# Patient Record
Sex: Male | Born: 1950 | Race: White | Hispanic: No | Marital: Married | Smoking: Never smoker
Health system: Southern US, Community
[De-identification: ages and names within clinical notes are randomized; demographics above are authoritative.]

## PROBLEM LIST (undated history)

## (undated) DIAGNOSIS — T7840XA Allergy, unspecified, initial encounter: Secondary | ICD-10-CM

## (undated) DIAGNOSIS — I10 Essential (primary) hypertension: Secondary | ICD-10-CM

## (undated) DIAGNOSIS — K429 Umbilical hernia without obstruction or gangrene: Secondary | ICD-10-CM

## (undated) DIAGNOSIS — H3320 Serous retinal detachment, unspecified eye: Secondary | ICD-10-CM

## (undated) DIAGNOSIS — E785 Hyperlipidemia, unspecified: Secondary | ICD-10-CM

## (undated) DIAGNOSIS — I255 Ischemic cardiomyopathy: Secondary | ICD-10-CM

## (undated) DIAGNOSIS — Z789 Other specified health status: Secondary | ICD-10-CM

## (undated) DIAGNOSIS — T8859XA Other complications of anesthesia, initial encounter: Secondary | ICD-10-CM

## (undated) DIAGNOSIS — I2510 Atherosclerotic heart disease of native coronary artery without angina pectoris: Secondary | ICD-10-CM

## (undated) DIAGNOSIS — T4145XA Adverse effect of unspecified anesthetic, initial encounter: Secondary | ICD-10-CM

## (undated) DIAGNOSIS — H269 Unspecified cataract: Secondary | ICD-10-CM

## (undated) HISTORY — PX: RETINAL DETACHMENT SURGERY: SHX105

## (undated) HISTORY — DX: Other complications of anesthesia, initial encounter: T88.59XA

## (undated) HISTORY — DX: Unspecified cataract: H26.9

## (undated) HISTORY — DX: Ischemic cardiomyopathy: I25.5

## (undated) HISTORY — DX: Allergy, unspecified, initial encounter: T78.40XA

## (undated) HISTORY — PX: OTHER SURGICAL HISTORY: SHX169

## (undated) HISTORY — DX: Essential (primary) hypertension: I10

## (undated) HISTORY — PX: CARDIAC CATHETERIZATION: SHX172

## (undated) HISTORY — DX: Hyperlipidemia, unspecified: E78.5

## (undated) HISTORY — DX: Serous retinal detachment, unspecified eye: H33.20

## (undated) HISTORY — DX: Adverse effect of unspecified anesthetic, initial encounter: T41.45XA

## (undated) HISTORY — DX: Atherosclerotic heart disease of native coronary artery without angina pectoris: I25.10

---

## 2004-03-01 ENCOUNTER — Encounter: Payer: Self-pay | Admitting: Family Medicine

## 2004-03-01 LAB — CONVERTED CEMR LAB: PSA: 0.6 ng/mL

## 2004-08-15 ENCOUNTER — Ambulatory Visit: Payer: Self-pay | Admitting: Family Medicine

## 2004-08-18 ENCOUNTER — Ambulatory Visit: Payer: Self-pay | Admitting: Family Medicine

## 2004-10-02 ENCOUNTER — Ambulatory Visit: Payer: Self-pay | Admitting: Family Medicine

## 2004-11-16 ENCOUNTER — Ambulatory Visit: Payer: Self-pay | Admitting: Family Medicine

## 2004-11-24 ENCOUNTER — Ambulatory Visit: Payer: Self-pay | Admitting: Family Medicine

## 2005-02-21 ENCOUNTER — Ambulatory Visit: Payer: Self-pay | Admitting: Family Medicine

## 2005-03-02 ENCOUNTER — Ambulatory Visit: Payer: Self-pay | Admitting: Family Medicine

## 2005-04-02 ENCOUNTER — Ambulatory Visit: Payer: Self-pay | Admitting: Family Medicine

## 2006-07-18 ENCOUNTER — Ambulatory Visit: Payer: Self-pay | Admitting: Family Medicine

## 2006-09-30 ENCOUNTER — Emergency Department (HOSPITAL_COMMUNITY): Admission: EM | Admit: 2006-09-30 | Discharge: 2006-09-30 | Payer: Self-pay | Admitting: Emergency Medicine

## 2007-05-26 ENCOUNTER — Telehealth (INDEPENDENT_AMBULATORY_CARE_PROVIDER_SITE_OTHER): Payer: Self-pay | Admitting: *Deleted

## 2007-05-27 ENCOUNTER — Ambulatory Visit: Payer: Self-pay | Admitting: Family Medicine

## 2009-04-25 ENCOUNTER — Ambulatory Visit: Payer: Self-pay | Admitting: Family Medicine

## 2009-04-25 LAB — CONVERTED CEMR LAB
ALT: 22 units/L (ref 0–53)
AST: 21 units/L (ref 0–37)
Albumin: 4.3 g/dL (ref 3.5–5.2)
BUN: 15 mg/dL (ref 6–23)
CO2: 30 meq/L (ref 19–32)
Chloride: 103 meq/L (ref 96–112)
Cholesterol: 234 mg/dL — ABNORMAL HIGH (ref 0–200)
Creatinine, Ser: 1 mg/dL (ref 0.4–1.5)
Direct LDL: 175.3 mg/dL
Glucose, Bld: 101 mg/dL — ABNORMAL HIGH (ref 70–99)
Potassium: 4.1 meq/L (ref 3.5–5.1)
Total Bilirubin: 0.9 mg/dL (ref 0.3–1.2)
Total CHOL/HDL Ratio: 7
Total Protein: 7.1 g/dL (ref 6.0–8.3)

## 2009-04-27 ENCOUNTER — Encounter: Payer: Self-pay | Admitting: Family Medicine

## 2009-04-27 DIAGNOSIS — E785 Hyperlipidemia, unspecified: Secondary | ICD-10-CM | POA: Insufficient documentation

## 2009-04-27 DIAGNOSIS — N400 Enlarged prostate without lower urinary tract symptoms: Secondary | ICD-10-CM | POA: Insufficient documentation

## 2009-04-28 ENCOUNTER — Ambulatory Visit: Payer: Self-pay | Admitting: Family Medicine

## 2012-02-13 ENCOUNTER — Other Ambulatory Visit: Payer: Self-pay | Admitting: Family Medicine

## 2012-02-13 ENCOUNTER — Other Ambulatory Visit (INDEPENDENT_AMBULATORY_CARE_PROVIDER_SITE_OTHER): Payer: BC Managed Care – PPO

## 2012-02-13 DIAGNOSIS — Z0000 Encounter for general adult medical examination without abnormal findings: Secondary | ICD-10-CM

## 2012-02-13 DIAGNOSIS — E785 Hyperlipidemia, unspecified: Secondary | ICD-10-CM

## 2012-02-13 DIAGNOSIS — Z125 Encounter for screening for malignant neoplasm of prostate: Secondary | ICD-10-CM

## 2012-02-13 DIAGNOSIS — Z0001 Encounter for general adult medical examination with abnormal findings: Secondary | ICD-10-CM | POA: Insufficient documentation

## 2012-02-13 LAB — COMPREHENSIVE METABOLIC PANEL
ALT: 24 U/L (ref 0–53)
AST: 19 U/L (ref 0–37)
Calcium: 9.2 mg/dL (ref 8.4–10.5)
Chloride: 104 mEq/L (ref 96–112)
Creatinine, Ser: 1 mg/dL (ref 0.4–1.5)
Sodium: 140 mEq/L (ref 135–145)
Total Bilirubin: 0.9 mg/dL (ref 0.3–1.2)
Total Protein: 6.8 g/dL (ref 6.0–8.3)

## 2012-02-13 LAB — LIPID PANEL
HDL: 45.3 mg/dL (ref >39.00)
Total CHOL/HDL Ratio: 6
Triglycerides: 117 mg/dL (ref 0.0–149.0)

## 2012-02-18 ENCOUNTER — Encounter: Payer: Self-pay | Admitting: Family Medicine

## 2012-02-19 ENCOUNTER — Encounter: Payer: Self-pay | Admitting: Family Medicine

## 2012-02-19 ENCOUNTER — Ambulatory Visit (INDEPENDENT_AMBULATORY_CARE_PROVIDER_SITE_OTHER): Payer: BC Managed Care – PPO | Admitting: Family Medicine

## 2012-02-19 VITALS — BP 124/86 | HR 84 | Temp 98.2°F | Ht 71.0 in | Wt 212.8 lb

## 2012-02-19 DIAGNOSIS — R7309 Other abnormal glucose: Secondary | ICD-10-CM

## 2012-02-19 DIAGNOSIS — Z8249 Family history of ischemic heart disease and other diseases of the circulatory system: Secondary | ICD-10-CM

## 2012-02-19 DIAGNOSIS — Z1211 Encounter for screening for malignant neoplasm of colon: Secondary | ICD-10-CM

## 2012-02-19 DIAGNOSIS — R42 Dizziness and giddiness: Secondary | ICD-10-CM | POA: Insufficient documentation

## 2012-02-19 DIAGNOSIS — R739 Hyperglycemia, unspecified: Secondary | ICD-10-CM | POA: Insufficient documentation

## 2012-02-19 DIAGNOSIS — E785 Hyperlipidemia, unspecified: Secondary | ICD-10-CM

## 2012-02-19 DIAGNOSIS — Z0000 Encounter for general adult medical examination without abnormal findings: Secondary | ICD-10-CM

## 2012-02-19 NOTE — Assessment & Plan Note (Signed)
Isolated episode.  To update me if recurs. Encouraged good hydration status.

## 2012-02-19 NOTE — Assessment & Plan Note (Signed)
Preventative protocols reviewed and updated unless pt declined. Discussed healthy diet and lifestyle.  Declines flu shot. 

## 2012-02-19 NOTE — Assessment & Plan Note (Addendum)
Discussed low sugar diet.  Check A1c next visit. Discussed concern for prediabetes and improtance of weight loss.

## 2012-02-19 NOTE — Progress Notes (Signed)
Subjective:    Patient ID: Martin Garza, male    DOB: [birth year], 61 y.o.   MRN: [id number]  HPI CC: CPE  Dizzy spell the other day - sat down, drank glass of water, and felt better.  Described as lightheaded.  No presyncope or LOC.  Not associated with chest pain, HA, SOB.  Checked pulse, regular and normal.  Prior to dizziness, did walk up stairs.  Daughter with pacemaker.  Finding more trouble dealing with pressure/stress.  Wt Readings from Last 3 Encounters:  02/19/12 212 lb 12 oz (96.503 kg)  04/28/09 212 lb (96.163 kg)  05/27/07 209 lb (94.802 kg)    Preventative: Last CPE was 3 yrs ago. Colon cancer screening - no colonscopy.  Requests stool kit today. Prostate cancer screening - would like to continue. Declines flu shot. Tetanus 2005.  Tdap 08/2011  Strong fam hx CAD.  Caffeine: 3 cups coffee/day, some unsweet tea Lives with wife, 1 dog.  one grown daughter Occupation: Geophysical data processor Activity: no regular exercise.  yardwork. Diet: some water, fruits/vegetables daily, avoids fried foods  Medications and allergies reviewed and updated in chart.  Past histories reviewed and updated if relevant as below. Patient Active Problem List  Diagnosis  . HYPERLIPIDEMIA  . HYPERTROPHY PROSTATE W/O UR OBST & OTH LUTS  . Healthcare maintenance   No past medical history on file. Past Surgical History  Procedure Date  . Left inguinal repair in High School   History  Substance Use Topics  . Smoking status: Never Smoker   . Smokeless tobacco: Never Used  . Alcohol Use: Yes     Glass of wine/day   Family History  Problem Relation Age of Onset  . Cancer Mother 58    metastatic cancer, smoker  . Coronary artery disease Father 33    MI with CABG  . Stroke Father     after catheterization  . Alcohol abuse Father   . Alcohol abuse Brother   . Coronary artery disease Brother 40    CABG  . Depression Brother   . Drug abuse Brother   . Hearing loss Daughter   .  Cancer Maternal Aunt     breast  . Diabetes Neg Hx    No Known Allergies No current outpatient prescriptions on file prior to visit.     Review of Systems  Constitutional: Negative for fever, chills, activity change, appetite change, fatigue and unexpected weight change.  HENT: Negative for hearing loss and neck pain.   Eyes: Negative for visual disturbance.  Respiratory: Negative for cough, chest tightness, shortness of breath and wheezing.   Cardiovascular: Negative for chest pain, palpitations and leg swelling.  Gastrointestinal: Negative for nausea, vomiting, abdominal pain, diarrhea, constipation, blood in stool and abdominal distention.  Genitourinary: Negative for hematuria and difficulty urinating.  Musculoskeletal: Negative for myalgias and arthralgias.  Skin: Negative for rash.  Neurological: Positive for dizziness. Negative for seizures, syncope and headaches.  Hematological: Bruises/bleeds easily (but easily clots).  Psychiatric/Behavioral: Negative for dysphoric mood. The patient is not nervous/anxious.        Objective:   Physical Exam  Nursing note and vitals reviewed. Constitutional: He is oriented to person, place, and time. He appears well-developed and well-nourished. No distress.  HENT:  Head: Normocephalic and atraumatic.  Right Ear: External ear normal.  Left Ear: External ear normal.  Nose: Nose normal.  Mouth/Throat: Oropharynx is clear and moist. No oropharyngeal exudate.  Eyes: Conjunctivae normal and EOM are normal. Pupils are  equal, round, and reactive to light. No scleral icterus.  Neck: Normal range of motion. Neck supple. Carotid bruit is not present.  Cardiovascular: Normal rate, regular rhythm, normal heart sounds and intact distal pulses.   No murmur heard. Pulses:      Radial pulses are 2+ on the right side, and 2+ on the left side.  Pulmonary/Chest: Effort normal and breath sounds normal. No respiratory distress. He has no wheezes. He has no  rales.  Abdominal: Soft. Bowel sounds are normal. He exhibits no distension and no mass. There is no tenderness. There is no rebound and no guarding.  Musculoskeletal: Normal range of motion. He exhibits no edema.  Lymphadenopathy:    He has no cervical adenopathy.  Neurological: He is alert and oriented to person, place, and time.       CN grossly intact, station and gait intact  Skin: Skin is warm and dry. No rash noted.  Psychiatric: He has a normal mood and affect. His behavior is normal. Judgment and thought content normal.       Assessment & Plan:

## 2012-02-19 NOTE — Assessment & Plan Note (Signed)
rec start baby aspirin daily or QOD given fmhx.

## 2012-02-19 NOTE — Assessment & Plan Note (Signed)
Continued elevated.  Discussed low chol diet.  If not improved, start statin given fmhx.

## 2012-02-19 NOTE — Patient Instructions (Addendum)
Chol was too high - Remember - more fruits and vegetables, more fish, less red meat and dairy products.  More soy, nuts, beans, barley, lentils, oats and plant sterol ester enriched margarine instead of butter.  Low cholesterol diet hand out provided today. Sugar was borderline high - possible prediabetes. Start baby aspirin (81mg  enteric coated) daily or every other day. Good to see you today, call us with questions. Stool kit today.

## 2012-05-21 DIAGNOSIS — H3320 Serous retinal detachment, unspecified eye: Secondary | ICD-10-CM

## 2012-05-21 HISTORY — DX: Serous retinal detachment, unspecified eye: H33.20

## 2013-02-25 ENCOUNTER — Ambulatory Visit: Payer: Self-pay | Admitting: Ophthalmology

## 2013-04-02 ENCOUNTER — Ambulatory Visit: Payer: Self-pay | Admitting: Ophthalmology

## 2014-01-23 ENCOUNTER — Other Ambulatory Visit: Payer: Self-pay | Admitting: Family Medicine

## 2014-01-23 DIAGNOSIS — Z125 Encounter for screening for malignant neoplasm of prostate: Secondary | ICD-10-CM

## 2014-01-23 DIAGNOSIS — R739 Hyperglycemia, unspecified: Secondary | ICD-10-CM

## 2014-01-23 DIAGNOSIS — E785 Hyperlipidemia, unspecified: Secondary | ICD-10-CM

## 2014-01-26 ENCOUNTER — Other Ambulatory Visit: Payer: BC Managed Care – PPO

## 2014-01-27 ENCOUNTER — Other Ambulatory Visit (INDEPENDENT_AMBULATORY_CARE_PROVIDER_SITE_OTHER): Payer: Medicare Other

## 2014-01-27 DIAGNOSIS — E785 Hyperlipidemia, unspecified: Secondary | ICD-10-CM

## 2014-01-27 DIAGNOSIS — R7309 Other abnormal glucose: Secondary | ICD-10-CM

## 2014-01-27 DIAGNOSIS — Z125 Encounter for screening for malignant neoplasm of prostate: Secondary | ICD-10-CM

## 2014-01-27 DIAGNOSIS — R739 Hyperglycemia, unspecified: Secondary | ICD-10-CM

## 2014-01-27 LAB — LIPID PANEL
CHOLESTEROL: 255 mg/dL — AB (ref 0–200)
HDL: 38.7 mg/dL — AB (ref >39.00)
LDL CALC: 178 mg/dL — AB (ref 0–99)
NonHDL: 216.3
TRIGLYCERIDES: 194 mg/dL — AB (ref 0.0–149.0)
Total CHOL/HDL Ratio: 7
VLDL: 38.8 mg/dL (ref 0.0–40.0)

## 2014-01-27 LAB — BASIC METABOLIC PANEL
BUN: 21 mg/dL (ref 6–23)
CALCIUM: 8.9 mg/dL (ref 8.4–10.5)
CO2: 27 meq/L (ref 19–32)
Chloride: 105 mEq/L (ref 96–112)
Creatinine, Ser: 1.1 mg/dL (ref 0.4–1.5)
GFR: 75.33 mL/min (ref >60.00)
GLUCOSE: 94 mg/dL (ref 70–99)
Potassium: 4.2 mEq/L (ref 3.5–5.1)
SODIUM: 139 meq/L (ref 135–145)

## 2014-01-27 LAB — PSA: PSA: 0.92 ng/mL (ref 0.10–4.00)

## 2014-01-27 LAB — HEMOGLOBIN A1C: Hgb A1c MFr Bld: 5.6 % (ref 4.6–6.5)

## 2014-01-29 ENCOUNTER — Ambulatory Visit (INDEPENDENT_AMBULATORY_CARE_PROVIDER_SITE_OTHER): Payer: Medicare Other | Admitting: Family Medicine

## 2014-01-29 ENCOUNTER — Encounter: Payer: Self-pay | Admitting: Family Medicine

## 2014-01-29 VITALS — BP 132/78 | HR 92 | Temp 98.2°F | Ht 71.0 in | Wt 214.5 lb

## 2014-01-29 DIAGNOSIS — R739 Hyperglycemia, unspecified: Secondary | ICD-10-CM

## 2014-01-29 DIAGNOSIS — E785 Hyperlipidemia, unspecified: Secondary | ICD-10-CM

## 2014-01-29 DIAGNOSIS — R7309 Other abnormal glucose: Secondary | ICD-10-CM

## 2014-01-29 DIAGNOSIS — Z0000 Encounter for general adult medical examination without abnormal findings: Secondary | ICD-10-CM | POA: Insufficient documentation

## 2014-01-29 DIAGNOSIS — Z125 Encounter for screening for malignant neoplasm of prostate: Secondary | ICD-10-CM

## 2014-01-29 DIAGNOSIS — H3320 Serous retinal detachment, unspecified eye: Secondary | ICD-10-CM | POA: Insufficient documentation

## 2014-01-29 NOTE — Assessment & Plan Note (Signed)
I have personally reviewed the Medicare Annual Wellness questionnaire and have noted 1. The patient's medical and social history 2. Their use of alcohol, tobacco or illicit drugs 3. Their current medications and supplements 4. The patient's functional ability including ADL's, fall risks, home safety risks and hearing or visual impairment. 5. Diet and physical activity 6. Evidence for depression or mood disorders The patients weight, height, BMI have been recorded in the chart.  Hearing and vision has been addressed. I have made referrals, counseling and provided education to the patient based review of the above and I have provided the pt with a written personalized care plan for preventive services. Provider list updated - see scanned questionairre.  Reviewed preventative protocols and updated unless pt declined.  NSR rate 75, normal axis, intervals, no acute ST/T changes, QRS changes anterior leads presumed 2/2 lead placement not old infarct.

## 2014-01-29 NOTE — Progress Notes (Signed)
Pre visit review using our clinic review tool, if applicable. No additional management support is needed unless otherwise documented below in the visit note. 

## 2014-01-29 NOTE — Patient Instructions (Signed)
Stool kit today. Work on diet changes for lower cholesterol. Remember - more fruits and vegetables, more fish, less red meat and dairy products.  More soy, nuts, beans, barley, lentils, oats and plant sterol ester enriched margarine instead of butter. Return for lab visit in 3-6 months to recheck cholesterol levels - if persistently above goal, we may discuss cholesterol medicine given family history Good to see you today, call us with questions. Return as needed or in 1 year for next wellness visit.

## 2014-01-29 NOTE — Assessment & Plan Note (Signed)
Chronically uncontrolled. Pt states he has received low chol diet handout in past. Reviewed diet changes to help lower cholesterol levels. Discussed rtc 6 mo for recheck cholesterol levels and if remainign elevated rec start statin given fmhx.

## 2014-01-29 NOTE — Progress Notes (Addendum)
BP 132/78  Pulse 92  Temp(Src) 98.2 F (36.8 C) (Oral)  Ht _0  (1.803 m)  Wt 214 lb 8 oz (97.297 kg)  BMI 29.93 kg/m2   CC: welcome to medicare visit  Subjective:    Patient ID: Martin Garza, male    DOB: March 15, 1951, 63 y.o.   MRN: [id number]  HPI: Martin Garza is a 63 y.o. male presenting on 01/29/2014 for Annual Exam   Received medicare last month.  Regularly sees eye doctor. Issues with left eye: cataract + detached retina s/p repair x2 Passes hearing screen today Denies falls in last year No mood issues/depression/anhedonia  Preventative: Colon cancer screening - no colonscopy. Requests stool kit today.  Prostate cancer screening - would like to continue.  Declines flu shot Tetanus 2005. Tdap 08/2011  Strong fam hx CAD Advanced directives: addressed Seat belt use discussed Sunscreen use discussed, no suspicious moles  Caffeine: 3 cups coffee/day, some unsweet tea  Lives with wife, 1 dog. one grown daughter  Occupation: Adult nurse  Activity: no regular exercise. yardwork.  Diet: some water, fruits/vegetables daily, avoids fried foods   Relevant past medical, surgical, family and social history reviewed and updated as indicated.  Allergies and medications reviewed and updated. Current Outpatient Prescriptions on File Prior to Visit  Medication Sig  . Multiple Vitamins-Minerals (MULTIVITAMIN GUMMIES ADULT) CHEW Chew by mouth.   No current facility-administered medications on file prior to visit.    Review of Systems  Constitutional: Negative for fever, chills, activity change, appetite change, fatigue and unexpected weight change.  HENT: Negative for hearing loss.   Eyes: Positive for visual disturbance (see HPI).  Respiratory: Negative for cough, chest tightness, shortness of breath and wheezing.   Cardiovascular: Negative for chest pain, palpitations and leg swelling.  Gastrointestinal: Negative for nausea, vomiting, abdominal pain,  diarrhea, constipation, blood in stool and abdominal distention.  Genitourinary: Negative for hematuria and difficulty urinating.  Musculoskeletal: Negative for arthralgias, myalgias and neck pain.  Skin: Negative for rash.  Neurological: Negative for dizziness, seizures, syncope and headaches.  Hematological: Negative for adenopathy. Does not bruise/bleed easily.  Psychiatric/Behavioral: Negative for dysphoric mood. The patient is not nervous/anxious.    Per HPI unless specifically indicated above    Objective:    BP 132/78  Pulse 92  Temp(Src) 98.2 F (36.8 C) (Oral)  Ht _1  (1.803 m)  Wt 214 lb 8 oz (97.297 kg)  BMI 29.93 kg/m2  Physical Exam  Nursing note and vitals reviewed. Constitutional: He is oriented to person, place, and time. He appears well-developed and well-nourished. No distress.  HENT:  Head: Normocephalic and atraumatic.  Right Ear: Hearing, tympanic membrane, external ear and ear canal normal.  Left Ear: Hearing, tympanic membrane, external ear and ear canal normal.  Nose: Nose normal.  Mouth/Throat: Uvula is midline, oropharynx is clear and moist and mucous membranes are normal. No oropharyngeal exudate, posterior oropharyngeal edema or posterior oropharyngeal erythema.  Some thick cerumen in left canal  Eyes: Conjunctivae and EOM are normal. Pupils are equal, round, and reactive to light. No scleral icterus.  Neck: Normal range of motion. Neck supple. Carotid bruit is not present. No thyromegaly present.  Cardiovascular: Normal rate, regular rhythm, normal heart sounds and intact distal pulses.   No murmur heard. Pulses:      Radial pulses are 2+ on the right side, and 2+ on the left side.  Pulmonary/Chest: Effort normal and breath sounds normal. No respiratory distress. He has no wheezes.  He has no rales.  Abdominal: Soft. Bowel sounds are normal. He exhibits no distension and no mass. There is no tenderness. There is no rebound and no guarding.    Genitourinary: Rectum normal and prostate normal. Rectal exam shows no external hemorrhoid, no internal hemorrhoid, no fissure, no mass, no tenderness and anal tone normal. Prostate is not enlarged (15gm) and not tender.  Musculoskeletal: Normal range of motion. He exhibits no edema.  Lymphadenopathy:    He has no cervical adenopathy.  Neurological: He is alert and oriented to person, place, and time.  CN grossly intact, station and gait intact Calculation 3/5 serial 7s, 4/5 serial 3s  Skin: Skin is warm and dry. No rash noted.  Psychiatric: He has a normal mood and affect. His behavior is normal. Judgment and thought content normal.   Results for orders placed in visit on 01/27/14  LIPID PANEL      Result Value Ref Range   Cholesterol 255 (*) 0 - 200 mg/dL   Triglycerides 194.0 (*) 0.0 - 149.0 mg/dL   HDL 38.70 (*) >39.00 mg/dL   VLDL 38.8  0.0 - 40.0 mg/dL   LDL Cholesterol 178 (*) 0 - 99 mg/dL   Total CHOL/HDL Ratio 7     NonHDL 790.24    BASIC METABOLIC PANEL      Result Value Ref Range   Sodium 139  135 - 145 mEq/L   Potassium 4.2  3.5 - 5.1 mEq/L   Chloride 105  96 - 112 mEq/L   CO2 27  19 - 32 mEq/L   Glucose, Bld 94  70 - 99 mg/dL   BUN 21  6 - 23 mg/dL   Creatinine, Ser 1.1  0.4 - 1.5 mg/dL   Calcium 8.9  8.4 - 10.5 mg/dL   GFR 75.33  >60.00 mL/min  PSA      Result Value Ref Range   PSA 0.92  0.10 - 4.00 ng/mL  HEMOGLOBIN A1C      Result Value Ref Range   Hemoglobin A1C 5.6  4.6 - 6.5 %      Assessment & Plan:   Problem List Items Addressed This Visit   Welcome to Medicare preventive visit - Primary     I have personally reviewed the Medicare Annual Wellness questionnaire and have noted 1. The patient's medical and social history 2. Their use of alcohol, tobacco or illicit drugs 3. Their current medications and supplements 4. The patient's functional ability including ADL's, fall risks, home safety risks and hearing or visual impairment. 5. Diet and physical  activity 6. Evidence for depression or mood disorders The patients weight, height, BMI have been recorded in the chart.  Hearing and vision has been addressed. I have made referrals, counseling and provided education to the patient based review of the above and I have provided the pt with a written personalized care plan for preventive services. Provider list updated - see scanned questionairre.  Reviewed preventative protocols and updated unless pt declined.  NSR rate 75, normal axis, intervals, no acute ST/T changes, QRS changes anterior leads presumed 2/2 lead placement not old infarct.    Relevant Orders      EKG 12-Lead (Completed)   HYPERLIPIDEMIA     Chronically uncontrolled. Pt states he has received low chol diet handout in past. Reviewed diet changes to help lower cholesterol levels. Discussed rtc 6 mo for recheck cholesterol levels and if remainign elevated rec start statin given fmhx.     Other Visit  Diagnoses   Special screening for malignant neoplasm of prostate        Relevant Orders       Fecal occult blood, imunochemical    Hyperglycemia            Follow up plan: Return in about 1 year (around 01/30/2015), or as needed, for medicare wellness.

## 2014-01-29 NOTE — Assessment & Plan Note (Signed)
Improved

## 2014-02-03 ENCOUNTER — Ambulatory Visit: Payer: Self-pay | Admitting: Ophthalmology

## 2014-07-24 ENCOUNTER — Other Ambulatory Visit: Payer: Self-pay | Admitting: Family Medicine

## 2014-07-24 DIAGNOSIS — E785 Hyperlipidemia, unspecified: Secondary | ICD-10-CM

## 2014-07-30 ENCOUNTER — Other Ambulatory Visit: Payer: Medicare Other

## 2014-09-10 NOTE — Op Note (Signed)
PATIENT NAME:  Martin Garza, Martin Garza MR#:  [id number] DATE OF BIRTH:  [birth year]  DATE OF PROCEDURE:  04/02/2013  PROCEDURES PERFORMED: 1.  Pars plana vitrectomy of the left eye.  2.  Complex retinal detachment, repair of the left eye.   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  ESTIMATED BLOOD LOSS: Less than 1 mL.   PRIMARY SURGEON: Garlan Fair, M.D.   ANESTHESIA: Retrobulbar block of the left eye with monitored anesthesia care.   COMPLICATIONS: None.   INDICATIONS FOR PROCEDURE: This is a patient who presented to my office five weeks after retinal detachment repair. 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 Risks, benefits, and alternatives of the above procedure were discussed, and the patient wished to proceed.   DETAILS OF PROCEDURE: After informed consent was obtained, the patient was brought to the operative suite at [facility]. The patient was placed in the supine position, was given a small dose of Alfenta, and a retrobulbar block was performed on the left eye by the primary surgeon, without any complications. The left eye was prepped and draped in sterile manner. After lid speculum was inserted, a 25-gauge trocar was placed inferotemporally through displaced conjunctiva in an oblique fashion, 4 mm beyond the limbus. The infusion cannula was turned on and inserted through the trocar and secured in position with Steri-Strips. Two more trocars were placed in a similar fashion superotemporally and superonasally. The vitreous cutter and light pipe were introduced in the eye and the peripheral retina was explored. All retina anterior to the previous laser was attached.  There was a small retinal tear associated with some proliferative vitreoretinopathy inferiorly. Forceps were introduced, and the proliferative vitreoretinopathy began peeling. It became apparent that there was a large sheet that had developed inferiorly, covering most of the inferior retina. This was peeled away. Three tears developed, associated with the peeling. These tears were all marked with Endo cautery. A small draining retinotomy was also created posterior to one of these tears. An air-fluid exchange was performed. The retina flattened. Endolaser was introduced, and four rows of laser were placed around each of the tears. Laser was then carried throughout the inferior retina from the arcade out to the prior laser. Once this was completed, any remnant fluid was removed. 14% C3F8 was used as an Acupuncturist. The trocars were removed, and one trocar site was noted to be leaky, and was closed using interrupted 6-0 plain gut. The eye was pressurized with 14% C3F8 to 15 mmHg. 5 mg of dexamethasone was given into the inferior fornix, and the lid speculum was removed. The eye was cleaned and TobraDex was placed in the eye. A patch and shield were placed over the eye, and the patient was taken to postanesthesia care with instructions to remain face down.     ____________________________ Martin Garza. Martin Manns, MD mfa:cg D: 04/02/2013 17:18:48 ET T: 04/03/2013 03:30:27 ET JOB#: [id number]  cc: Martin Garza. Martin Manns, MD, <Dictator> Coralee Rud MD ELECTRONICALLY SIGNED 04/22/2013 8:37

## 2014-09-10 NOTE — Op Note (Signed)
PATIENT NAME:  Martin Garza, Martin Garza MR#:  [id number] DATE OF BIRTH:  10-Nov-1950  DATE OF PROCEDURE:  02/25/2013  PROCEDURES PERFORMED: 1.  Pars plana vitrectomy of the left eye.  2.  Gas exchange of the left eye.  3.  Endolaser left eye.   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   ESTIMATED BLOOD LOSS:  Less than 1 mL  PRIMARY SURGEON:  Garlan Fair, MD   ANESTHESIA: Retrobulbar block of the left eye with monitored anesthesia care.   COMPLICATIONS:  None.   INDICATIONS FOR PROCEDURE:  The patient presented to my office with a curtain and loss of central vision to 20/60 in the left eye. Examination and OCT confirmed a macula-off rhegmatogenous retinal detachment of the left eye. Risks, benefits and alternatives of the above procedure were discussed and the patient wished to proceed.   DETAILS OF PROCEDURE: After informed consent was obtained, the patient was brought in the operative suite at [facility]. The patient was placed in supine position, was given a small dose of Alfenta and a retrobulbar block was performed in the left eye by the primary surgeon without any complications. The left eye was prepped and draped in sterile manner. After lid speculum was inserted, a 25-gauge trocar was placed inferotemporally through displaced conjunctiva in an oblique fashion 4 mm beyond the limbus in the inferotemporal quadrant. The infusion cannula was turned on and inserted through the trocar and secured in position with Steri-Strips. Two more trocars were placed in a similar fashion superotemporally and superonasally. The vitreous cutter and light pipe were introduced to the eye and a core vitrectomy was performed. Vitreous face was confirmed as already elevated and the peripheral vitreous was trimmed for 360 degrees. A shaved mode was utilized  in order to trim vitreous over the retinal detachment. A single retinal tear was identified superotemporally and was amputated. After trimming of the vitreous, Endo cautery was introduced and the retinal tear was marked. A posterior draining retinotomy was created at approximately 1:30. The soft tip extrusion cannula was introduced and an air-fluid exchange was performed through the posterior draining retinotomy and the retina completely flattened. Endolaser was introduced and 4 rows of laser were placed around the original retinal tear and then the retinal laser was carried for 360 degrees to apply 3 to 4 rows of endolaser posterior to the ora serrata. The soft tip was used to drain any dehydrated remnant fluid through the posterior draining retinotomy and then 4 rows of laser was placed around the posterior draining retinotomy. 22% SF6 was used in air-gas exchange and the trocars were removed. Two of the sclerotomies required closure with 6-0 plain gut. The eye was pressurized with 22% SF6 and was pressurized to a pressure of approximately 15 mmHg. 5 mg of dexamethasone was given into the inferior fornix and the lid speculum was removed. The eye was cleaned and TobraDex was placed in the eye. A patch and shield was placed over the eye and the patient was taken to postanesthesia care with instruction to remain on his left side for 1 hour and then on his right side for the next 5 to 7 days.    ____________________________ Teresa Pelton. Starling Manns, MD mfa:ce D: 02/25/2013 11:27:29 ET T: 02/25/2013 11:39:29 ET JOB#: [id number]  cc: Teresa Pelton. Starling Manns, MD, <Dictator> Coralee Rud MD ELECTRONICALLY SIGNED 03/18/2013 7:08

## 2015-01-24 ENCOUNTER — Other Ambulatory Visit: Payer: Self-pay | Admitting: Family Medicine

## 2015-01-24 DIAGNOSIS — Z125 Encounter for screening for malignant neoplasm of prostate: Secondary | ICD-10-CM

## 2015-01-24 DIAGNOSIS — E785 Hyperlipidemia, unspecified: Secondary | ICD-10-CM

## 2015-01-28 ENCOUNTER — Other Ambulatory Visit (INDEPENDENT_AMBULATORY_CARE_PROVIDER_SITE_OTHER): Payer: Medicare Other

## 2015-01-28 DIAGNOSIS — E785 Hyperlipidemia, unspecified: Secondary | ICD-10-CM

## 2015-01-28 DIAGNOSIS — Z125 Encounter for screening for malignant neoplasm of prostate: Secondary | ICD-10-CM

## 2015-01-28 LAB — TSH: TSH: 1.05 u[IU]/mL (ref 0.35–4.50)

## 2015-01-28 LAB — BASIC METABOLIC PANEL
BUN: 19 mg/dL (ref 6–23)
CALCIUM: 9.5 mg/dL (ref 8.4–10.5)
CO2: 29 mEq/L (ref 19–32)
CREATININE: 1.03 mg/dL (ref 0.40–1.50)
Chloride: 105 mEq/L (ref 96–112)
GFR: 76.78 mL/min (ref >60.00)
GLUCOSE: 103 mg/dL — AB (ref 70–99)
POTASSIUM: 4.7 meq/L (ref 3.5–5.1)
Sodium: 141 mEq/L (ref 135–145)

## 2015-01-28 LAB — LIPID PANEL
CHOL/HDL RATIO: 6
Cholesterol: 245 mg/dL — ABNORMAL HIGH (ref 0–200)
HDL: 39.9 mg/dL (ref >39.00)
LDL CALC: 180 mg/dL — AB (ref 0–99)
NonHDL: 205.06
Triglycerides: 125 mg/dL (ref 0.0–149.0)
VLDL: 25 mg/dL (ref 0.0–40.0)

## 2015-01-28 LAB — PSA, MEDICARE: PSA: 0.72 ng/ml (ref 0.10–4.00)

## 2015-01-31 ENCOUNTER — Encounter: Payer: Self-pay | Admitting: Family Medicine

## 2015-01-31 ENCOUNTER — Ambulatory Visit (INDEPENDENT_AMBULATORY_CARE_PROVIDER_SITE_OTHER): Payer: Medicare Other | Admitting: Family Medicine

## 2015-01-31 VITALS — BP 110/80 | HR 80 | Temp 98.0°F | Ht 71.0 in | Wt 204.5 lb

## 2015-01-31 DIAGNOSIS — E785 Hyperlipidemia, unspecified: Secondary | ICD-10-CM

## 2015-01-31 DIAGNOSIS — Z8249 Family history of ischemic heart disease and other diseases of the circulatory system: Secondary | ICD-10-CM

## 2015-01-31 DIAGNOSIS — Z23 Encounter for immunization: Secondary | ICD-10-CM | POA: Diagnosis not present

## 2015-01-31 DIAGNOSIS — Z1211 Encounter for screening for malignant neoplasm of colon: Secondary | ICD-10-CM

## 2015-01-31 DIAGNOSIS — Z0000 Encounter for general adult medical examination without abnormal findings: Secondary | ICD-10-CM | POA: Diagnosis not present

## 2015-01-31 DIAGNOSIS — Z7189 Other specified counseling: Secondary | ICD-10-CM | POA: Insufficient documentation

## 2015-01-31 NOTE — Patient Instructions (Addendum)
Pass by lab to pick up stool kit.  prevnar today Advanced directive packet provided today. Think about cholesterol medicine.  Nice to see you today, call us with questions. Return as needed or in 1 year for next medicare wellness visit  Health Maintenance A healthy lifestyle and preventative care can promote health and wellness.  Maintain regular health, dental, and eye exams.  Eat a healthy diet. Foods like vegetables, fruits, whole grains, low-fat dairy products, and lean protein foods contain the nutrients you need and are low in calories. Decrease your intake of foods high in solid fats, added sugars, and salt. Get information about a proper diet from your health care provider, if necessary.  Regular physical exercise is one of the most important things you can do for your health. Most adults should get at least 150 minutes of moderate-intensity exercise (any activity that increases your heart rate and causes you to sweat) each week. In addition, most adults need muscle-strengthening exercises on 2 or more days a week.   Maintain a healthy weight. The body mass index (BMI) is a screening tool to identify possible weight problems. It provides an estimate of body fat based on height and weight. Your health care provider can find your BMI and can help you achieve or maintain a healthy weight. For males 20 years and older:  A BMI below 18.5 is considered underweight.  A BMI of 18.5 to 24.9 is normal.  A BMI of 25 to 29.9 is considered overweight.  A BMI of 30 and above is considered obese.  Maintain normal blood lipids and cholesterol by exercising and minimizing your intake of saturated fat. Eat a balanced diet with plenty of fruits and vegetables. Blood tests for lipids and cholesterol should begin at age 79 and be repeated every 5 years. If your lipid or cholesterol levels are high, you are over age 28, or you are at high risk for heart disease, you may need your cholesterol levels  checked more frequently.Ongoing high lipid and cholesterol levels should be treated with medicines if diet and exercise are not working.  If you smoke, find out from your health care provider how to quit. If you do not use tobacco, do not start.  Lung cancer screening is recommended for adults aged 1-80 years who are at high risk for developing lung cancer because of a history of smoking. A yearly low-dose CT scan of the lungs is recommended for people who have at least a 30-pack-year history of smoking and are current smokers or have quit within the past 15 years. A pack year of smoking is smoking an average of 1 pack of cigarettes a day for 1 year (for example, a 30-pack-year history of smoking could mean smoking 1 pack a day for 30 years or 2 packs a day for 15 years). Yearly screening should continue until the smoker has stopped smoking for at least 15 years. Yearly screening should be stopped for people who develop a health problem that would prevent them from having lung cancer treatment.  If you choose to drink alcohol, do not have more than 2 drinks per day. One drink is considered to be 12 oz (360 mL) of beer, 5 oz (150 mL) of wine, or 1.5 oz (45 mL) of liquor.  Avoid the use of street drugs. Do not share needles with anyone. Ask for help if you need support or instructions about stopping the use of drugs.  High blood pressure causes heart disease and increases the  risk of stroke. Blood pressure should be checked at least every 1-2 years. Ongoing high blood pressure should be treated with medicines if weight loss and exercise are not effective.  If you are 41-29 years old, ask your health care provider if you should take aspirin to prevent heart disease.  Diabetes screening involves taking a blood sample to check your fasting blood sugar level. This should be done once every 3 years after age 57 if you are at a normal weight and without risk factors for diabetes. Testing should be considered  at a younger age or be carried out more frequently if you are overweight and have at least 1 risk factor for diabetes.  Colorectal cancer can be detected and often prevented. Most routine colorectal cancer screening begins at the age of 75 and continues through age 13. However, your health care provider may recommend screening at an earlier age if you have risk factors for colon cancer. On a yearly basis, your health care provider may provide home test kits to check for hidden blood in the stool. A small camera at the end of a tube may be used to directly examine the colon (sigmoidoscopy or colonoscopy) to detect the earliest forms of colorectal cancer. Talk to your health care provider about this at age 23 when routine screening begins. A direct exam of the colon should be repeated every 5-10 years through age 48, unless early forms of precancerous polyps or small growths are found.  People who are at an increased risk for hepatitis B should be screened for this virus. You are considered at high risk for hepatitis B if:  You were born in a country where hepatitis B occurs often. Talk with your health care provider about which countries are considered high risk.  Your parents were born in a high-risk country and you have not received a shot to protect against hepatitis B (hepatitis B vaccine).  You have HIV or AIDS.  You use needles to inject street drugs.  You live with, or have sex with, someone who has hepatitis B.  You are a man who has sex with other men (MSM).  You get hemodialysis treatment.  You take certain medicines for conditions like cancer, organ transplantation, and autoimmune conditions.  Hepatitis C blood testing is recommended for all people born from [birth year] through 1965 and any individual with known risk factors for hepatitis C.  Healthy men should no longer receive prostate-specific antigen (PSA) blood tests as part of routine cancer screening. Talk to your health care  provider about prostate cancer screening.  Testicular cancer screening is not recommended for adolescents or adult males who have no symptoms. Screening includes self-exam, a health care provider exam, and other screening tests. Consult with your health care provider about any symptoms you have or any concerns you have about testicular cancer.  Practice safe sex. Use condoms and avoid high-risk sexual practices to reduce the spread of sexually transmitted infections (STIs).  You should be screened for STIs, including gonorrhea and chlamydia if:  You are sexually active and are younger than 24 years.  You are older than 24 years, and your health care provider tells you that you are at risk for this type of infection.  Your sexual activity has changed since you were last screened, and you are at an increased risk for chlamydia or gonorrhea. Ask your health care provider if you are at risk.  If you are at risk of being infected with HIV, it is  recommended that you take a prescription medicine daily to prevent HIV infection. This is called pre-exposure prophylaxis (PrEP). You are considered at risk if:  You are a man who has sex with other men (MSM).  You are a heterosexual man who is sexually active with multiple partners.  You take drugs by injection.  You are sexually active with a partner who has HIV.  Talk with your health care provider about whether you are at high risk of being infected with HIV. If you choose to begin PrEP, you should first be tested for HIV. You should then be tested every 3 months for as long as you are taking PrEP.  Use sunscreen. Apply sunscreen liberally and repeatedly throughout the day. You should seek shade when your shadow is shorter than you. Protect yourself by wearing long sleeves, pants, a wide-brimmed hat, and sunglasses year round whenever you are outdoors.  Tell your health care provider of new moles or changes in moles, especially if there is a change  in shape or color. Also, tell your health care provider if a mole is larger than the size of a pencil eraser.  A one-time screening for abdominal aortic aneurysm (AAA) and surgical repair of large AAAs by ultrasound is recommended for men aged 12-75 years who are current or former smokers.  Stay current with your vaccines (immunizations). Document Released: 11/03/2007 Document Revised: 05/12/2013 Document Reviewed: 10/02/2010 [facility] Patient Information 2015 [place], [place]. This information is not intended to replace advice given to you by your health care provider. Make sure you discuss any questions you have with your health care provider.

## 2015-01-31 NOTE — Assessment & Plan Note (Signed)

## 2015-01-31 NOTE — Assessment & Plan Note (Signed)
Not currently taking aspirin.

## 2015-01-31 NOTE — Assessment & Plan Note (Signed)
Reviewed with patient #s again. Recommended start statin esp given fmhx. Pt hesitant, but will let me know if decides to start medication to control cholesterol levels.

## 2015-01-31 NOTE — Progress Notes (Signed)
Pre visit review using our clinic review tool, if applicable. No additional management support is needed unless otherwise documented below in the visit note. 

## 2015-01-31 NOTE — Assessment & Plan Note (Signed)
Advanced directives - would want wife to be HCPOA. Has not set up. Packet provided today

## 2015-01-31 NOTE — Progress Notes (Signed)
BP 110/80 mmHg  Pulse 80  Temp(Src) 98 F (36.7 C) (Oral)  Ht $R'5\' 11"'Nt$  (1.803 m)  Wt 204 lb 8 oz (92.761 kg)  BMI 28.53 kg/m2   CC: medicare wellness visit  Subjective:    Patient ID: Martin Garza, male    DOB: 03-04-1951, 64 y.o.   MRN: [id number]  HPI: Martin Garza is a 65 y.o. male presenting on 01/31/2015 for Annual Exam   HLD - has tried lipitor and another medication in the past and did not tolerate this.   Regularly sees eye doctor. Issues with left eye: cataract + detached retina s/p repair x2 Passes hearing screen today Denies falls in last year No mood issues/depression/anhedonia  Preventative: Colon cancer screening - no colonscopy. Requests stool kit today.  Prostate cancer screening - would like to continue.  Declines flu shot Tdap 08/2011.  Prevnar - declines zostavax - declines Strong fam hx CAD Advanced directives - would want wife to be HCPOA. Has not set up. Packet provided today Seat belt use discussed Sunscreen use discussed, no suspicious moles  Caffeine: 3 cups coffee/day, some unsweet tea  Lives with wife, 1 dog. one grown daughter  Occupation: Adult nurse  Activity: no regular exercise. yardwork.  Diet: some water, fruits/vegetables daily, avoids fried foods and simple carbs   Relevant past medical, surgical, family and social history reviewed and updated as indicated. Interim medical history since our last visit reviewed. Allergies and medications reviewed and updated. Current Outpatient Prescriptions on File Prior to Visit  Medication Sig  . Multiple Vitamins-Minerals (MULTIVITAMIN GUMMIES ADULT) CHEW Chew by mouth.   No current facility-administered medications on file prior to visit.    Review of Systems Per HPI unless specifically indicated above     Objective:    BP 110/80 mmHg  Pulse 80  Temp(Src) 98 F (36.7 C) (Oral)  Ht $R'5\' 11"'KM$  (1.803 m)  Wt 204 lb 8 oz (92.761 kg)  BMI 28.53 kg/m2  Wt Readings from  Last 3 Encounters:  01/31/15 204 lb 8 oz (92.761 kg)  01/29/14 214 lb 8 oz (97.297 kg)  02/19/12 212 lb 12 oz (96.503 kg)    Physical Exam  Constitutional: He is oriented to person, place, and time. He appears well-developed and well-nourished. No distress.  HENT:  Head: Normocephalic and atraumatic.  Right Ear: Hearing, tympanic membrane, external ear and ear canal normal.  Left Ear: Hearing, tympanic membrane, external ear and ear canal normal.  Nose: Nose normal.  Mouth/Throat: Uvula is midline, oropharynx is clear and moist and mucous membranes are normal. No oropharyngeal exudate, posterior oropharyngeal edema or posterior oropharyngeal erythema.  Eyes: Conjunctivae and EOM are normal. Pupils are equal, round, and reactive to light. No scleral icterus.  Neck: Normal range of motion. Neck supple. Carotid bruit is not present. No thyromegaly present.  Cardiovascular: Normal rate, regular rhythm, normal heart sounds and intact distal pulses.   No murmur heard. Pulses:      Radial pulses are 2+ on the right side, and 2+ on the left side.  Pulmonary/Chest: Effort normal and breath sounds normal. No respiratory distress. He has no wheezes. He has no rales.  Abdominal: Soft. Bowel sounds are normal. He exhibits no distension and no mass. There is no tenderness. There is no rebound and no guarding.  Genitourinary: Rectum normal and prostate normal. Rectal exam shows no external hemorrhoid, no internal hemorrhoid, no fissure, no mass, no tenderness and anal tone normal. Prostate is not enlarged (15gm)  and not tender.  Musculoskeletal: Normal range of motion. He exhibits no edema.  Lymphadenopathy:    He has no cervical adenopathy.  Neurological: He is alert and oriented to person, place, and time.  CN grossly intact, station and gait intact  Skin: Skin is warm and dry. No rash noted.  Psychiatric: He has a normal mood and affect. His behavior is normal. Judgment and thought content normal.    Nursing note and vitals reviewed.  Results for orders placed or performed in visit on 01/28/15  Lipid panel  Result Value Ref Range   Cholesterol 245 (H) 0 - 200 mg/dL   Triglycerides 125.0 0.0 - 149.0 mg/dL   HDL 39.90 >39.00 mg/dL   VLDL 25.0 0.0 - 40.0 mg/dL   LDL Cholesterol 180 (H) 0 - 99 mg/dL   Total CHOL/HDL Ratio 6    NonHDL 205.06   TSH  Result Value Ref Range   TSH 1.05 0.35 - 4.50 uIU/mL  Basic metabolic panel  Result Value Ref Range   Sodium 141 135 - 145 mEq/L   Potassium 4.7 3.5 - 5.1 mEq/L   Chloride 105 96 - 112 mEq/L   CO2 29 19 - 32 mEq/L   Glucose, Bld 103 (H) 70 - 99 mg/dL   BUN 19 6 - 23 mg/dL   Creatinine, Ser 1.03 0.40 - 1.50 mg/dL   Calcium 9.5 8.4 - 10.5 mg/dL   GFR 76.78 >60.00 mL/min  PSA, Medicare  Result Value Ref Range   PSA 0.72 0.10 - 4.00 ng/ml      Assessment & Plan:   Problem List Items Addressed This Visit    HLD (hyperlipidemia)    Reviewed with patient #s again. Recommended start statin esp given fmhx. Pt hesitant, but will let me know if decides to start medication to control cholesterol levels.      Family history of early CAD    Not currently taking aspirin.      Medicare annual wellness visit, initial - Primary    I have personally reviewed the Medicare Annual Wellness questionnaire and have noted 1. The patient's medical and social history 2. Their use of alcohol, tobacco or illicit drugs 3. Their current medications and supplements 4. The patient's functional ability including ADL's, fall risks, home safety risks and hearing or visual impairment. Cognitive function has been assessed and addressed as indicated.  5. Diet and physical activity 6. Evidence for depression or mood disorders The patients weight, height, BMI have been recorded in the chart. I have made referrals, counseling and provided education to the patient based on review of the above and I have provided the pt with a written personalized care plan for  preventive services. Provider list updated.. See scanned questionairre as needed for further documentation. Reviewed preventative protocols and updated unless pt declined.       Advanced care planning/counseling discussion    Advanced directives - would want wife to be HCPOA. Has not set up. Packet provided today       Other Visit Diagnoses    Special screening for malignant neoplasms, colon        Relevant Orders    Fecal occult blood, imunochemical        Follow up plan: Return in about 1 year (around 01/31/2016), or as needed, for medicare wellness.

## 2015-01-31 NOTE — Addendum Note (Signed)
Addended by: Royann Shivers A on: 01/31/2015 05:51 PM   Modules accepted: Orders

## 2016-01-31 ENCOUNTER — Other Ambulatory Visit: Payer: Self-pay | Admitting: Family Medicine

## 2016-01-31 DIAGNOSIS — E785 Hyperlipidemia, unspecified: Secondary | ICD-10-CM

## 2016-01-31 DIAGNOSIS — Z1159 Encounter for screening for other viral diseases: Secondary | ICD-10-CM

## 2016-01-31 DIAGNOSIS — Z125 Encounter for screening for malignant neoplasm of prostate: Secondary | ICD-10-CM

## 2016-02-01 ENCOUNTER — Ambulatory Visit (INDEPENDENT_AMBULATORY_CARE_PROVIDER_SITE_OTHER): Payer: PPO

## 2016-02-01 ENCOUNTER — Other Ambulatory Visit (INDEPENDENT_AMBULATORY_CARE_PROVIDER_SITE_OTHER): Payer: PPO

## 2016-02-01 VITALS — BP 122/80 | HR 61 | Temp 97.7°F | Ht 69.75 in | Wt 210.2 lb

## 2016-02-01 DIAGNOSIS — Z0000 Encounter for general adult medical examination without abnormal findings: Secondary | ICD-10-CM | POA: Diagnosis not present

## 2016-02-01 DIAGNOSIS — Z1159 Encounter for screening for other viral diseases: Secondary | ICD-10-CM | POA: Diagnosis not present

## 2016-02-01 DIAGNOSIS — E785 Hyperlipidemia, unspecified: Secondary | ICD-10-CM

## 2016-02-01 DIAGNOSIS — Z125 Encounter for screening for malignant neoplasm of prostate: Secondary | ICD-10-CM | POA: Diagnosis not present

## 2016-02-01 DIAGNOSIS — Z23 Encounter for immunization: Secondary | ICD-10-CM

## 2016-02-01 LAB — LIPID PANEL
CHOLESTEROL: 262 mg/dL — AB (ref 0–200)
HDL: 44.5 mg/dL (ref >39.00)
LDL Cholesterol: 192 mg/dL — ABNORMAL HIGH (ref 0–99)
NONHDL: 217.35
TRIGLYCERIDES: 126 mg/dL (ref 0.0–149.0)
Total CHOL/HDL Ratio: 6
VLDL: 25.2 mg/dL (ref 0.0–40.0)

## 2016-02-01 LAB — BASIC METABOLIC PANEL
BUN: 20 mg/dL (ref 6–23)
CALCIUM: 9.1 mg/dL (ref 8.4–10.5)
CHLORIDE: 106 meq/L (ref 96–112)
CO2: 28 meq/L (ref 19–32)
CREATININE: 1.02 mg/dL (ref 0.40–1.50)
GFR: 77.41 mL/min (ref >60.00)
GLUCOSE: 106 mg/dL — AB (ref 70–99)
Potassium: 4.6 mEq/L (ref 3.5–5.1)
Sodium: 141 mEq/L (ref 135–145)

## 2016-02-01 LAB — PSA, MEDICARE: PSA: 0.96 ng/ml (ref 0.10–4.00)

## 2016-02-01 NOTE — Progress Notes (Signed)
Pre visit review using our clinic review tool, if applicable. No additional management support is needed unless otherwise documented below in the visit note. 

## 2016-02-01 NOTE — Progress Notes (Signed)
PCP notes:   Health maintenance:  PPSV23 - administered Hep C screening - completed Colon cancer screening - pt will get FOBT kit at [facility] Shingles - postponed/insurance  Abnormal screenings:   Hearing - failed  Patient concerns:   Pt reports intermittent right foot pain when walking. Onset approx. 6 mths ago.  Nurse concerns:  None  Next PCP appt:   02/09/2016 @ 1600

## 2016-02-01 NOTE — Patient Instructions (Addendum)
Martin Garza , Thank you for taking time to come for your Medicare Wellness Visit. I appreciate your ongoing commitment to your health goals. Please review the following plan we discussed and let me know if I can assist you in the future.   These are the goals we discussed: Goals    . Increase physical activity          Starting 02/01/2016, I will continue to walk at least 4 miles daily.        This is a list of the screening recommended for you and due dates:  Health Maintenance  Topic Date Due  . Stool Blood Test  05/20/2016*  . Shingles Vaccine  01/31/2017*  . Flu Shot  01/31/2026*  . Colon Cancer Screening  01/31/2026*  . DTaP/Tdap/Td vaccine (2 - Td) 08/28/2021  . Tetanus Vaccine  08/28/2021  .  Hepatitis C: One time screening is recommended by Center for Disease Control  (CDC) for  adults born from [birth year] through 1967.   Completed  . Pneumonia vaccines  Completed  *Topic was postponed. The date shown is not the original due date.   Preventive Care for Adults  A healthy lifestyle and preventive care can promote health and wellness. Preventive health guidelines for adults include the following key practices.  . A routine yearly physical is a good way to check with your health care provider about your health and preventive screening. It is a chance to share any concerns and updates on your health and to receive a thorough exam.  . Visit your dentist for a routine exam and preventive care every 6 months. Brush your teeth twice a day and floss once a day. Good oral hygiene prevents tooth decay and gum disease.  . The frequency of eye exams is based on your age, health, family medical history, use  of contact lenses, and other factors. Follow your health care provider's ecommendations for frequency of eye exams.  . Eat a healthy diet. Foods like vegetables, fruits, whole grains, low-fat dairy products, and lean protein foods contain the nutrients you need without too many calories.  Decrease your intake of foods high in solid fats, added sugars, and salt. Eat the right amount of calories for you. Get information about a proper diet from your health care provider, if necessary.  . Regular physical exercise is one of the most important things you can do for your health. Most adults should get at least 150 minutes of moderate-intensity exercise (any activity that increases your heart rate and causes you to sweat) each week. In addition, most adults need muscle-strengthening exercises on 2 or more days a week.  Silver Sneakers may be a benefit available to you. To determine eligibility, you may visit the website: www.silversneakers.com or contact program at [phone] Mon-Fri between 8AM-8PM.   . Maintain a healthy weight. The body mass index (BMI) is a screening tool to identify possible weight problems. It provides an estimate of body fat based on height and weight. Your health care provider can find your BMI and can help you achieve or maintain a healthy weight.   For adults 20 years and older: ? A BMI below 18.5 is considered underweight. ? A BMI of 18.5 to 24.9 is normal. ? A BMI of 25 to 29.9 is considered overweight. ? A BMI of 30 and above is considered obese.   . Maintain normal blood lipids and cholesterol levels by exercising and minimizing your intake of saturated fat. Eat a balanced diet  with plenty of fruit and vegetables. Blood tests for lipids and cholesterol should begin at age 65 and be repeated every 5 years. If your lipid or cholesterol levels are high, you are over 50, or you are at high risk for heart disease, you may need your cholesterol levels checked more frequently. Ongoing high lipid and cholesterol levels should be treated with medicines if diet and exercise are not working.  . If you smoke, find out from your health care provider how to quit. If you do not use tobacco, please do not start.  . If you choose to drink alcohol, please do not consume  more than 2 drinks per day. One drink is considered to be 12 ounces (355 mL) of beer, 5 ounces (148 mL) of wine, or 1.5 ounces (44 mL) of liquor.  . If you are 101-35 years old, ask your health care provider if you should take aspirin to prevent strokes.  . Use sunscreen. Apply sunscreen liberally and repeatedly throughout the day. You should seek shade when your shadow is shorter than you. Protect yourself by wearing long sleeves, pants, a wide-brimmed hat, and sunglasses year round, whenever you are outdoors.  . Once a month, do a whole body skin exam, using a mirror to look at the skin on your back. Tell your health care provider of new moles, moles that have irregular borders, moles that are larger than a pencil eraser, or moles that have changed in shape or color.

## 2016-02-01 NOTE — Progress Notes (Signed)
   Subjective:    Patient ID: Martin Garza, male    DOB: 08-20-1950, 65 y.o.   MRN: [id number]  HPI I reviewed health advisor's note, was available for consultation, and agree with documentation and plan.    Review of Systems     Objective:   Physical Exam        Assessment & Plan:

## 2016-02-01 NOTE — Progress Notes (Signed)
Subjective:   Martin Garza is a 65 y.o. male who presents for Medicare Annual/Subsequent preventive examination.  Review of Systems:  N/A Cardiac Risk Factors include: advanced age (>35men, >76 women);male gender;dyslipidemia;obesity (BMI >30kg/m2)     Objective:    Vitals: BP 122/80 (BP Location: Left Arm, Patient Position: Sitting, Cuff Size: Normal)   Pulse 61   Temp 97.7 F (36.5 C) (Oral)   Ht 5' 9.75" (1.772 m) Comment: no shoes  Wt 210 lb 4 oz (95.4 kg)   SpO2 98%   BMI 30.38 kg/m   Body mass index is 30.38 kg/m.  Tobacco History  Smoking Status  . Never Smoker  Smokeless Tobacco  . Never Used     Counseling given: No   Past Medical History:  Diagnosis Date  . Retinal detachment 2014   left   Past Surgical History:  Procedure Laterality Date  . Left inguinal repair  in High School  . RETINAL DETACHMENT SURGERY Left    x 2   Family History  Problem Relation Age of Onset  . Cancer Mother 70    metastatic cancer, smoker  . Coronary artery disease Father 50    MI with CABG, smoker  . Stroke Father     after catheterization  . Alcohol abuse Father   . Alcohol abuse Brother   . Coronary artery disease Brother 31    CABG, smoker  . Depression Brother   . Drug abuse Brother   . Hearing loss Daughter   . Cancer Maternal Aunt     breast  . Diabetes Neg Hx    History  Sexual Activity  . Sexual activity: Yes    Outpatient Encounter Prescriptions as of 02/01/2016  Medication Sig  . Multiple Vitamins-Minerals (MULTIVITAMIN GUMMIES ADULT) CHEW Chew by mouth.   No facility-administered encounter medications on file as of 02/01/2016.     Activities of Daily Living In your present state of health, do you have any difficulty performing the following activities: 02/01/2016  Hearing? N  Vision? Y  Difficulty concentrating or making decisions? N  Walking or climbing stairs? N  Dressing or bathing? N  Doing errands, shopping? N  Preparing Food and  eating ? N  Using the Toilet? N  In the past six months, have you accidently leaked urine? N  Do you have problems with loss of bowel control? N  Managing your Medications? N  Managing your Finances? N  Housekeeping or managing your Housekeeping? N  Some recent data might be hidden    Patient Care Team: Ria Bush, MD as PCP - General (Family Medicine) Leandrew Koyanagi, MD as Referring Physician (Ophthalmology)   Assessment:     Hearing Screening   125Hz  250Hz  500Hz  1000Hz  2000Hz  3000Hz  4000Hz  6000Hz  8000Hz   Right ear:   40 0 40  0    Left ear:   40 40 40  0    Vision Screening Comments: Last vision exam with Dr. Wallace Going in 2017   Exercise Activities and Dietary recommendations Current Exercise Habits: Home exercise routine, Type of exercise: walking (walks 4 miles daily), Time (Minutes): 60, Frequency (Times/Week): 7, Weekly Exercise (Minutes/Week): 420, Intensity: Mild, Exercise limited by: None identified  Goals    . Increase physical activity          Starting 02/01/2016, I will continue to walk at least 4 miles daily.       Fall Risk Fall Risk  02/01/2016 01/31/2015 01/29/2014  Falls in the past year?  No No No   Depression Screen PHQ 2/9 Scores 02/01/2016 01/31/2015 01/29/2014  PHQ - 2 Score 0 0 0    Cognitive Testing MMSE - Mini Mental State Exam 02/01/2016  Orientation to time 5  Orientation to Place 5  Registration 3  Attention/ Calculation 0  Recall 3  Language- name 2 objects 0  Language- repeat 1  Language- follow 3 step command 3  Language- read & follow direction 0  Write a sentence 0  Copy design 0  Total score 20   PLEASE NOTE: A Mini-Cog screen was completed. Maximum score is 20. A value of 0 denotes this part of Folstein MMSE was not completed or the patient failed this part of the Mini-Cog screening.   Mini-Cog Screening Orientation to Time - Max 5 pts Orientation to Place - Max 5 pts Registration - Max 3 pts Recall - Max 3  pts Language Repeat - Max 1 pts Language Follow 3 Step Command - Max 3 pts   Immunization History  Administered Date(s) Administered  . Pneumococcal Conjugate-13 01/31/2015  . Pneumococcal Polysaccharide-23 02/01/2016  . Td 02/24/2004  . Tdap 08/29/2011   Screening Tests Health Maintenance  Topic Date Due  . COLON CANCER SCREENING ANNUAL FOBT  05/20/2016 (Originally 01/04/1999)  . ZOSTAVAX  01/31/2017 (Originally 01/03/2009)  . INFLUENZA VACCINE  01/31/2026 (Originally 12/20/2015)  . COLONOSCOPY  01/31/2026 (Originally 01/04/1999)  . DTaP/Tdap/Td (2 - Td) 08/28/2021  . TETANUS/TDAP  08/28/2021  . Hepatitis C Screening  Completed  . PNA vac Low Risk Adult  Completed      Plan:     I have personally reviewed and addressed the Medicare Annual Wellness questionnaire and have noted the following in the patient's chart:  A. Medical and social history B. Use of alcohol, tobacco or illicit drugs  C. Current medications and supplements D. Functional ability and status E.  Nutritional status F.  Physical activity G. Advance directives H. List of other physicians I.  Hospitalizations, surgeries, and ER visits in previous 12 months J.  [place] to include hearing, vision, cognitive, depression L. Referrals and appointments - none  In addition, I have reviewed and discussed with patient certain preventive protocols, quality metrics, and best practice recommendations. A written personalized care plan for preventive services as well as general preventive health recommendations were provided to patient.  See attached scanned questionnaire for additional information.   Signed,   Lindell Noe, MHA, BS, LPN Health Advisor

## 2016-02-02 LAB — HEPATITIS C ANTIBODY: HCV AB: NEGATIVE

## 2016-02-09 ENCOUNTER — Encounter: Payer: Self-pay | Admitting: Family Medicine

## 2016-02-09 ENCOUNTER — Ambulatory Visit (INDEPENDENT_AMBULATORY_CARE_PROVIDER_SITE_OTHER): Payer: PPO | Admitting: Family Medicine

## 2016-02-09 VITALS — BP 128/80 | HR 76 | Temp 98.3°F | Ht 70.5 in | Wt 215.5 lb

## 2016-02-09 DIAGNOSIS — Z7189 Other specified counseling: Secondary | ICD-10-CM

## 2016-02-09 DIAGNOSIS — E785 Hyperlipidemia, unspecified: Secondary | ICD-10-CM

## 2016-02-09 DIAGNOSIS — Z8249 Family history of ischemic heart disease and other diseases of the circulatory system: Secondary | ICD-10-CM

## 2016-02-09 DIAGNOSIS — M79671 Pain in right foot: Secondary | ICD-10-CM | POA: Insufficient documentation

## 2016-02-09 DIAGNOSIS — Z0001 Encounter for general adult medical examination with abnormal findings: Secondary | ICD-10-CM

## 2016-02-09 DIAGNOSIS — R6889 Other general symptoms and signs: Secondary | ICD-10-CM | POA: Diagnosis not present

## 2016-02-09 DIAGNOSIS — K429 Umbilical hernia without obstruction or gangrene: Secondary | ICD-10-CM | POA: Insufficient documentation

## 2016-02-09 MED ORDER — ASPIRIN EC 81 MG PO TBEC: 81.0000 mg | DELAYED_RELEASE_TABLET | Freq: Every day | ORAL | Status: DC

## 2016-02-09 MED ORDER — ATORVASTATIN CALCIUM 20 MG PO TABS: 20.0000 mg | ORAL_TABLET | Freq: Every day | ORAL | 11 refills | Status: DC

## 2016-02-09 NOTE — Patient Instructions (Addendum)
Pass by lab for stool kit.  Increase water intake.  Work on advanced directives and bring [place] copy when complete.  I recommend starting aspirin '81mg'$  a few times a week. I recommend starting cholesterol medicine atorvastatin '20mg'$  MWF to see if tolerated. I have sent this to your pharmacy.  Watch R foot pain - if not improving with time and rest let me know for xray. Return as needed or in 1 year for physical.   Health Maintenance, Male A healthy lifestyle and preventative care can promote health and wellness.  Maintain regular health, dental, and eye exams.  Eat a healthy diet. Foods like vegetables, fruits, whole grains, low-fat dairy products, and lean protein foods contain the nutrients you need and are low in calories. Decrease your intake of foods high in solid fats, added sugars, and salt. Get information about a proper diet from your health care provider, if necessary.  Regular physical exercise is one of the most important things you can do for your health. Most adults should get at least 150 minutes of moderate-intensity exercise (any activity that increases your heart rate and causes you to sweat) each week. In addition, most adults need muscle-strengthening exercises on 2 or more days a week.   Maintain a healthy weight. The body mass index (BMI) is a screening tool to identify possible weight problems. It provides an estimate of body fat based on height and weight. Your health care provider can find your BMI and can help you achieve or maintain a healthy weight. For males 20 years and older:  A BMI below 18.5 is considered underweight.  A BMI of 18.5 to 24.9 is normal.  A BMI of 25 to 29.9 is considered overweight.  A BMI of 30 and above is considered obese.  Maintain normal blood lipids and cholesterol by exercising and minimizing your intake of saturated fat. Eat a balanced diet with plenty of fruits and vegetables. Blood tests for lipids and cholesterol should begin at age 27  and be repeated every 5 years. If your lipid or cholesterol levels are high, you are over age 29, or you are at high risk for heart disease, you may need your cholesterol levels checked more frequently.Ongoing high lipid and cholesterol levels should be treated with medicines if diet and exercise are not working.  If you smoke, find out from your health care provider how to quit. If you do not use tobacco, do not start.  Lung cancer screening is recommended for adults aged 35-80 years who are at high risk for developing lung cancer because of a history of smoking. A yearly low-dose CT scan of the lungs is recommended for people who have at least a 30-pack-year history of smoking and are current smokers or have quit within the past 15 years. A pack year of smoking is smoking an average of 1 pack of cigarettes a day for 1 year (for example, a 30-pack-year history of smoking could mean smoking 1 pack a day for 30 years or 2 packs a day for 15 years). Yearly screening should continue until the smoker has stopped smoking for at least 15 years. Yearly screening should be stopped for people who develop a health problem that would prevent them from having lung cancer treatment.  If you choose to drink alcohol, do not have more than 2 drinks per day. One drink is considered to be 12 oz (360 mL) of beer, 5 oz (150 mL) of wine, or 1.5 oz (45 mL) of liquor.  Avoid the use of street drugs. Do not share needles with anyone. Ask for help if you need support or instructions about stopping the use of drugs.  High blood pressure causes heart disease and increases the risk of stroke. High blood pressure is more likely to develop in:  People who have blood pressure in the end of the normal range (100-139/85-89 mm Hg).  People who are overweight or obese.  People who are African American.  If you are 37-62 years of age, have your blood pressure checked every 3-5 years. If you are 59 years of age or older, have your  blood pressure checked every year. You should have your blood pressure measured twice--once when you are at a hospital or clinic, and once when you are not at a hospital or clinic. Record the average of the two measurements. To check your blood pressure when you are not at a hospital or clinic, you can use:  An automated blood pressure machine at a pharmacy.  A home blood pressure monitor.  If you are 57-59 years old, ask your health care provider if you should take aspirin to prevent heart disease.  Diabetes screening involves taking a blood sample to check your fasting blood sugar level. This should be done once every 3 years after age 30 if you are at a normal weight and without risk factors for diabetes. Testing should be considered at a younger age or be carried out more frequently if you are overweight and have at least 1 risk factor for diabetes.  Colorectal cancer can be detected and often prevented. Most routine colorectal cancer screening begins at the age of 20 and continues through age 75. However, your health care provider may recommend screening at an earlier age if you have risk factors for colon cancer. On a yearly basis, your health care provider may provide home test kits to check for hidden blood in the stool. A small camera at the end of a tube may be used to directly examine the colon (sigmoidoscopy or colonoscopy) to detect the earliest forms of colorectal cancer. Talk to your health care provider about this at age 61 when routine screening begins. A direct exam of the colon should be repeated every 5-10 years through age 68, unless early forms of precancerous polyps or small growths are found.  People who are at an increased risk for hepatitis B should be screened for this virus. You are considered at high risk for hepatitis B if:  You were born in a country where hepatitis B occurs often. Talk with your health care provider about which countries are considered high risk.  Your  parents were born in a high-risk country and you have not received a shot to protect against hepatitis B (hepatitis B vaccine).  You have HIV or AIDS.  You use needles to inject street drugs.  You live with, or have sex with, someone who has hepatitis B.  You are a man who has sex with other men (MSM).  You get hemodialysis treatment.  You take certain medicines for conditions like cancer, organ transplantation, and autoimmune conditions.  Hepatitis C blood testing is recommended for all people born from [birth year] through 1967 and any individual with known risk factors for hepatitis C.  Healthy men should no longer receive prostate-specific antigen (PSA) blood tests as part of routine cancer screening. Talk to your health care provider about prostate cancer screening.  Testicular cancer screening is not recommended for adolescents or adult males who  have no symptoms. Screening includes self-exam, a health care provider exam, and other screening tests. Consult with your health care provider about any symptoms you have or any concerns you have about testicular cancer.  Practice safe sex. Use condoms and avoid high-risk sexual practices to reduce the spread of sexually transmitted infections (STIs).  You should be screened for STIs, including gonorrhea and chlamydia if:  You are sexually active and are younger than 24 years.  You are older than 24 years, and your health care provider tells you that you are at risk for this type of infection.  Your sexual activity has changed since you were last screened, and you are at an increased risk for chlamydia or gonorrhea. Ask your health care provider if you are at risk.  If you are at risk of being infected with HIV, it is recommended that you take a prescription medicine daily to prevent HIV infection. This is called pre-exposure prophylaxis (PrEP). You are considered at risk if:  You are a man who has sex with other men (MSM).  You are a  heterosexual man who is sexually active with multiple partners.  You take drugs by injection.  You are sexually active with a partner who has HIV.  Talk with your health care provider about whether you are at high risk of being infected with HIV. If you choose to begin PrEP, you should first be tested for HIV. You should then be tested every 3 months for as long as you are taking PrEP.  Use sunscreen. Apply sunscreen liberally and repeatedly throughout the day. You should seek shade when your shadow is shorter than you. Protect yourself by wearing long sleeves, pants, a wide-brimmed hat, and sunglasses year round whenever you are outdoors.  Tell your health care provider of new moles or changes in moles, especially if there is a change in shape or color. Also, tell your health care provider if a mole is larger than the size of a pencil eraser.  A one-time screening for abdominal aortic aneurysm (AAA) and surgical repair of large AAAs by ultrasound is recommended for men aged 23-75 years who are current or former smokers.  Stay current with your vaccines (immunizations).   This information is not intended to replace advice given to you by your health care provider. Make sure you discuss any questions you have with your health care provider.   Document Released: 11/03/2007 Document Revised: 05/28/2014 Document Reviewed: 10/02/2010 Elsevier Interactive Patient Education [employer].

## 2016-02-09 NOTE — Progress Notes (Addendum)
BP 128/80   Pulse 76   Temp 98.3 F (36.8 C) (Oral)   Ht 5' 10.5" (1.791 m)   Wt 215 lb 8 oz (97.8 kg)   SpO2 95%   BMI 30.48 kg/m    CC: CPE Subjective:    Patient ID: Martin Garza, male    DOB: 1950/06/30, 65 y.o.   MRN: [id number]  HPI: Martin Garza is a 65 y.o. male presenting on 02/09/2016 for Annual Exam   Saw Katha Cabal last week for medicare wellness visit. Note reviewed. Ongoing R foot pain over last 6 months. More pain at night time. Points to lateral dorsal foot. No inciting falls or trauma. No redness, warmth or swelling. No numbness or tinglinf of foot. Remote h/o R 4th toe fracture.    Preventative: Colon cancer screening - no colonoscopy. Requests stool kit today.  Prostate cancer screening - would like to continue. Flu shot - declines Prevnar 92016, pneumovax 01/2016 Tdap 08/2011.  zostavax - declines Strong fam hx CAD Advanced directives - would want wife to be HCPOA. Has not set up. Packet provided today Seat belt use discussed Sunscreen use discussed, no suspicious moles Non smoker Alcohol - occasional   Caffeine: 3 cups coffee/day, some unsweet tea  Lives with wife, 1 dog. one grown daughter  Occupation: Adult nurse  Activity: no regular exercise - walks 4 mi/day at work Diet: some water, fruits/vegetables daily, avoids fried foods and simple carbs   Relevant past medical, surgical, family and social history reviewed and updated as indicated. Interim medical history since our last visit reviewed. Allergies and medications reviewed and updated. Current Outpatient Prescriptions on File Prior to Visit  Medication Sig  . Multiple Vitamins-Minerals (MULTIVITAMIN GUMMIES ADULT) CHEW Chew by mouth.   No current facility-administered medications on file prior to visit.     Review of Systems  Constitutional: Negative for activity change, appetite change, chills, fatigue, fever and unexpected weight change.  HENT: Negative for hearing loss.     Eyes: Negative for visual disturbance.  Respiratory: Negative for cough, chest tightness, shortness of breath and wheezing.   Cardiovascular: Negative for chest pain, palpitations and leg swelling.  Gastrointestinal: Negative for abdominal distention, abdominal pain, blood in stool, constipation, diarrhea, nausea and vomiting.  Genitourinary: Negative for difficulty urinating and hematuria.  Musculoskeletal: Negative for arthralgias, myalgias and neck pain.  Skin: Negative for rash.  Neurological: Negative for dizziness, seizures, syncope and headaches.  Hematological: Negative for adenopathy. Bruises/bleeds easily (mild).  Psychiatric/Behavioral: Negative for dysphoric mood. The patient is not nervous/anxious.    Per HPI unless specifically indicated in ROS section     Objective:    BP 128/80   Pulse 76   Temp 98.3 F (36.8 C) (Oral)   Ht 5' 10.5" (1.791 m)   Wt 215 lb 8 oz (97.8 kg)   SpO2 95%   BMI 30.48 kg/m   Wt Readings from Last 3 Encounters:  02/09/16 215 lb 8 oz (97.8 kg)  02/01/16 210 lb 4 oz (95.4 kg)  01/31/15 204 lb 8 oz (92.8 kg)    Physical Exam  Constitutional: He is oriented to person, place, and time. He appears well-developed and well-nourished. No distress.  HENT:  Head: Normocephalic and atraumatic.  Right Ear: Hearing, tympanic membrane, external ear and ear canal normal.  Left Ear: Hearing, tympanic membrane, external ear and ear canal normal.  Nose: Nose normal.  Mouth/Throat: Uvula is midline, oropharynx is clear and moist and mucous membranes are normal.  No oropharyngeal exudate, posterior oropharyngeal edema or posterior oropharyngeal erythema.  Eyes: Conjunctivae and EOM are normal. Pupils are equal, round, and reactive to light. No scleral icterus.  Neck: Normal range of motion. Neck supple. Carotid bruit is not present. No thyromegaly present.  Cardiovascular: Normal rate, regular rhythm, normal heart sounds and intact distal pulses.   No  murmur heard. Pulses:      Radial pulses are 2+ on the right side, and 2+ on the left side.  Pulmonary/Chest: Effort normal and breath sounds normal. No respiratory distress. He has no wheezes. He has no rales.  Abdominal: Soft. Bowel sounds are normal. He exhibits no distension and no mass. There is no tenderness. There is no rebound and no guarding. A hernia (umbilical, nontender and reducible) is present.  Genitourinary: Rectum normal and prostate normal. Rectal exam shows no external hemorrhoid, no internal hemorrhoid, no fissure, no mass, no tenderness and anal tone normal. Prostate is not enlarged (15gm) and not tender.  Musculoskeletal: Normal range of motion. He exhibits no edema.  2+ L DP, 1+ R DP Tender to palpation along 4th MT shaft of right foot, not elsewhere. No swelling or erythema  Lymphadenopathy:    He has no cervical adenopathy.  Neurological: He is alert and oriented to person, place, and time.  CN grossly intact, station and gait intact  Skin: Skin is warm and dry. No rash noted.  Psychiatric: He has a normal mood and affect. His behavior is normal. Judgment and thought content normal.  Nursing note and vitals reviewed.  Results for orders placed or performed in visit on 02/01/16  Hepatitis C antibody  Result Value Ref Range   HCV Ab NEGATIVE NEGATIVE  Lipid panel  Result Value Ref Range   Cholesterol 262 (H) 0 - 200 mg/dL   Triglycerides 126.0 0.0 - 149.0 mg/dL   HDL 44.50 >39.00 mg/dL   VLDL 25.2 0.0 - 40.0 mg/dL   LDL Cholesterol 192 (H) 0 - 99 mg/dL   Total CHOL/HDL Ratio 6    NonHDL 062.69   Basic metabolic panel  Result Value Ref Range   Sodium 141 135 - 145 mEq/L   Potassium 4.6 3.5 - 5.1 mEq/L   Chloride 106 96 - 112 mEq/L   CO2 28 19 - 32 mEq/L   Glucose, Bld 106 (H) 70 - 99 mg/dL   BUN 20 6 - 23 mg/dL   Creatinine, Ser 1.02 0.40 - 1.50 mg/dL   Calcium 9.1 8.4 - 10.5 mg/dL   GFR 77.41 >60.00 mL/min  PSA, Medicare  Result Value Ref Range   PSA  0.96 0.10 - 4.00 ng/ml      Assessment & Plan:   Problem List Items Addressed This Visit    Advanced care planning/counseling discussion    Advanced directives - would want wife to be HCPOA. Has not set up. Packet provided today      Encounter for well adult exam with abnormal findings - Primary    Preventative protocols reviewed and updated unless pt declined. Discussed healthy diet and lifestyle.       Family history of early CAD    Start low dose aspirin MWF, start statin MWF.       HLD (hyperlipidemia)    Chronically elevated off meds.  ASCVD 10 yr rks = 18.4%. Discussed with patient. rec start statin. Pt agrees to trial lipitor 1m MWF and will also start aspirin 861mMWF. Pt will update me if statin not tolerated  Relevant Medications   aspirin EC 81 MG tablet   atorvastatin (LIPITOR) 20 MG tablet   Right foot pain    Tender to palpation at Rth MT shaft. ?stress fracture. Discussed resting foot, elevation, supportive foot wear. If not improved, pt will return for xray. Pt agrees with plan.      Umbilical hernia    Will continue to monitor this.       Other Visit Diagnoses   None.     Follow up plan: Return in about 1 year (around 02/08/2017), or as needed, for annual exam, prior fasting for blood work.  Ria Bush, MD

## 2016-02-09 NOTE — Assessment & Plan Note (Signed)
Will continue to monitor this. 

## 2016-02-09 NOTE — Progress Notes (Signed)
Pre visit review using our clinic review tool, if applicable. No additional management support is needed unless otherwise documented below in the visit note. 

## 2016-02-09 NOTE — Assessment & Plan Note (Signed)
Advanced directives - would want wife to be HCPOA. Has not set up. Packet provided today

## 2016-02-09 NOTE — Assessment & Plan Note (Signed)
Tender to palpation at Rth MT shaft. ?stress fracture. Discussed resting foot, elevation, supportive foot wear. If not improved, pt will return for xray. Pt agrees with plan.

## 2016-02-09 NOTE — Assessment & Plan Note (Signed)
Chronically elevated off meds.  ASCVD 10 yr rks = 18.4%. Discussed with patient. rec start statin. Pt agrees to trial lipitor 20mg  MWF and will also start aspirin 81mg  MWF. Pt will update me if statin not tolerated

## 2016-02-09 NOTE — Assessment & Plan Note (Signed)
Preventative protocols reviewed and updated unless pt declined. Discussed healthy diet and lifestyle.  

## 2016-02-09 NOTE — Assessment & Plan Note (Signed)
Start low dose aspirin MWF, start statin MWF.

## 2016-03-23 DIAGNOSIS — H35352 Cystoid macular degeneration, left eye: Secondary | ICD-10-CM | POA: Diagnosis not present

## 2016-05-21 HISTORY — PX: CATARACT EXTRACTION: SUR2

## 2016-06-13 DIAGNOSIS — H59032 Cystoid macular edema following cataract surgery, left eye: Secondary | ICD-10-CM | POA: Diagnosis not present

## 2016-12-25 DIAGNOSIS — H33022 Retinal detachment with multiple breaks, left eye: Secondary | ICD-10-CM | POA: Diagnosis not present

## 2016-12-25 DIAGNOSIS — H35352 Cystoid macular degeneration, left eye: Secondary | ICD-10-CM | POA: Diagnosis not present

## 2017-01-07 DIAGNOSIS — H2511 Age-related nuclear cataract, right eye: Secondary | ICD-10-CM | POA: Diagnosis not present

## 2017-02-01 ENCOUNTER — Ambulatory Visit: Payer: PPO

## 2017-02-07 ENCOUNTER — Other Ambulatory Visit: Payer: Self-pay | Admitting: Family Medicine

## 2017-02-07 DIAGNOSIS — E785 Hyperlipidemia, unspecified: Secondary | ICD-10-CM

## 2017-02-07 DIAGNOSIS — Z125 Encounter for screening for malignant neoplasm of prostate: Secondary | ICD-10-CM

## 2017-02-08 ENCOUNTER — Ambulatory Visit (INDEPENDENT_AMBULATORY_CARE_PROVIDER_SITE_OTHER): Payer: PPO

## 2017-02-08 VITALS — BP 140/80 | HR 61 | Temp 98.1°F | Ht 69.5 in | Wt 195.5 lb

## 2017-02-08 DIAGNOSIS — Z125 Encounter for screening for malignant neoplasm of prostate: Secondary | ICD-10-CM

## 2017-02-08 DIAGNOSIS — E785 Hyperlipidemia, unspecified: Secondary | ICD-10-CM | POA: Diagnosis not present

## 2017-02-08 DIAGNOSIS — Z0000 Encounter for general adult medical examination without abnormal findings: Secondary | ICD-10-CM | POA: Diagnosis not present

## 2017-02-08 LAB — LIPID PANEL
Cholesterol: 229 mg/dL — ABNORMAL HIGH (ref 0–200)
HDL: 45.8 mg/dL (ref >39.00)
LDL Cholesterol: 164 mg/dL — ABNORMAL HIGH (ref 0–99)
NONHDL: 183.32
Total CHOL/HDL Ratio: 5
Triglycerides: 97 mg/dL (ref 0.0–149.0)
VLDL: 19.4 mg/dL (ref 0.0–40.0)

## 2017-02-08 LAB — COMPREHENSIVE METABOLIC PANEL
ALK PHOS: 49 U/L (ref 39–117)
ALT: 15 U/L (ref 0–53)
AST: 15 U/L (ref 0–37)
Albumin: 4.2 g/dL (ref 3.5–5.2)
BUN: 16 mg/dL (ref 6–23)
CO2: 31 mEq/L (ref 19–32)
Calcium: 9.3 mg/dL (ref 8.4–10.5)
Chloride: 104 mEq/L (ref 96–112)
Creatinine, Ser: 0.96 mg/dL (ref 0.40–1.50)
GFR: 82.76 mL/min (ref >60.00)
GLUCOSE: 101 mg/dL — AB (ref 70–99)
POTASSIUM: 4.7 meq/L (ref 3.5–5.1)
SODIUM: 140 meq/L (ref 135–145)
TOTAL PROTEIN: 6.4 g/dL (ref 6.0–8.3)
Total Bilirubin: 0.6 mg/dL (ref 0.2–1.2)

## 2017-02-08 LAB — PSA, MEDICARE: PSA: 0.97 ng/ml (ref 0.10–4.00)

## 2017-02-08 NOTE — Progress Notes (Signed)
PCP notes:   Health maintenance:  Colon cancer screening - Cologuard ordered Flu vaccine - pt declined  Abnormal screenings:   Hearing - failed  Hearing Screening   125Hz  250Hz  500Hz  1000Hz  2000Hz  3000Hz  4000Hz  6000Hz  8000Hz   Right ear:   40 0 40  0    Left ear:   40 40 40  0     Patient concerns:   None  Nurse concerns:  None  Next PCP appt:   02/11/17 @ 1500

## 2017-02-08 NOTE — Progress Notes (Signed)
Subjective:   Martin Garza is a 66 y.o. male who presents for Medicare Annual/Subsequent preventive examination.  Review of Systems:  N/A Cardiac Risk Factors include: advanced age (>54men, >40 women);dyslipidemia;obesity (BMI >30kg/m2);male gender     Objective:    Vitals: BP 140/80 (BP Location: Right Arm, Patient Position: Sitting, Cuff Size: Normal)   Pulse 61   Temp 98.1 F (36.7 C) (Oral)   Ht 5' 9.5" (1.765 m) Comment: no shoes  Wt 195 lb 8 oz (88.7 kg)   SpO2 98%   BMI 28.46 kg/m   Body mass index is 28.46 kg/m.  Tobacco History  Smoking Status  . Never Smoker  Smokeless Tobacco  . Former Systems developer  . Types: Chew  . Quit date: 05/21/1986     Counseling given: No   Past Medical History:  Diagnosis Date  . Retinal detachment 2014   left   Past Surgical History:  Procedure Laterality Date  . Left inguinal repair  in High School  . RETINAL DETACHMENT SURGERY Left    x 2   Family History  Problem Relation Age of Onset  . Cancer Mother 97       metastatic cancer, smoker  . Coronary artery disease Father 37       MI with CABG, smoker  . Stroke Father        after catheterization  . Alcohol abuse Father   . Alcohol abuse Brother   . Coronary artery disease Brother 71       CABG, smoker  . Depression Brother   . Drug abuse Brother   . Hearing loss Daughter   . Cancer Maternal Aunt        breast  . Diabetes Neg Hx    History  Sexual Activity  . Sexual activity: Yes    Outpatient Encounter Prescriptions as of 02/08/2017  Medication Sig  . Multiple Vitamins-Minerals (MULTIVITAMIN GUMMIES ADULT) CHEW Chew by mouth.  Marland Kitchen atorvastatin (LIPITOR) 20 MG tablet Take 1 tablet (20 mg total) by mouth daily. (Patient not taking: Reported on 02/08/2017)  . [DISCONTINUED] aspirin EC 81 MG tablet Take 1 tablet (81 mg total) by mouth daily.   No facility-administered encounter medications on file as of 02/08/2017.     Activities of Daily Living In your present  state of health, do you have any difficulty performing the following activities: 02/08/2017  Hearing? N  Vision? Y  Comment left eye - retina detachment  Difficulty concentrating or making decisions? N  Walking or climbing stairs? N  Dressing or bathing? N  Doing errands, shopping? N  Preparing Food and eating ? N  Using the Toilet? N  In the past six months, have you accidently leaked urine? N  Do you have problems with loss of bowel control? N  Managing your Medications? N  Managing your Finances? N  Housekeeping or managing your Housekeeping? N  Some recent data might be hidden    Patient Care Team: Ria Bush, MD as PCP - General (Family Medicine) Leandrew Koyanagi, MD as Referring Physician (Ophthalmology)   Assessment:     Hearing Screening   125Hz  250Hz  500Hz  1000Hz  2000Hz  3000Hz  4000Hz  6000Hz  8000Hz   Right ear:   40 0 40  0    Left ear:   40 40 40  0    Vision Screening Comments: Last vision exam in August 2018 @ [facility]; future cataract surgery 03/06/17   Exercise Activities and Dietary recommendations Current Exercise Habits: Home exercise routine,  Type of exercise: walking, Frequency (Times/Week): 6, Intensity: Mild, Exercise limited by: None identified  Goals    . Increase physical activity          Starting 02/08/2017, I will continue to walk at least 3 miles daily.       Fall Risk Fall Risk  02/08/2017 02/01/2016 01/31/2015 01/29/2014  Falls in the past year? No No No No   Depression Screen PHQ 2/9 Scores 02/08/2017 02/01/2016 01/31/2015 01/29/2014  PHQ - 2 Score 0 0 0 0  PHQ- 9 Score 0 - - -    Cognitive Function MMSE - Mini Mental State Exam 02/08/2017 02/01/2016  Orientation to time 5 5  Orientation to Place 5 5  Registration 3 3  Attention/ Calculation 0 0  Recall 3 3  Language- name 2 objects 0 0  Language- repeat 1 1  Language- follow 3 step command 3 3  Language- read & follow direction 0 0  Write a sentence 0 0  Copy  design 0 0  Total score 20 20     PLEASE NOTE: A Mini-Cog screen was completed. Maximum score is 20. A value of 0 denotes this part of Folstein MMSE was not completed or the patient failed this part of the Mini-Cog screening.   Mini-Cog Screening Orientation to Time - Max 5 pts Orientation to Place - Max 5 pts Registration - Max 3 pts Recall - Max 3 pts Language Repeat - Max 1 pts Language Follow 3 Step Command - Max 3 pts     Immunization History  Administered Date(s) Administered  . Pneumococcal Conjugate-13 01/31/2015  . Pneumococcal Polysaccharide-23 02/01/2016  . Td 02/24/2004  . Tdap 08/29/2011   Screening Tests Health Maintenance  Topic Date Due  . Fecal DNA (Cologuard)  02/09/2020 (Originally 01/04/1999)  . INFLUENZA VACCINE  01/31/2026 (Originally 12/19/2016)  . DTaP/Tdap/Td (2 - Td) 08/28/2021  . TETANUS/TDAP  08/28/2021  . Hepatitis C Screening  Completed  . PNA vac Low Risk Adult  Completed      Plan:      I have personally reviewed and addressed the Medicare Annual Wellness questionnaire and have noted the following in the patient's chart:  A. Medical and social history B. Use of alcohol, tobacco or illicit drugs  C. Current medications and supplements D. Functional ability and status E.  Nutritional status F.  Physical activity G. Advance directives H. List of other physicians I.  Hospitalizations, surgeries, and ER visits in previous 12 months J.  [place] to include hearing, vision, cognitive, depression L. Referrals and appointments - none  In addition, I have reviewed and discussed with patient certain preventive protocols, quality metrics, and best practice recommendations. A written personalized care plan for preventive services as well as general preventive health recommendations were provided to patient.  See attached scanned questionnaire for additional information.   Signed,   Lindell Noe, MHA, BS, LPN Health Coach

## 2017-02-08 NOTE — Patient Instructions (Signed)
Martin Garza , Thank you for taking time to come for your Medicare Wellness Visit. I appreciate your ongoing commitment to your health goals. Please review the following plan we discussed and let me know if I can assist you in the future.   These are the goals we discussed: Goals    . Increase physical activity          Starting 02/08/2017, I will continue to walk at least 3 miles daily.        This is a list of the screening recommended for you and due dates:  Health Maintenance  Topic Date Due  . Cologuard (Stool DNA test)  02/09/2020*  . Flu Shot  01/31/2026*  . DTaP/Tdap/Td vaccine (2 - Td) 08/28/2021  . Tetanus Vaccine  08/28/2021  .  Hepatitis C: One time screening is recommended by Center for Disease Control  (CDC) for  adults born from [birth year] through 1967.   Completed  . Pneumonia vaccines  Completed  *Topic was postponed. The date shown is not the original due date.   Preventive Care for Adults  A healthy lifestyle and preventive care can promote health and wellness. Preventive health guidelines for adults include the following key practices.  . A routine yearly physical is a good way to check with your health care provider about your health and preventive screening. It is a chance to share any concerns and updates on your health and to receive a thorough exam.  . Visit your dentist for a routine exam and preventive care every 6 months. Brush your teeth twice a day and floss once a day. Good oral hygiene prevents tooth decay and gum disease.  . The frequency of eye exams is based on your age, health, family medical history, use  of contact lenses, and other factors. Follow your health care provider's ecommendations for frequency of eye exams.  . Eat a healthy diet. Foods like vegetables, fruits, whole grains, low-fat dairy products, and lean protein foods contain the nutrients you need without too many calories. Decrease your intake of foods high in solid fats, added sugars,  and salt. Eat the right amount of calories for you. Get information about a proper diet from your health care provider, if necessary.  . Regular physical exercise is one of the most important things you can do for your health. Most adults should get at least 150 minutes of moderate-intensity exercise (any activity that increases your heart rate and causes you to sweat) each week. In addition, most adults need muscle-strengthening exercises on 2 or more days a week.  Silver Sneakers may be a benefit available to you. To determine eligibility, you may visit the website: www.silversneakers.com or contact program at [phone] Mon-Fri between 8AM-8PM.   . Maintain a healthy weight. The body mass index (BMI) is a screening tool to identify possible weight problems. It provides an estimate of body fat based on height and weight. Your health care provider can find your BMI and can help you achieve or maintain a healthy weight.   For adults 20 years and older: ? A BMI below 18.5 is considered underweight. ? A BMI of 18.5 to 24.9 is normal. ? A BMI of 25 to 29.9 is considered overweight. ? A BMI of 30 and above is considered obese.   . Maintain normal blood lipids and cholesterol levels by exercising and minimizing your intake of saturated fat. Eat a balanced diet with plenty of fruit and vegetables. Blood tests for lipids and cholesterol  should begin at age 55 and be repeated every 5 years. If your lipid or cholesterol levels are high, you are over 50, or you are at high risk for heart disease, you may need your cholesterol levels checked more frequently. Ongoing high lipid and cholesterol levels should be treated with medicines if diet and exercise are not working.  . If you smoke, find out from your health care provider how to quit. If you do not use tobacco, please do not start.  . If you choose to drink alcohol, please do not consume more than 2 drinks per day. One drink is considered to be 12  ounces (355 mL) of beer, 5 ounces (148 mL) of wine, or 1.5 ounces (44 mL) of liquor.  . If you are 55-93 years old, ask your health care provider if you should take aspirin to prevent strokes.  . Use sunscreen. Apply sunscreen liberally and repeatedly throughout the day. You should seek shade when your shadow is shorter than you. Protect yourself by wearing long sleeves, pants, a wide-brimmed hat, and sunglasses year round, whenever you are outdoors.  . Once a month, do a whole body skin exam, using a mirror to look at the skin on your back. Tell your health care provider of new moles, moles that have irregular borders, moles that are larger than a pencil eraser, or moles that have changed in shape or color.

## 2017-02-08 NOTE — Progress Notes (Signed)
Pre visit review using our clinic review tool, if applicable. No additional management support is needed unless otherwise documented below in the visit note. 

## 2017-02-09 NOTE — Progress Notes (Signed)
I reviewed health advisor's note, was available for consultation, and agree with documentation and plan.  

## 2017-02-11 ENCOUNTER — Ambulatory Visit (INDEPENDENT_AMBULATORY_CARE_PROVIDER_SITE_OTHER): Payer: PPO | Admitting: Family Medicine

## 2017-02-11 ENCOUNTER — Encounter: Payer: Self-pay | Admitting: Family Medicine

## 2017-02-11 VITALS — BP 124/58 | HR 60 | Temp 97.7°F | Wt 194.5 lb

## 2017-02-11 DIAGNOSIS — Z8249 Family history of ischemic heart disease and other diseases of the circulatory system: Secondary | ICD-10-CM

## 2017-02-11 DIAGNOSIS — E785 Hyperlipidemia, unspecified: Secondary | ICD-10-CM | POA: Diagnosis not present

## 2017-02-11 DIAGNOSIS — Z0000 Encounter for general adult medical examination without abnormal findings: Secondary | ICD-10-CM | POA: Diagnosis not present

## 2017-02-11 DIAGNOSIS — H3322 Serous retinal detachment, left eye: Secondary | ICD-10-CM | POA: Diagnosis not present

## 2017-02-11 DIAGNOSIS — Z7189 Other specified counseling: Secondary | ICD-10-CM | POA: Diagnosis not present

## 2017-02-11 NOTE — Assessment & Plan Note (Signed)
Advanced directives - would want wife to be HCPOA. Has not set up. Packet provided last year.

## 2017-02-11 NOTE — Progress Notes (Signed)
BP (!) 124/58 (BP Location: Left Arm, Patient Position: Sitting, Cuff Size: Normal)   Pulse 60   Temp 97.7 F (36.5 C) (Oral)   Wt 194 lb 8 oz (88.2 kg)   SpO2 97%   BMI 28.31 kg/m    CC: CPE Subjective:    Patient ID: Martin Garza, male    DOB: [birth year], 66 y.o.   MRN: [id number]  HPI: Martin Garza is a 66 y.o. male presenting on 02/11/2017 for Annual Exam (Pt 2)   Saw Katha Cabal last week for medicare wellness visit. Note reviewed. Cologuard ordered.  Upcoming cataract surgery next month. H/o L retinal detachment 2014.  Strong fam hx CAD  Healthy diet changes over the last 1.5 yrs - ~30lb weight loss since then. Has been trying. Stays active walking regularly.  Preventative: Colon cancer screening - no colonoscopy. Cologuard ordered this year Prostate cancer screening - would like to continue.  Flu shot - declines Prevnar 92016, pneumovax 01/2016 Tdap 08/2011.  zostavax - declines shingrix - discussed, declines  Advanced directives - would want wife to be HCPOA. Has not set up. Packet provided last year.  Seat belt use discussed Sunscreen use discussed, no suspicious moles Non smoker Alcohol - occasional   Caffeine: 3 cups coffee/day, some unsweet tea  Lives with wife, 1 dog. one grown daughter  Occupation: Adult nurse  Activity: no regular exercise - walks 4 mi/day at work Diet: some water, fruits/vegetables daily, avoids fried foods and simple carbs   Relevant past medical, surgical, family and social history reviewed and updated as indicated. Interim medical history since our last visit reviewed. Allergies and medications reviewed and updated. Outpatient Medications Prior to Visit  Medication Sig Dispense Refill  . Multiple Vitamins-Minerals (MULTIVITAMIN GUMMIES ADULT) CHEW Chew by mouth.    Marland Kitchen atorvastatin (LIPITOR) 20 MG tablet Take 1 tablet (20 mg total) by mouth daily. 30 tablet 11   No facility-administered medications prior to visit.        Per HPI unless specifically indicated in ROS section below Review of Systems  Constitutional: Negative for activity change, appetite change, chills, fatigue, fever and unexpected weight change.  HENT: Negative for hearing loss.   Eyes: Negative for visual disturbance.  Respiratory: Negative for cough, chest tightness, shortness of breath and wheezing.   Cardiovascular: Negative for chest pain, palpitations and leg swelling.  Gastrointestinal: Negative for abdominal distention, abdominal pain, blood in stool, constipation, diarrhea, nausea and vomiting.  Genitourinary: Negative for difficulty urinating and hematuria.  Musculoskeletal: Negative for arthralgias, myalgias and neck pain.  Skin: Negative for rash.  Neurological: Negative for dizziness, seizures, syncope and headaches.  Hematological: Negative for adenopathy. Bruises/bleeds easily.  Psychiatric/Behavioral: Negative for dysphoric mood. The patient is not nervous/anxious.        Objective:    BP (!) 124/58 (BP Location: Left Arm, Patient Position: Sitting, Cuff Size: Normal)   Pulse 60   Temp 97.7 F (36.5 C) (Oral)   Wt 194 lb 8 oz (88.2 kg)   SpO2 97%   BMI 28.31 kg/m   Wt Readings from Last 3 Encounters:  02/11/17 194 lb 8 oz (88.2 kg)  02/08/17 195 lb 8 oz (88.7 kg)  02/09/16 215 lb 8 oz (97.8 kg)    Physical Exam  Constitutional: He is oriented to person, place, and time. He appears well-developed and well-nourished. No distress.  HENT:  Head: Normocephalic and atraumatic.  Right Ear: Hearing, tympanic membrane, external ear and ear canal normal.  Left  Ear: Hearing, tympanic membrane, external ear and ear canal normal.  Nose: Nose normal.  Mouth/Throat: Uvula is midline, oropharynx is clear and moist and mucous membranes are normal. No oropharyngeal exudate, posterior oropharyngeal edema or posterior oropharyngeal erythema.  Eyes: Pupils are equal, round, and reactive to light. Conjunctivae and EOM are normal.  No scleral icterus.  Neck: Normal range of motion. Neck supple. No thyromegaly present.  Cardiovascular: Normal rate, regular rhythm, normal heart sounds and intact distal pulses.   No murmur heard. Pulses:      Radial pulses are 2+ on the right side, and 2+ on the left side.  Pulmonary/Chest: Effort normal and breath sounds normal. No respiratory distress. He has no wheezes. He has no rales.  Abdominal: Soft. Bowel sounds are normal. He exhibits no distension and no mass. There is no tenderness. There is no rebound and no guarding.  Genitourinary: Rectum normal and prostate normal. Rectal exam shows no external hemorrhoid, no fissure, no mass, no tenderness and anal tone normal. Prostate is not enlarged (20g) and not tender.  Musculoskeletal: Normal range of motion. He exhibits no edema.  Lymphadenopathy:    He has no cervical adenopathy.  Neurological: He is alert and oriented to person, place, and time.  CN grossly intact, station and gait intact  Skin: Skin is warm and dry. No rash noted.  Psychiatric: He has a normal mood and affect. His behavior is normal. Judgment and thought content normal.  Nursing note and vitals reviewed.  Results for orders placed or performed in visit on 02/08/17  Lipid panel  Result Value Ref Range   Cholesterol 229 (H) 0 - 200 mg/dL   Triglycerides 97.0 0.0 - 149.0 mg/dL   HDL 45.80 >39.00 mg/dL   VLDL 19.4 0.0 - 40.0 mg/dL   LDL Cholesterol 164 (H) 0 - 99 mg/dL   Total CHOL/HDL Ratio 5    NonHDL 183.32   Comprehensive metabolic panel  Result Value Ref Range   Sodium 140 135 - 145 mEq/L   Potassium 4.7 3.5 - 5.1 mEq/L   Chloride 104 96 - 112 mEq/L   CO2 31 19 - 32 mEq/L   Glucose, Bld 101 (H) 70 - 99 mg/dL   BUN 16 6 - 23 mg/dL   Creatinine, Ser 0.96 0.40 - 1.50 mg/dL   Total Bilirubin 0.6 0.2 - 1.2 mg/dL   Alkaline Phosphatase 49 39 - 117 U/L   AST 15 0 - 37 U/L   ALT 15 0 - 53 U/L   Total Protein 6.4 6.0 - 8.3 g/dL   Albumin 4.2 3.5 - 5.2  g/dL   Calcium 9.3 8.4 - 10.5 mg/dL   GFR 82.76 >60.00 mL/min  PSA, Medicare  Result Value Ref Range   PSA 0.97 0.10 - 4.00 ng/ml      Assessment & Plan:   Problem List Items Addressed This Visit    Advanced care planning/counseling discussion    Advanced directives - would want wife to be HCPOA. Has not set up. Packet provided last year.       Family history of early CAD    Declines statin. Stopped aspirin.       Health maintenance examination - Primary    Preventative protocols reviewed and updated unless pt declined. Discussed healthy diet and lifestyle.       HLD (hyperlipidemia)    Chronically elevated, did not start statin. Reviewed risk score. Declines statin at this time.  The 10-year ASCVD risk score Mikey Bussing DC Brooke Bonito.,  et al., 2013) is: 20.5%   Values used to calculate the score:     Age: 89 years     Sex: Male     Is Non-Hispanic African American: No     Diabetic: No     Tobacco smoker: No     Systolic Blood Pressure: 098 mmHg     Is BP treated: No     HDL Cholesterol: 45.8 mg/dL     Total Cholesterol: 229 mg/dL       Retinal detachment    Has established with [place] [facility].           Follow up plan: Return in about 1 year (around 02/11/2018) for annual exam, prior fasting for blood work, medicare wellness visit.  Ria Bush, MD

## 2017-02-11 NOTE — Patient Instructions (Addendum)
Congratulations on healthy diet and weight loss! Consider statin or red yeast rice supplement.  Return as needed or in 1 year for next physical and wellness visit.   Health Maintenance, Male A healthy lifestyle and preventive care is important for your health and wellness. Ask your health care provider about what schedule of regular examinations is right for you. What should I know about weight and diet? Eat a Healthy Diet  Eat plenty of vegetables, fruits, whole grains, low-fat dairy products, and lean protein.  Do not eat a lot of foods high in solid fats, added sugars, or salt.  Maintain a Healthy Weight Regular exercise can help you achieve or maintain a healthy weight. You should:  Do at least 150 minutes of exercise each week. The exercise should increase your heart rate and make you sweat (moderate-intensity exercise).  Do strength-training exercises at least twice a week.  Watch Your Levels of Cholesterol and Blood Lipids  Have your blood tested for lipids and cholesterol every 5 years starting at 68 years of age. If you are at high risk for heart disease, you should start having your blood tested when you are 68 years old. You may need to have your cholesterol levels checked more often if: ? Your lipid or cholesterol levels are high. ? You are older than 68 years of age. ? You are at high risk for heart disease.  What should I know about cancer screening? Many types of cancers can be detected early and may often be prevented. Lung Cancer  You should be screened every year for lung cancer if: ? You are a current smoker who has smoked for at least 30 years. ? You are a former smoker who has quit within the past 15 years.  Talk to your health care provider about your screening options, when you should start screening, and how often you should be screened.  Colorectal Cancer  Routine colorectal cancer screening usually begins at 68 years of age and should be repeated every  5-10 years until you are 68 years old. You may need to be screened more often if early forms of precancerous polyps or small growths are found. Your health care provider may recommend screening at an earlier age if you have risk factors for colon cancer.  Your health care provider may recommend using home test kits to check for hidden blood in the stool.  A small camera at the end of a tube can be used to examine your colon (sigmoidoscopy or colonoscopy). This checks for the earliest forms of colorectal cancer.  Prostate and Testicular Cancer  Depending on your age and overall health, your health care provider may do certain tests to screen for prostate and testicular cancer.  Talk to your health care provider about any symptoms or concerns you have about testicular or prostate cancer.  Skin Cancer  Check your skin from head to toe regularly.  Tell your health care provider about any new moles or changes in moles, especially if: ? There is a change in a mole's size, shape, or color. ? You have a mole that is larger than a pencil eraser.  Always use sunscreen. Apply sunscreen liberally and repeat throughout the day.  Protect yourself by wearing long sleeves, pants, a wide-brimmed hat, and sunglasses when outside.  What should I know about heart disease, diabetes, and high blood pressure?  If you are 35-22 years of age, have your blood pressure checked every 3-5 years. If you are 40 years  of age or older, have your blood pressure checked every year. You should have your blood pressure measured twice-once when you are at a hospital or clinic, and once when you are not at a hospital or clinic. Record the average of the two measurements. To check your blood pressure when you are not at a hospital or clinic, you can use: ? An automated blood pressure machine at a pharmacy. ? A home blood pressure monitor.  Talk to your health care provider about your target blood pressure.  If you are  between 28-42 years old, ask your health care provider if you should take aspirin to prevent heart disease.  Have regular diabetes screenings by checking your fasting blood sugar level. ? If you are at a normal weight and have a low risk for diabetes, have this test once every three years after the age of 51. ? If you are overweight and have a high risk for diabetes, consider being tested at a younger age or more often.  A one-time screening for abdominal aortic aneurysm (AAA) by ultrasound is recommended for men aged 81-75 years who are current or former smokers. What should I know about preventing infection? Hepatitis B If you have a higher risk for hepatitis B, you should be screened for this virus. Talk with your health care provider to find out if you are at risk for hepatitis B infection. Hepatitis C Blood testing is recommended for:  Everyone born from [birth year] through 1965.  Anyone with known risk factors for hepatitis C.  Sexually Transmitted Diseases (STDs)  You should be screened each year for STDs including gonorrhea and chlamydia if: ? You are sexually active and are younger than 68 years of age. ? You are older than 68 years of age and your health care provider tells you that you are at risk for this type of infection. ? Your sexual activity has changed since you were last screened and you are at an increased risk for chlamydia or gonorrhea. Ask your health care provider if you are at risk.  Talk with your health care provider about whether you are at high risk of being infected with HIV. Your health care provider may recommend a prescription medicine to help prevent HIV infection.  What else can I do?  Schedule regular health, dental, and eye exams.  Stay current with your vaccines (immunizations).  Do not use any tobacco products, such as cigarettes, chewing tobacco, and e-cigarettes. If you need help quitting, ask your health care provider.  Limit alcohol intake to no  more than 2 drinks per day. One drink equals 12 ounces of beer, 5 ounces of wine, or 1 ounces of hard liquor.  Do not use street drugs.  Do not share needles.  Ask your health care provider for help if you need support or information about quitting drugs.  Tell your health care provider if you often feel depressed.  Tell your health care provider if you have ever been abused or do not feel safe at home. This information is not intended to replace advice given to you by your health care provider. Make sure you discuss any questions you have with your health care provider. Document Released: 11/03/2007 Document Revised: 01/04/2016 Document Reviewed: 02/08/2015 Elsevier Interactive Patient Education  [employer].

## 2017-02-11 NOTE — Assessment & Plan Note (Signed)
Preventative protocols reviewed and updated unless pt declined. Discussed healthy diet and lifestyle.  

## 2017-02-11 NOTE — Assessment & Plan Note (Addendum)
Chronically elevated, did not start statin. Reviewed risk score. Declines statin at this time.  The 10-year ASCVD risk score Martin Garza., et al., 2013) is: 20.5%   Values used to calculate the score:     Age: 66 years     Sex: Male     Is Non-Hispanic African American: No     Diabetic: No     Tobacco smoker: No     Systolic Blood Pressure: 623 mmHg     Is BP treated: No     HDL Cholesterol: 45.8 mg/dL     Total Cholesterol: 229 mg/dL

## 2017-02-11 NOTE — Assessment & Plan Note (Signed)
Has established with [facility].

## 2017-02-11 NOTE — Assessment & Plan Note (Signed)
Declines statin. Stopped aspirin.

## 2017-03-06 DIAGNOSIS — H25811 Combined forms of age-related cataract, right eye: Secondary | ICD-10-CM | POA: Diagnosis not present

## 2017-03-06 DIAGNOSIS — H2511 Age-related nuclear cataract, right eye: Secondary | ICD-10-CM | POA: Diagnosis not present

## 2017-05-07 ENCOUNTER — Telehealth: Payer: Self-pay

## 2017-05-07 DIAGNOSIS — H43811 Vitreous degeneration, right eye: Secondary | ICD-10-CM | POA: Diagnosis not present

## 2017-05-07 NOTE — Telephone Encounter (Signed)
Spoke with pt informing him I did not see where anyone from this office called. Says he found out it was an automated call reminding him to schedule wellness visit, which he says he already did.

## 2017-05-07 NOTE — Telephone Encounter (Signed)
Copied from [place] [id number]. Topic: Quick Communication - Office Called Patient >> May 07, 2017  9:53 AM Burnis Medin, NT wrote: Reason for CRM: Pt. Called back and CRM not noted. >> May 07, 2017 10:12 AM Lurlean Nanny, [place], LPN wrote: See CRM, no record of patient being call.  Did you call patient and if not let him know we have no record of our office calling him.

## 2017-05-28 DIAGNOSIS — H4311 Vitreous hemorrhage, right eye: Secondary | ICD-10-CM | POA: Diagnosis not present

## 2017-07-09 DIAGNOSIS — H26492 Other secondary cataract, left eye: Secondary | ICD-10-CM | POA: Diagnosis not present

## 2017-09-04 DIAGNOSIS — H26491 Other secondary cataract, right eye: Secondary | ICD-10-CM | POA: Diagnosis not present

## 2017-09-04 DIAGNOSIS — H26492 Other secondary cataract, left eye: Secondary | ICD-10-CM | POA: Diagnosis not present

## 2017-10-01 DIAGNOSIS — H35372 Puckering of macula, left eye: Secondary | ICD-10-CM | POA: Diagnosis not present

## 2017-10-01 DIAGNOSIS — H33022 Retinal detachment with multiple breaks, left eye: Secondary | ICD-10-CM | POA: Diagnosis not present

## 2017-10-01 DIAGNOSIS — H35352 Cystoid macular degeneration, left eye: Secondary | ICD-10-CM | POA: Diagnosis not present

## 2017-10-01 DIAGNOSIS — H43811 Vitreous degeneration, right eye: Secondary | ICD-10-CM | POA: Diagnosis not present

## 2018-01-14 DIAGNOSIS — Z1211 Encounter for screening for malignant neoplasm of colon: Secondary | ICD-10-CM | POA: Diagnosis not present

## 2018-01-14 DIAGNOSIS — Z1212 Encounter for screening for malignant neoplasm of rectum: Secondary | ICD-10-CM | POA: Diagnosis not present

## 2018-01-14 LAB — COLOGUARD: Cologuard: POSITIVE

## 2018-01-22 ENCOUNTER — Telehealth: Payer: Self-pay | Admitting: Family Medicine

## 2018-01-22 ENCOUNTER — Encounter: Payer: Self-pay | Admitting: Family Medicine

## 2018-01-22 DIAGNOSIS — R195 Other fecal abnormalities: Secondary | ICD-10-CM

## 2018-01-22 NOTE — Telephone Encounter (Signed)
plz notify cologuard test returned positive for blood or higher risk DNA marker.  Recommend we proceed with colonoscopy for further evaluation - I have placed referral.

## 2018-01-22 NOTE — Telephone Encounter (Signed)
Spoke with pt relaying results and Dr. G's message.  Pt verbalizes understanding.  

## 2018-02-13 ENCOUNTER — Ambulatory Visit: Payer: PPO

## 2018-02-13 ENCOUNTER — Other Ambulatory Visit: Payer: Self-pay | Admitting: Family Medicine

## 2018-02-13 ENCOUNTER — Ambulatory Visit (INDEPENDENT_AMBULATORY_CARE_PROVIDER_SITE_OTHER): Payer: PPO

## 2018-02-13 VITALS — BP 138/70 | HR 77 | Temp 97.7°F | Ht 70.5 in | Wt 199.5 lb

## 2018-02-13 DIAGNOSIS — Z125 Encounter for screening for malignant neoplasm of prostate: Secondary | ICD-10-CM

## 2018-02-13 DIAGNOSIS — E785 Hyperlipidemia, unspecified: Secondary | ICD-10-CM | POA: Diagnosis not present

## 2018-02-13 DIAGNOSIS — Z0000 Encounter for general adult medical examination without abnormal findings: Secondary | ICD-10-CM | POA: Diagnosis not present

## 2018-02-13 LAB — LIPID PANEL
CHOL/HDL RATIO: 5
Cholesterol: 219 mg/dL — ABNORMAL HIGH (ref 0–200)
HDL: 40.8 mg/dL (ref >39.00)
NONHDL: 178.59
Triglycerides: 285 mg/dL — ABNORMAL HIGH (ref 0.0–149.0)
VLDL: 57 mg/dL — AB (ref 0.0–40.0)

## 2018-02-13 LAB — PSA: PSA: 1.33 ng/mL (ref 0.10–4.00)

## 2018-02-13 LAB — COMPREHENSIVE METABOLIC PANEL
ALT: 15 U/L (ref 0–53)
AST: 14 U/L (ref 0–37)
Albumin: 4.3 g/dL (ref 3.5–5.2)
Alkaline Phosphatase: 53 U/L (ref 39–117)
BUN: 15 mg/dL (ref 6–23)
CHLORIDE: 105 meq/L (ref 96–112)
CO2: 29 meq/L (ref 19–32)
Calcium: 9.4 mg/dL (ref 8.4–10.5)
Creatinine, Ser: 1.03 mg/dL (ref 0.40–1.50)
GFR: 76.08 mL/min (ref >60.00)
GLUCOSE: 95 mg/dL (ref 70–99)
POTASSIUM: 4.5 meq/L (ref 3.5–5.1)
SODIUM: 141 meq/L (ref 135–145)
TOTAL PROTEIN: 6.7 g/dL (ref 6.0–8.3)
Total Bilirubin: 0.5 mg/dL (ref 0.2–1.2)

## 2018-02-13 LAB — LDL CHOLESTEROL, DIRECT: LDL DIRECT: 160 mg/dL

## 2018-02-13 NOTE — Patient Instructions (Signed)
Mr. Hietala , Thank you for taking time to come for your Medicare Wellness Visit. I appreciate your ongoing commitment to your health goals. Please review the following plan we discussed and let me know if I can assist you in the future.   These are the goals we discussed: Goals    . Increase physical activity     Starting 02/13/2018, I will continue to walk at least 3 miles daily.        This is a list of the screening recommended for you and due dates:  Health Maintenance  Topic Date Due  . Flu Shot  02/13/2049*  . Cologuard (Stool DNA test)  01/14/2021  . DTaP/Tdap/Td vaccine (2 - Td) 08/28/2021  . Tetanus Vaccine  08/28/2021  .  Hepatitis C: One time screening is recommended by Center for Disease Control  (CDC) for  adults born from [birth year] through 1965.   Completed  . Pneumonia vaccines  Completed  *Topic was postponed. The date shown is not the original due date.   Preventive Care for Adults  A healthy lifestyle and preventive care can promote health and wellness. Preventive health guidelines for adults include the following key practices.  . A routine yearly physical is a good way to check with your health care provider about your health and preventive screening. It is a chance to share any concerns and updates on your health and to receive a thorough exam.  . Visit your dentist for a routine exam and preventive care every 6 months. Brush your teeth twice a day and floss once a day. Good oral hygiene prevents tooth decay and gum disease.  . The frequency of eye exams is based on your age, health, family medical history, use  of contact lenses, and other factors. Follow your health care provider's recommendations for frequency of eye exams.  . Eat a healthy diet. Foods like vegetables, fruits, whole grains, low-fat dairy products, and lean protein foods contain the nutrients you need without too many calories. Decrease your intake of foods high in solid fats, added sugars, and  salt. Eat the right amount of calories for you. Get information about a proper diet from your health care provider, if necessary.  . Regular physical exercise is one of the most important things you can do for your health. Most adults should get at least 150 minutes of moderate-intensity exercise (any activity that increases your heart rate and causes you to sweat) each week. In addition, most adults need muscle-strengthening exercises on 2 or more days a week.  Silver Sneakers may be a benefit available to you. To determine eligibility, you may visit the website: www.silversneakers.com or contact program at (202)264-8937 Mon-Fri between 8AM-8PM.   . Maintain a healthy weight. The body mass index (BMI) is a screening tool to identify possible weight problems. It provides an estimate of body fat based on height and weight. Your health care provider can find your BMI and can help you achieve or maintain a healthy weight.   For adults 20 years and older: ? A BMI below 18.5 is considered underweight. ? A BMI of 18.5 to 24.9 is normal. ? A BMI of 25 to 29.9 is considered overweight. ? A BMI of 30 and above is considered obese.   . Maintain normal blood lipids and cholesterol levels by exercising and minimizing your intake of saturated fat. Eat a balanced diet with plenty of fruit and vegetables. Blood tests for lipids and cholesterol should begin at age 41  and be repeated every 5 years. If your lipid or cholesterol levels are high, you are over 50, or you are at high risk for heart disease, you may need your cholesterol levels checked more frequently. Ongoing high lipid and cholesterol levels should be treated with medicines if diet and exercise are not working.  . If you smoke, find out from your health care provider how to quit. If you do not use tobacco, please do not start.  . If you choose to drink alcohol, please do not consume more than 2 drinks per day. One drink is considered to be 12 ounces  (355 mL) of beer, 5 ounces (148 mL) of wine, or 1.5 ounces (44 mL) of liquor.  . If you are 72-69 years old, ask your health care provider if you should take aspirin to prevent strokes.  . Use sunscreen. Apply sunscreen liberally and repeatedly throughout the day. You should seek shade when your shadow is shorter than you. Protect yourself by wearing long sleeves, pants, a wide-brimmed hat, and sunglasses year round, whenever you are outdoors.  . Once a month, do a whole body skin exam, using a mirror to look at the skin on your back. Tell your health care provider of new moles, moles that have irregular borders, moles that are larger than a pencil eraser, or moles that have changed in shape or color.

## 2018-02-13 NOTE — Progress Notes (Signed)
PCP notes:   Health maintenance:  Flu vaccine - pt declined  Abnormal screenings:   Hearing - failed  Hearing Screening   125Hz  250Hz  500Hz  1000Hz  2000Hz  3000Hz  4000Hz  6000Hz  8000Hz   Right ear:   40 40 40  40    Left ear:   40 40 40  0     Patient concerns:   None  Nurse concerns:  None  Next PCP appt:   02/14/18 @ 1500 I reviewed health advisor's note, was available for consultation on the day of service listed in this note, and agree with documentation and plan. Elsie Stain, MD.

## 2018-02-13 NOTE — Progress Notes (Signed)
Subjective:   Martin Garza is a 67 y.o. male who presents for Medicare Annual (Subsequent) preventive examination.  Review of Systems:  N/A Cardiac Risk Factors include: advanced age (>59men, >75 women);dyslipidemia;male gender     Objective:     Vitals: BP 138/70 (BP Location: Right Arm, Patient Position: Sitting, Cuff Size: Normal)   Pulse 77   Temp 97.7 F (36.5 C) (Oral)   Ht 5' 10.5" (1.791 m) Comment: shoes  Wt 199 lb 8 oz (90.5 kg)   SpO2 96%   BMI 28.22 kg/m   Body mass index is 28.22 kg/m.  Advanced Directives 02/13/2018 02/08/2017 02/01/2016  Does Patient Have a Medical Advance Directive? Yes Yes No  Type of Paramedic of [place];Living will [place];Living will -  Copy of [place] in Chart? No - copy requested No - copy requested -  Would patient like information on creating a medical advance directive? - - Yes - Educational materials given    Tobacco Social History   Tobacco Use  Smoking Status Never Smoker  Smokeless Tobacco Former Systems developer  . Types: Chew     Counseling given: No   Clinical Intake:  Pre-visit preparation completed: Yes  Pain : No/denies pain Pain Score: 0-No pain     Nutritional Status: BMI 25 -29 Overweight Nutritional Risks: None Diabetes: No  How often do you need to have someone help you when you read instructions, pamphlets, or other written materials from your doctor or pharmacy?: 1 - Never What is the last grade level you completed in school?: 12th grade + 1 yr college  Interpreter Needed?: No  Comments: pt lives with spouse Information entered by :: LPinson, LPN  Past Medical History:  Diagnosis Date  . Retinal detachment 2014   left   Past Surgical History:  Procedure Laterality Date  . Left inguinal repair  in High School  . RETINAL DETACHMENT SURGERY Left    x 2   Family History  Problem Relation Age of Onset  . Cancer Mother 64   metastatic cancer, smoker  . Coronary artery disease Father 59       MI with CABG, smoker  . Stroke Father        after catheterization  . Alcohol abuse Father   . Alcohol abuse Brother   . Coronary artery disease Brother 22       CABG, smoker  . Depression Brother   . Drug abuse Brother   . Hearing loss Daughter   . Cancer Maternal Aunt        breast  . Diabetes Neg Hx    Social History   Socioeconomic History  . Marital status: Married    Spouse name: Not on file  . Number of children: 1  . Years of education: Not on file  . Highest education level: Not on file  Occupational History  . 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 Financial resource strain: Not on file  . Food insecurity:    Worry: Not on file    Inability: Not on file  . Transportation needs:    Medical: Not on file    Non-medical: Not on file  Tobacco Use  . Smoking status: Never Smoker  . Smokeless tobacco: Former Systems developer    Types: Chew  Substance and Sexual Activity  . Alcohol use: Yes    Alcohol/week: 1.0 standard drinks  Types: 1 Glasses of wine per week  . Drug use: No  . Sexual activity: Yes  Lifestyle  . Physical activity:    Days per week: Not on file    Minutes per session: Not on file  . Stress: Not on file  Relationships  . Social connections:    Talks on phone: Not on file    Gets together: Not on file    Attends religious service: Not on file    Active member of club or organization: Not on file    Attends meetings of clubs or organizations: Not on file    Relationship status: Not on file  Other Topics Concern  . Not on file  Social History Narrative   Caffeine: 3 cups coffee/day, some unsweet tea   Lives with wife, 1 dog.  one grown daughter   Occupation: Adult nurse   Activity: no regular exercise.  yardwork.   Diet: some water, fruits/vegetables daily, avoids fried foods    Outpatient Encounter Medications as of  02/13/2018  Medication Sig  . Cyanocobalamin (B-12 PO) Take 1,000 mcg by mouth daily.  . [DISCONTINUED] Multiple Vitamins-Minerals (MULTIVITAMIN GUMMIES ADULT) CHEW Chew by mouth.   No facility-administered encounter medications on file as of 02/13/2018.     Activities of Daily Living In your present state of health, do you have any difficulty performing the following activities: 02/13/2018  Hearing? N  Vision? Y  Comment left eye detached retina  Difficulty concentrating or making decisions? N  Walking or climbing stairs? N  Dressing or bathing? N  Doing errands, shopping? N  Preparing Food and eating ? N  Using the Toilet? N  In the past six months, have you accidently leaked urine? N  Do you have problems with loss of bowel control? N  Managing your Medications? N  Managing your Finances? N  Housekeeping or managing your Housekeeping? N  Some recent data might be hidden    Patient Care Team: Ria Bush, MD as PCP - General (Family Medicine) Leandrew Koyanagi, MD as Referring Physician (Ophthalmology)    Assessment:   This is a routine wellness examination for Martin Garza.   Hearing Screening   125Hz  250Hz  500Hz  1000Hz  2000Hz  3000Hz  4000Hz  6000Hz  8000Hz   Right ear:   40 40 40  40    Left ear:   40 40 40  0    Vision Screening Comments: [facility] exam in 2019 with [facility]    Exercise Activities and Dietary recommendations Current Exercise Habits: The patient has a physically strenous job, but has no regular exercise apart from work.(works 22 hours/wk), Exercise limited by: None identified  Goals    . Increase physical activity     Starting 02/13/2018, I will continue to walk at least 3 miles daily.        Fall Risk Fall Risk  02/13/2018 02/08/2017 02/01/2016 01/31/2015 01/29/2014  Falls in the past year? No No No No No   Depression Screen PHQ 2/9 Scores 02/13/2018 02/08/2017 02/01/2016 01/31/2015  PHQ - 2 Score 0 0 0 0  PHQ- 9 Score 0 0 - -      Cognitive Function MMSE - Mini Mental State Exam 02/13/2018 02/08/2017 02/01/2016  Orientation to time 5 5 5   Orientation to Place 5 5 5   Registration 3 3 3   Attention/ Calculation 0 0 0  Recall 3 3 3   Language- name 2 objects 0 0 0  Language- repeat 1 1 1   Language- follow 3 step command 3 3  3  Language- read & follow direction 0 0 0  Write a sentence 0 0 0  Copy design 0 0 0  Total score 20 20 20        PLEASE NOTE: A Mini-Cog screen was completed. Maximum score is 20. A value of 0 denotes this part of [place] MMSE was not completed or the patient failed this part of the Mini-Cog screening.   Mini-Cog Screening Orientation to Time - Max 5 pts Orientation to Place - Max 5 pts Registration - Max 3 pts Recall - Max 3 pts Language Repeat - Max 1 pts Language Follow 3 Step Command - Max 3 pts   Immunization History  Administered Date(s) Administered  . Pneumococcal Conjugate-13 01/31/2015  . Pneumococcal Polysaccharide-23 02/01/2016  . Td 02/24/2004  . Tdap 08/29/2011    Screening Tests Health Maintenance  Topic Date Due  . INFLUENZA VACCINE  02/13/2049 (Originally 12/19/2017)  . Fecal DNA (Cologuard)  01/14/2021  . DTaP/Tdap/Td (2 - Td) 08/28/2021  . TETANUS/TDAP  08/28/2021  . Hepatitis C Screening  Completed  . PNA vac Low Risk Adult  Completed      Plan:     I have personally reviewed, addressed, and noted the following in the patient's chart:  A. Medical and social history B. Use of alcohol, tobacco or illicit drugs  C. Current medications and supplements D. Functional ability and status E.  Nutritional status F.  Physical activity G. Advance directives H. List of other physicians I.  Hospitalizations, surgeries, and ER visits in previous 12 months J.  [place] to include hearing, vision, cognitive, depression L. Referrals and appointments - none  In addition, I have reviewed and discussed with patient certain preventive protocols, quality  metrics, and best practice recommendations. A written personalized care plan for preventive services as well as general preventive health recommendations were provided to patient.  See attached scanned questionnaire for additional information.   Signed,   Lindell Noe, MHA, BS, LPN Health Coach

## 2018-02-14 ENCOUNTER — Encounter: Payer: Self-pay | Admitting: Family Medicine

## 2018-02-14 ENCOUNTER — Ambulatory Visit (INDEPENDENT_AMBULATORY_CARE_PROVIDER_SITE_OTHER): Payer: PPO | Admitting: Family Medicine

## 2018-02-14 VITALS — BP 136/74 | HR 74 | Temp 98.4°F | Ht 70.5 in | Wt 204.5 lb

## 2018-02-14 DIAGNOSIS — E785 Hyperlipidemia, unspecified: Secondary | ICD-10-CM | POA: Diagnosis not present

## 2018-02-14 DIAGNOSIS — Z7189 Other specified counseling: Secondary | ICD-10-CM | POA: Diagnosis not present

## 2018-02-14 DIAGNOSIS — Z8249 Family history of ischemic heart disease and other diseases of the circulatory system: Secondary | ICD-10-CM

## 2018-02-14 DIAGNOSIS — Z0000 Encounter for general adult medical examination without abnormal findings: Secondary | ICD-10-CM | POA: Diagnosis not present

## 2018-02-14 DIAGNOSIS — K429 Umbilical hernia without obstruction or gangrene: Secondary | ICD-10-CM | POA: Diagnosis not present

## 2018-02-14 NOTE — Progress Notes (Addendum)
BP 136/74 (BP Location: Left Arm, Patient Position: Sitting, Cuff Size: Normal)   Pulse 74   Temp 98.4 F (36.9 C) (Oral)   Ht 5' 10.5" (1.791 m)   Wt 204 lb 8 oz (92.8 kg)   SpO2 96%   BMI 28.93 kg/m   BP Readings from Last 3 Encounters:  02/14/18 136/74  02/13/18 138/70  02/11/17 (!) 124/58    CC: CPE Subjective:    Patient ID: Martin Garza, male    DOB: [birth year], 67 y.o.   MRN: [id number]  HPI: Martin Garza is a 67 y.o. male presenting on 02/14/2018 for Annual Exam (Pt 2.)   Saw Katha Cabal last week for medicare wellness visit. Note reviewed.   Preventative: Colon cancer screening - no colonoscopy. Cologuard positive 01/2018 -  Prostate cancer screening - would like to continue.  Flu shot - declines Prevnar 92016, pneumovax 01/2016 Tdap 08/2011.  zostavax - declines shingrix - discussed, declines  Advanced directives - would want wife to be HCPOA. Thinks this is set up at home.  Seat belt use discussed Sunscreen use discussed, no suspicious moles Non smoker Alcohol - occasional - 1 drink at a time every few weeks Dentist Q6 mo Eye exam regularly - L eye vision trouble  Caffeine: 3 cups coffee/day, some unsweet tea Lives with wife, 1 dog. one grown daughter  Occupation: Adult nurse  Activity: no regular exercise - walks 3 mi/day at work  Diet: some water, fruits/vegetables daily, avoids fried foods and simple carbs    Relevant past medical, surgical, family and social history reviewed and updated as indicated. Interim medical history since our last visit reviewed. Allergies and medications reviewed and updated. Outpatient Medications Prior to Visit  Medication Sig Dispense Refill  . Cyanocobalamin (B-12 PO) Take 1,000 mcg by mouth daily.     No facility-administered medications prior to visit.      Per HPI unless specifically indicated in ROS section below Review of Systems  Constitutional: Negative for activity change, appetite change,  chills, fatigue, fever and unexpected weight change.  HENT: Negative for hearing loss.   Eyes: Negative for visual disturbance.  Respiratory: Negative for cough, chest tightness, shortness of breath and wheezing.   Cardiovascular: Negative for chest pain, palpitations and leg swelling.  Gastrointestinal: Negative for abdominal distention, abdominal pain, blood in stool, constipation, diarrhea, nausea and vomiting.  Genitourinary: Negative for difficulty urinating and hematuria.  Musculoskeletal: Negative for arthralgias, myalgias and neck pain.  Skin: Negative for rash.  Neurological: Negative for dizziness, seizures, syncope and headaches.  Hematological: Negative for adenopathy. Does not bruise/bleed easily.  Psychiatric/Behavioral: Negative for dysphoric mood. The patient is not nervous/anxious.        Objective:    BP 136/74 (BP Location: Left Arm, Patient Position: Sitting, Cuff Size: Normal)   Pulse 74   Temp 98.4 F (36.9 C) (Oral)   Ht 5' 10.5" (1.791 m)   Wt 204 lb 8 oz (92.8 kg)   SpO2 96%   BMI 28.93 kg/m   Wt Readings from Last 3 Encounters:  02/14/18 204 lb 8 oz (92.8 kg)  02/13/18 199 lb 8 oz (90.5 kg)  02/11/17 194 lb 8 oz (88.2 kg)    Physical Exam  Constitutional: He is oriented to person, place, and time. He appears well-developed and well-nourished. No distress.  HENT:  Head: Normocephalic and atraumatic.  Right Ear: Hearing, tympanic membrane, external ear and ear canal normal.  Left Ear: Hearing, tympanic membrane, external ear and  ear canal normal.  Nose: Nose normal.  Mouth/Throat: Uvula is midline, oropharynx is clear and moist and mucous membranes are normal. No oropharyngeal exudate, posterior oropharyngeal edema or posterior oropharyngeal erythema.  Eyes: Pupils are equal, round, and reactive to light. Conjunctivae and EOM are normal. No scleral icterus.  Neck: Normal range of motion. Neck supple. Carotid bruit is not present. No thyromegaly present.   Cardiovascular: Normal rate, regular rhythm, normal heart sounds and intact distal pulses.  No murmur heard. Pulses:      Radial pulses are 2+ on the right side, and 2+ on the left side.  Pulmonary/Chest: Effort normal and breath sounds normal. No respiratory distress. He has no wheezes. He has no rales.  Abdominal: Soft. Bowel sounds are normal. He exhibits no distension and no mass. There is no tenderness. There is no rebound and no guarding.  Genitourinary: Rectum normal and prostate normal. Rectal exam shows no external hemorrhoid, no internal hemorrhoid, no fissure, no mass, no tenderness and anal tone normal. Prostate is not enlarged (20gm) and not tender.  Musculoskeletal: Normal range of motion. He exhibits no edema.  Lymphadenopathy:    He has no cervical adenopathy.  Neurological: He is alert and oriented to person, place, and time.  CN grossly intact, station and gait intact  Skin: Skin is warm and dry. No rash noted.  Psychiatric: He has a normal mood and affect. His behavior is normal. Judgment and thought content normal.  Nursing note and vitals reviewed.  Results for orders placed or performed in visit on 02/13/18  PSA  Result Value Ref Range   PSA 1.33 0.10 - 4.00 ng/mL  Comprehensive metabolic panel  Result Value Ref Range   Sodium 141 135 - 145 mEq/L   Potassium 4.5 3.5 - 5.1 mEq/L   Chloride 105 96 - 112 mEq/L   CO2 29 19 - 32 mEq/L   Glucose, Bld 95 70 - 99 mg/dL   BUN 15 6 - 23 mg/dL   Creatinine, Ser 1.03 0.40 - 1.50 mg/dL   Total Bilirubin 0.5 0.2 - 1.2 mg/dL   Alkaline Phosphatase 53 39 - 117 U/L   AST 14 0 - 37 U/L   ALT 15 0 - 53 U/L   Total Protein 6.7 6.0 - 8.3 g/dL   Albumin 4.3 3.5 - 5.2 g/dL   Calcium 9.4 8.4 - 10.5 mg/dL   GFR 76.08 >60.00 mL/min  Lipid panel  Result Value Ref Range   Cholesterol 219 (H) 0 - 200 mg/dL   Triglycerides 285.0 (H) 0.0 - 149.0 mg/dL   HDL 40.80 >39.00 mg/dL   VLDL 57.0 (H) 0.0 - 40.0 mg/dL   Total CHOL/HDL Ratio  5    NonHDL 178.59   LDL cholesterol, direct  Result Value Ref Range   Direct LDL 160.0 mg/dL   EKG - NSR rate 60s, normal axis, intervals, no acute ST/T changes    Assessment & Plan:   Problem List Items Addressed This Visit    Umbilical hernia    Chronic, overall stable.  Discussed with patient - he requests gen surgery eval.       Relevant Orders   Ambulatory referral to General Surgery   HLD (hyperlipidemia)    Chronic, has not tolerated statin in the past. Discussed options - pt opts to continue working towards healthy diet changes to help control lipid levels.  The 10-year ASCVD risk score Mikey Bussing DC Jr., et al., 2013) is: 21.4%   Values used to calculate the  score:     Age: 13 years     Sex: Male     Is Non-Hispanic African American: No     Diabetic: No     Tobacco smoker: No     Systolic Blood Pressure: 704 mmHg     Is BP treated: No     HDL Cholesterol: 40.8 mg/dL     Total Cholesterol: 219 mg/dL       Relevant Orders   EKG 12-Lead   Health maintenance examination - Primary    Preventative protocols reviewed and updated unless pt declined. Discussed healthy diet and lifestyle.       Family history of early CAD    Declines statin. Update EKG today .       Relevant Orders   EKG 12-Lead   Advanced care planning/counseling discussion    Advanced directives - would want wife to be HCPOA. Thinks this is set up at home.           No orders of the defined types were placed in this encounter.  Orders Placed This Encounter  Procedures  . Ambulatory referral to General Surgery    Referral Priority:   Routine    Referral Type:   Surgical    Referral Reason:   Specialty Services Required    Requested Specialty:   General Surgery    Number of Visits Requested:   1  . EKG 12-Lead    Follow up plan: Return in about 1 year (around 02/15/2019) for annual exam, prior fasting for blood work, medicare wellness visit.  Ria Bush, MD

## 2018-02-14 NOTE — Patient Instructions (Addendum)
Touch base with our referral coordinators about colonoscopy.  Continue working on healthy diet to lower cholesterol levels.  Return for repeat labwork in 6 months (fasting).  We will refer you to general surgeons at [facility] surgery. Bring me copy of your advanced directive.  Watch blood pressures and let me know if >140/90 Return as needed or in 1 year for next wellness and physical.   Health Maintenance, Male A healthy lifestyle and preventive care is important for your health and wellness. Ask your health care provider about what schedule of regular examinations is right for you. What should I know about weight and diet? Eat a Healthy Diet  Eat plenty of vegetables, fruits, whole grains, low-fat dairy products, and lean protein.  Do not eat a lot of foods high in solid fats, added sugars, or salt.  Maintain a Healthy Weight Regular exercise can help you achieve or maintain a healthy weight. You should:  Do at least 150 minutes of exercise each week. The exercise should increase your heart rate and make you sweat (moderate-intensity exercise).  Do strength-training exercises at least twice a week.  Watch Your Levels of Cholesterol and Blood Lipids  Have your blood tested for lipids and cholesterol every 5 years starting at 69 years of age. If you are at high risk for heart disease, you should start having your blood tested when you are 69 years old. You may need to have your cholesterol levels checked more often if: ? Your lipid or cholesterol levels are high. ? You are older than 69 years of age. ? You are at high risk for heart disease.  What should I know about cancer screening? Many types of cancers can be detected early and may often be prevented. Lung Cancer  You should be screened every year for lung cancer if: ? You are a current smoker who has smoked for at least 30 years. ? You are a former smoker who has quit within the past 15 years.  Talk to your health  care provider about your screening options, when you should start screening, and how often you should be screened.  Colorectal Cancer  Routine colorectal cancer screening usually begins at 69 years of age and should be repeated every 5-10 years until you are 69 years old. You may need to be screened more often if early forms of precancerous polyps or small growths are found. Your health care provider may recommend screening at an earlier age if you have risk factors for colon cancer.  Your health care provider may recommend using home test kits to check for hidden blood in the stool.  A small camera at the end of a tube can be used to examine your colon (sigmoidoscopy or colonoscopy). This checks for the earliest forms of colorectal cancer.  Prostate and Testicular Cancer  Depending on your age and overall health, your health care provider may do certain tests to screen for prostate and testicular cancer.  Talk to your health care provider about any symptoms or concerns you have about testicular or prostate cancer.  Skin Cancer  Check your skin from head to toe regularly.  Tell your health care provider about any new moles or changes in moles, especially if: ? There is a change in a mole's size, shape, or color. ? You have a mole that is larger than a pencil eraser.  Always use sunscreen. Apply sunscreen liberally and repeat throughout the day.  Protect yourself by wearing long sleeves, pants, a  wide-brimmed hat, and sunglasses when outside.  What should I know about heart disease, diabetes, and high blood pressure?  If you are 8-59 years of age, have your blood pressure checked every 3-5 years. If you are 75 years of age or older, have your blood pressure checked every year. You should have your blood pressure measured twice-once when you are at a hospital or clinic, and once when you are not at a hospital or clinic. Record the average of the two measurements. To check your blood  pressure when you are not at a hospital or clinic, you can use: ? An automated blood pressure machine at a pharmacy. ? A home blood pressure monitor.  Talk to your health care provider about your target blood pressure.  If you are between 23-58 years old, ask your health care provider if you should take aspirin to prevent heart disease.  Have regular diabetes screenings by checking your fasting blood sugar level. ? If you are at a normal weight and have a low risk for diabetes, have this test once every three years after the age of 49. ? If you are overweight and have a high risk for diabetes, consider being tested at a younger age or more often.  A one-time screening for abdominal aortic aneurysm (AAA) by ultrasound is recommended for men aged 100-75 years who are current or former smokers. What should I know about preventing infection? Hepatitis B If you have a higher risk for hepatitis B, you should be screened for this virus. Talk with your health care provider to find out if you are at risk for hepatitis B infection. Hepatitis C Blood testing is recommended for:  Everyone born from [birth year] through 1967.  Anyone with known risk factors for hepatitis C.  Sexually Transmitted Diseases (STDs)  You should be screened each year for STDs including gonorrhea and chlamydia if: ? You are sexually active and are younger than 69 years of age. ? You are older than 69 years of age and your health care provider tells you that you are at risk for this type of infection. ? Your sexual activity has changed since you were last screened and you are at an increased risk for chlamydia or gonorrhea. Ask your health care provider if you are at risk.  Talk with your health care provider about whether you are at high risk of being infected with HIV. Your health care provider may recommend a prescription medicine to help prevent HIV infection.  What else can I do?  Schedule regular health, dental, and eye  exams.  Stay current with your vaccines (immunizations).  Do not use any tobacco products, such as cigarettes, chewing tobacco, and e-cigarettes. If you need help quitting, ask your health care provider.  Limit alcohol intake to no more than 2 drinks per day. One drink equals 12 ounces of beer, 5 ounces of wine, or 1 ounces of hard liquor.  Do not use street drugs.  Do not share needles.  Ask your health care provider for help if you need support or information about quitting drugs.  Tell your health care provider if you often feel depressed.  Tell your health care provider if you have ever been abused or do not feel safe at home. This information is not intended to replace advice given to you by your health care provider. Make sure you discuss any questions you have with your health care provider. Document Released: 11/03/2007 Document Revised: 01/04/2016 Document Reviewed: 02/08/2015 Elsevier Interactive Patient  Education  2018 [employer]  

## 2018-02-14 NOTE — Assessment & Plan Note (Signed)
Chronic, overall stable.  Discussed with patient - he requests gen surgery eval.

## 2018-02-14 NOTE — Assessment & Plan Note (Signed)
Declines statin. Update EKG today .

## 2018-02-14 NOTE — Assessment & Plan Note (Signed)
Chronic, has not tolerated statin in the past. Discussed options - pt opts to continue working towards healthy diet changes to help control lipid levels.  The 10-year ASCVD risk score Mikey Bussing DC Brooke Bonito., et al., 2013) is: 21.4%   Values used to calculate the score:     Age: 67 years     Sex: Male     Is Non-Hispanic African American: No     Diabetic: No     Tobacco smoker: No     Systolic Blood Pressure: 631 mmHg     Is BP treated: No     HDL Cholesterol: 40.8 mg/dL     Total Cholesterol: 219 mg/dL

## 2018-02-14 NOTE — Assessment & Plan Note (Signed)
Preventative protocols reviewed and updated unless pt declined. Discussed healthy diet and lifestyle.  

## 2018-02-14 NOTE — Assessment & Plan Note (Signed)
Advanced directives - would want wife to be HCPOA. Thinks this is set up at home.

## 2018-02-19 DIAGNOSIS — K429 Umbilical hernia without obstruction or gangrene: Secondary | ICD-10-CM | POA: Diagnosis not present

## 2018-02-25 ENCOUNTER — Encounter: Payer: Self-pay | Admitting: Gastroenterology

## 2018-03-17 DIAGNOSIS — H35352 Cystoid macular degeneration, left eye: Secondary | ICD-10-CM | POA: Diagnosis not present

## 2018-03-17 DIAGNOSIS — Z961 Presence of intraocular lens: Secondary | ICD-10-CM | POA: Diagnosis not present

## 2018-03-28 ENCOUNTER — Ambulatory Visit (AMBULATORY_SURGERY_CENTER): Payer: Self-pay

## 2018-03-28 ENCOUNTER — Encounter: Payer: Self-pay | Admitting: Gastroenterology

## 2018-03-28 VITALS — Ht 71.0 in | Wt 207.8 lb

## 2018-03-28 DIAGNOSIS — Z1211 Encounter for screening for malignant neoplasm of colon: Secondary | ICD-10-CM

## 2018-03-28 MED ORDER — PEG 3350-KCL-NA BICARB-NACL 420 G PO SOLR: 4000.0000 mL | Freq: Once | ORAL | 0 refills | Status: AC

## 2018-03-28 NOTE — Progress Notes (Signed)
Per pt, no allergies to soy or egg products.Pt not taking any weight loss meds or using  O2 at home.  Pt refused emmi video. 

## 2018-03-31 ENCOUNTER — Telehealth: Payer: Self-pay | Admitting: Gastroenterology

## 2018-03-31 NOTE — Telephone Encounter (Signed)
Martin Garza is scheduled for a colon with you 11-22 Friday - he has no previous GI  history-  He has had a PV-- Wife states that her husband is very upset about the Golytely prep and having to split dose the prep.  She states they have friends that have had colons recently and they all have had Miralax and all drank the night before the procedure.  I explained the reason behind the split prep.. She states Martin Garza is worried that he will not make the 45 minute trip here without having an accident.    She states he is at the point he wants to cancel and she is trying to prevent that.  Can he do Miralax and All Thursday night?    Please advise.  Thanks so much for your time, Lelan Pons

## 2018-03-31 NOTE — Telephone Encounter (Signed)
Pt's wife Coralyn Mark called requesting if it is possible for pt to take full dose of prep instead of splitting it. She stated that her husband is very concerned about not being able to sleep and having an accident on the way to the office the day of proc due to second dose in the morning. She is also inquiring about using miralax prep instead of golytely. Pls call her.

## 2018-04-01 NOTE — Telephone Encounter (Signed)
I called and explained Dr.Danis recommendations to the patient's wife and the patient. Mr.Borgeson states he feels better about doing the Miralax prep. New instructions mailed to the patient today, pt aware and he will call us with any questions.

## 2018-04-01 NOTE — Telephone Encounter (Signed)
In my experience, the miralax prep does not give as good quality a prep as golytely.  Better prep = more thorough examination for patient. However, if he does not want to do golytely, here is my offer:  Ducolax/gatorade prep, but must be split.  Split preps (no matter the solution used) are standard of care because they give better prep than all night before. If he wants to start it 1/2 hour earlier in AM because of long drive here, fine with me.  Hope he is agreeable, and look forward to meeting him then.

## 2018-04-11 ENCOUNTER — Ambulatory Visit (AMBULATORY_SURGERY_CENTER): Payer: PPO | Admitting: Gastroenterology

## 2018-04-11 ENCOUNTER — Encounter: Payer: Self-pay | Admitting: Gastroenterology

## 2018-04-11 VITALS — BP 136/76 | HR 63 | Temp 97.3°F | Resp 16 | Ht 71.0 in | Wt 207.0 lb

## 2018-04-11 DIAGNOSIS — D120 Benign neoplasm of cecum: Secondary | ICD-10-CM | POA: Diagnosis not present

## 2018-04-11 DIAGNOSIS — D122 Benign neoplasm of ascending colon: Secondary | ICD-10-CM | POA: Diagnosis not present

## 2018-04-11 DIAGNOSIS — R195 Other fecal abnormalities: Secondary | ICD-10-CM

## 2018-04-11 DIAGNOSIS — Z1211 Encounter for screening for malignant neoplasm of colon: Secondary | ICD-10-CM | POA: Diagnosis not present

## 2018-04-11 DIAGNOSIS — K635 Polyp of colon: Secondary | ICD-10-CM

## 2018-04-11 DIAGNOSIS — D123 Benign neoplasm of transverse colon: Secondary | ICD-10-CM

## 2018-04-11 MED ORDER — SODIUM CHLORIDE 0.9 % IV SOLN: 500.0000 mL | Freq: Once | INTRAVENOUS | Status: DC

## 2018-04-11 NOTE — Op Note (Signed)
[facility] Patient Name: Martin Garza Procedure Date: 04/11/2018 11:13 AM MRN: [id number] Endoscopist: Mallie Mussel L. Loletha Carrow , MD Age: 67 Referring MD:  Date of Birth: January 17, 1951 Gender: Male Account #: [id number] Procedure:                Colonoscopy Indications:              Positive Cologuard test (no prior colonoscopy) Medicines:                Monitored Anesthesia Care Procedure:                Pre-Anesthesia Assessment:                           - Prior to the procedure, a History and Physical                            was performed, and patient medications and                            allergies were reviewed. The patient's tolerance of                            previous anesthesia was also reviewed. The risks                            and benefits of the procedure and the sedation                            options and risks were discussed with the patient.                            All questions were answered, and informed consent                            was obtained. Prior Anticoagulants: The patient has                            taken no previous anticoagulant or antiplatelet                            agents. ASA Grade Assessment: II - A patient with                            mild systemic disease. After reviewing the risks                            and benefits, the patient was deemed in                            satisfactory condition to undergo the procedure.                           After obtaining informed consent, the colonoscope  was passed under direct vision. Throughout the                            procedure, the patient's blood pressure, pulse, and                            oxygen saturations were monitored continuously. The                            Colonoscope was introduced through the anus and                            advanced to the the cecum, identified by                            appendiceal orifice and  ileocecal valve. The                            colonoscopy was performed without difficulty. The                            patient tolerated the procedure well. The quality                            of the bowel preparation was good. The ileocecal                            valve, appendiceal orifice, and rectum were                            photographed. Scope In: 11:19:43 AM Scope Out: 11:44:56 AM Scope Withdrawal Time: 0 hours 21 minutes 58 seconds  Total Procedure Duration: 0 hours 25 minutes 13 seconds  Findings:                 The perianal and digital rectal examinations were                            normal.                           A 2 mm polyp was found in the cecum. The polyp was                            sessile. The polyp was removed with a cold biopsy                            forceps. Resection and retrieval were complete.                           A 10 mm polyp was found in the proximal ascending                            colon. The polyp was flat. The polyp was removed  with a cold snare. Resection and retrieval were                            complete.                           A 6 mm polyp was found in the distal ascending                            colon. The polyp was sessile. The polyp was removed                            with a cold snare. Resection and retrieval were                            complete.                           A 10 mm polyp was found in the distal transverse                            colon. The polyp was sessile. The polyp was removed                            with a cold snare. Resection and retrieval were                            complete.                           Many diverticula were found in the left colon.                           The exam was otherwise without abnormality on                            direct and retroflexion views. Complications:            No immediate  complications. Estimated Blood Loss:     Estimated blood loss was minimal. Impression:               - One 2 mm polyp in the cecum, removed with a cold                            biopsy forceps. Resected and retrieved.                           - One 10 mm polyp in the proximal ascending colon,                            removed with a cold snare. Resected and retrieved.                           - One 6 mm polyp in the distal ascending colon,  removed with a cold snare. Resected and retrieved.                           - One 10 mm polyp in the distal transverse colon,                            removed with a cold snare. Resected and retrieved.                           - Diverticulosis in the left colon.                           - The examination was otherwise normal on direct                            and retroflexion views. Recommendation:           - Patient has a contact number available for                            emergencies. The signs and symptoms of potential                            delayed complications were discussed with the                            patient. Return to normal activities tomorrow.                            Written discharge instructions were provided to the                            patient.                           - Resume previous diet.                           - Continue present medications.                           - Await pathology results.                           - Repeat colonoscopy is recommended for                            surveillance. The colonoscopy date will be                            determined after pathology results from today's                            exam become available for review.  L. Loletha Carrow, MD 04/11/2018 11:54:50 AM This report has been signed electronically.

## 2018-04-11 NOTE — Patient Instructions (Signed)
Impression/Recommendations:  Polyp handout given to patient. Diverticulosis handout given to patient.  Resume previous diet. Continue present medications.  Repeat colonoscopy recommended for surveillance.  Date to be determined after pathology results reviewed.  YOU HAD AN ENDOSCOPIC PROCEDURE TODAY AT THE [facility]:   Refer to the procedure report that was given to you for any specific questions about what was found during the examination.  If the procedure report does not answer your questions, please call your gastroenterologist to clarify.  If you requested that your care partner not be given the details of your procedure findings, then the procedure report has been included in a sealed envelope for you to review at your convenience later.  YOU SHOULD EXPECT: Some feelings of bloating in the abdomen. Passage of more gas than usual.  Walking can help get rid of the air that was put into your GI tract during the procedure and reduce the bloating. If you had a lower endoscopy (such as a colonoscopy or flexible sigmoidoscopy) you may notice spotting of blood in your stool or on the toilet paper. If you underwent a bowel prep for your procedure, you may not have a normal bowel movement for a few days.  Please Note:  You might notice some irritation and congestion in your nose or some drainage.  This is from the oxygen used during your procedure.  There is no need for concern and it should clear up in a day or so.  SYMPTOMS TO REPORT IMMEDIATELY:   Following lower endoscopy (colonoscopy or flexible sigmoidoscopy):  Excessive amounts of blood in the stool  Significant tenderness or worsening of abdominal pains  Swelling of the abdomen that is new, acute  Fever of 100F or higher  For urgent or emergent issues, a gastroenterologist can be reached at any hour by calling [phone].   DIET:  We do recommend a small meal at first, but then you may proceed to your regular diet.   Drink plenty of fluids but you should avoid alcoholic beverages for 24 hours.  ACTIVITY:  You should plan to take it easy for the rest of today and you should NOT DRIVE or use heavy machinery until tomorrow (because of the sedation medicines used during the test).    FOLLOW UP: Our staff will call the number listed on your records the next business day following your procedure to check on you and address any questions or concerns that you may have regarding the information given to you following your procedure. If we do not reach you, we will leave a message.  However, if you are feeling well and you are not experiencing any problems, there is no need to return our call.  We will assume that you have returned to your regular daily activities without incident.  If any biopsies were taken you will be contacted by phone or by letter within the next 1-3 weeks.  Please call us at [phone] if you have not heard about the biopsies in 3 weeks.    SIGNATURES/CONFIDENTIALITY: You and/or your care partner have signed paperwork which will be entered into your electronic medical record.  These signatures attest to the fact that that the information above on your After Visit Summary has been reviewed and is understood.  Full responsibility of the confidentiality of this discharge information lies with you and/or your care-partner. 

## 2018-04-11 NOTE — Progress Notes (Signed)
Called to room to assist during endoscopic procedure.  Patient ID and intended procedure confirmed with present staff. Received instructions for my participation in the procedure from the performing physician.  

## 2018-04-11 NOTE — Progress Notes (Signed)
PT taken to PACU. Monitors in place. VSS. Report given to RN. 

## 2018-04-14 ENCOUNTER — Telehealth: Payer: Self-pay | Admitting: *Deleted

## 2018-04-14 ENCOUNTER — Telehealth: Payer: Self-pay

## 2018-04-14 NOTE — Telephone Encounter (Signed)
  Follow up Call-  Call back number 04/11/2018  Post procedure Call Back phone  # [phone]  Permission to leave phone message Yes  Some recent data might be hidden     Patient questions:  Do you have a fever, pain , or abdominal swelling? No. Pain Score  0 *  Have you tolerated food without any problems? Yes.    Have you been able to return to your normal activities? Yes.    Do you have any questions about your discharge instructions: Diet   No. Medications  No. Follow up visit  No.  Do you have questions or concerns about your Care? No.  Actions: * If pain score is 4 or above: No action needed, pain <4.

## 2018-04-14 NOTE — Telephone Encounter (Signed)
No answer. Number identifier. Message left to call if questions or concerns and that we would make an attempt to call later in the day.

## 2018-04-20 HISTORY — PX: COLONOSCOPY: SHX174

## 2018-04-21 ENCOUNTER — Encounter: Payer: Self-pay | Admitting: Gastroenterology

## 2018-04-24 ENCOUNTER — Encounter: Payer: Self-pay | Admitting: Family Medicine

## 2018-04-30 NOTE — Progress Notes (Signed)
Please place orders in Epic as patient is being scheduled for a pre-op appointment! Thank you! 

## 2018-05-26 NOTE — Progress Notes (Signed)
EKG 02-14-18  Epic

## 2018-05-26 NOTE — Patient Instructions (Addendum)
Martin Garza  05/26/2018   Your procedure is scheduled on: 05-30-18    Report to [facility] Main  Entrance    Report to admitting at 8:30AM    Call this number if you have problems the morning of surgery [phone]      Remember: NO SOLID FOOD AFTER MIDNIGHT THE NIGHT PRIOR TO SURGERY. NOTHING BY MOUTH EXCEPT CLEAR LIQUIDS UNTIL 3 HOURS PRIOR TO [place] SURGERY. PLEASE FINISH ENSURE DRINK PER SURGEON ORDER 3 HOURS PRIOR TO SCHEDULED SURGERY TIME WHICH NEEDS TO BE COMPLETED AT ____7:30AM________. BRUSH YOUR TEETH MORNING OF SURGERY AND RINSE YOUR MOUTH OUT, NO CHEWING GUM CANDY OR MINTS.        CLEAR LIQUID DIET   Foods Allowed                                                                     Foods Excluded  Coffee and tea, regular and decaf                             liquids that you cannot  Plain Jell-O in any flavor                                             see through such as: Fruit ices (not with fruit pulp)                                     milk, soups, orange juice  Iced Popsicles                                    All solid food Carbonated beverages, regular and diet                                    Cranberry, grape and apple juices Sports drinks like Gatorade Lightly seasoned clear broth or consume(fat free) Sugar, honey syrup  Sample Menu Breakfast                                Lunch                                     Supper Cranberry juice                    Beef broth                            Chicken broth Jell-O  Grape juice                           Apple juice Coffee or tea                        Jell-O                                      Popsicle                                                Coffee or tea                        Coffee or tea  _____________________________________________________________________     Take these medicines the morning of surgery with A SIP OF  WATER: none                                You may not have any metal on your body including hair pins and              piercings  Do not wear jewelry, make-up, lotions, powders or perfumes, deodorant                         Men may shave face and neck.   Do not bring valuables to the hospital. [place].  Contacts, dentures or bridgework may not be worn into surgery.       Patients discharged the day of surgery will not be allowed to drive home. IF YOU ARE HAVING SURGERY AND GOING HOME THE SAME DAY, YOU MUST HAVE AN ADULT TO DRIVE YOU HOME AND BE WITH YOU FOR 24 HOURS. YOU MAY GO HOME BY TAXI OR UBER OR ORTHERWISE, BUT AN ADULT MUST ACCOMPANY YOU HOME AND STAY WITH YOU FOR 24 HOURS.  Name and phone number of your driver:  Special Instructions: N/A              Please read over the following fact sheets you were given: _____________________________________________________________________             [facility] - Preparing for Surgery Before surgery, you can play an important role.  Because skin is not sterile, your skin needs to be as free of germs as possible.  You can reduce the number of germs on your skin by washing with CHG (chlorahexidine gluconate) soap before surgery.  CHG is an antiseptic cleaner which kills germs and bonds with the skin to continue killing germs even after washing. Please DO NOT use if you have an allergy to CHG or antibacterial soaps.  If your skin becomes reddened/irritated stop using the CHG and inform your nurse when you arrive at Short Stay. Do not shave (including legs and underarms) for at least 48 hours prior to the first CHG shower.  You may shave your face/neck. Please follow these instructions carefully:  1.  Shower with CHG Soap the night before surgery and the  morning of  Surgery.  2.  If you choose to wash your hair, wash your hair first as usual with your  normal  shampoo.  3.  After you  shampoo, rinse your hair and body thoroughly to remove the  shampoo.                           4.  Use CHG as you would any other liquid soap.  You can apply chg directly  to the skin and wash                       Gently with a scrungie or clean washcloth.  5.  Apply the CHG Soap to your body ONLY FROM THE NECK DOWN.   Do not use on face/ open                           Wound or open sores. Avoid contact with eyes, ears mouth and genitals (private parts).                       Wash face,  Genitals (private parts) with your normal soap.             6.  Wash thoroughly, paying special attention to the area where your surgery  will be performed.  7.  Thoroughly rinse your body with warm water from the neck down.  8.  DO NOT shower/wash with your normal soap after using and rinsing off  the CHG Soap.                9.  Pat yourself dry with a clean towel.            10.  Wear clean pajamas.            11.  Place clean sheets on your bed the night of your first shower and do not  sleep with pets. Day of Surgery : Do not apply any lotions/deodorants the morning of surgery.  Please wear clean clothes to the hospital/surgery center.  FAILURE TO FOLLOW THESE INSTRUCTIONS MAY RESULT IN THE CANCELLATION OF YOUR SURGERY PATIENT SIGNATURE_________________________________  NURSE SIGNATURE__________________________________  ________________________________________________________________________

## 2018-05-28 ENCOUNTER — Encounter (HOSPITAL_COMMUNITY)
Admission: RE | Admit: 2018-05-28 | Discharge: 2018-05-28 | Disposition: A | Discharge status: Home / Self Care | Payer: PPO | Source: Ambulatory Visit | Attending: Surgery | Admitting: Surgery

## 2018-05-28 ENCOUNTER — Other Ambulatory Visit: Payer: Self-pay

## 2018-05-28 ENCOUNTER — Encounter (HOSPITAL_COMMUNITY): Payer: Self-pay

## 2018-05-28 DIAGNOSIS — Z01812 Encounter for preprocedural laboratory examination: Secondary | ICD-10-CM | POA: Insufficient documentation

## 2018-05-28 DIAGNOSIS — Z791 Long term (current) use of non-steroidal anti-inflammatories (NSAID): Secondary | ICD-10-CM | POA: Diagnosis not present

## 2018-05-28 DIAGNOSIS — E785 Hyperlipidemia, unspecified: Secondary | ICD-10-CM | POA: Diagnosis not present

## 2018-05-28 DIAGNOSIS — Z87891 Personal history of nicotine dependence: Secondary | ICD-10-CM | POA: Diagnosis not present

## 2018-05-28 DIAGNOSIS — K420 Umbilical hernia with obstruction, without gangrene: Secondary | ICD-10-CM | POA: Diagnosis not present

## 2018-05-28 HISTORY — DX: Umbilical hernia without obstruction or gangrene: K42.9

## 2018-05-28 LAB — CBC
HCT: 43.4 % (ref 39.0–52.0)
Hemoglobin: 14.5 g/dL (ref 13.0–17.0)
MCH: 32.2 pg (ref 26.0–34.0)
MCHC: 33.4 g/dL (ref 30.0–36.0)
MCV: 96.2 fL (ref 80.0–100.0)
Platelets: 258 10*3/uL (ref 150–400)
RBC: 4.51 MIL/uL (ref 4.22–5.81)
RDW: 12.2 % (ref 11.5–15.5)
WBC: 7.4 10*3/uL (ref 4.0–10.5)
nRBC: 0 % (ref 0.0–0.2)

## 2018-05-29 MED ORDER — BUPIVACAINE LIPOSOME 1.3 % IJ SUSP
20.0000 mL | Freq: Once | INTRAMUSCULAR | Status: DC
  Filled 2018-05-29: qty 20

## 2018-05-29 NOTE — H&P (Signed)
Chief Complaint:  Symptomatic umbilical hernia  History of Present Illness:  Martin Garza is an 68 y.o. 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  I have discussed a lap assisted umbilical hernia repair with mesh  Past Medical History:  Diagnosis Date  . Allergy   . Cataract    had surg in both eyes  . Complication of anesthesia    Blood pressure goes up after waking up past sedation/ gets dizzy too per pt!  . Retinal detachment 2014   left  . Umbilical hernia     Past Surgical History:  Procedure Laterality Date  . CATARACT EXTRACTION  2018   Bil  . COLONOSCOPY  04/2018   TA, SSP, diverticulosis, rpt 3 yrs ([facility]); with polypectom y  . Left inguinal repair  in High School  . RETINAL DETACHMENT SURGERY Left    x 2 in 2014    Current Facility-Administered Medications  Medication Dose Route Frequency Provider Last Rate Last Dose  . [START ON 05/30/2018] bupivacaine liposome (EXPAREL) 1.3 % injection 266 mg  20 mL Infiltration Once Johnathan Hausen, MD       Current Outpatient Medications  Medication Sig Dispense Refill  . acetaminophen (TYLENOL) 325 MG tablet Take 650 mg by mouth every 6 (six) hours as needed for moderate pain.     . Cyanocobalamin (B-12 PO) Take 1,000 mcg by mouth daily.    Marland Kitchen ibuprofen (ADVIL,MOTRIN) 200 MG tablet Take 400 mg by mouth every 8 (eight) hours as needed for headache or moderate pain.     . vitamin C (ASCORBIC ACID) 500 MG tablet Take 1,000 mg by mouth daily.     Patient has no known allergies. Family History  Problem Relation Age of Onset  . Cancer Mother 55       metastatic cancer, smoker  . Coronary artery disease Father 26       MI with CABG, smoker  . Stroke Father        after catheterization  . Alcohol abuse Father   . Alcohol abuse Brother   . Coronary artery disease Brother 35       CABG, smoker  . Depression Brother   . Drug abuse Brother   . Hearing loss Daughter   .  Cancer Maternal Aunt        breast  . Breast cancer Maternal Aunt   . Diabetes Neg Hx   . Colon cancer Neg Hx   . Rectal cancer Neg Hx   . Stomach cancer Neg Hx    Social History:   reports that he has never smoked. He quit smokeless tobacco use about 32 years ago.  His smokeless tobacco use included chew. He reports current alcohol use of about 1.0 standard drinks of alcohol per week. He reports that he does not use drugs.   REVIEW OF SYSTEMS : Negative except for see problem list  Physical Exam:   There were no vitals taken for this visit. There is no height or weight on file to calculate BMI.  Gen:  WDWN WM NAD  Neurological: Alert and oriented to person, place, and time. Motor and sensory function is grossly intact  Head: Normocephalic and atraumatic.  Eyes: Conjunctivae are normal. Pupils are equal, round, and reactive to light. No scleral icterus.  Neck: Normal range of motion. Neck supple. No tracheal deviation or thyromegaly present.  Cardiovascular:  SR without murmurs or gallops.  No  carotid bruits Breast:  Not examined Respiratory: Effort normal.  No respiratory distress. No chest wall tenderness. Breath sounds normal.  No wheezes, rales or rhonchi.  Abdomen:  Two fingerbreadth fascial defect with attenuation of the overlying skin of the umbilicus GU:  Not examined Musculoskeletal: Normal range of motion. Extremities are nontender. No cyanosis, edema or clubbing noted Lymphadenopathy: No cervical, preauricular, postauricular or axillary adenopathy is present Skin: Skin is warm and dry. No rash noted. No diaphoresis. No erythema. No pallor. Pscyh: Normal mood and affect. Behavior is normal. Judgment and thought content normal.   LABORATORY RESULTS: Results for orders placed or performed during the hospital encounter of 05/28/18 (from the past 48 hour(s))  CBC     Status: None   Collection Time: 05/28/18  1:36 PM  Result Value Ref Range   WBC 7.4 4.0 - 10.5 K/uL   RBC  4.51 4.22 - 5.81 MIL/uL   Hemoglobin 14.5 13.0 - 17.0 g/dL   HCT 43.4 39.0 - 52.0 %   MCV 96.2 80.0 - 100.0 fL   MCH 32.2 26.0 - 34.0 pg   MCHC 33.4 30.0 - 36.0 g/dL   RDW 12.2 11.5 - 15.5 %   Platelets 258 150 - 400 K/uL   nRBC 0.0 0.0 - 0.2 %    Comment: Performed at [facility], [place] [address], [place], [place] [address]     RADIOLOGY RESULTS: No results found.  Problem List: Patient Active Problem List   Diagnosis Date Noted  . Health maintenance examination 02/11/2017  . Right foot pain 02/09/2016  . Umbilical hernia 94/17/4081  . Advanced care planning/counseling discussion 01/31/2015  . Medicare annual wellness visit, initial 01/29/2014  . Retinal detachment   . Family history of early CAD 02/19/2012  . HLD (hyperlipidemia) 04/27/2009    Assessment & Plan: Umbilical hernia;  For lap assisted repair.     Matt B. Hassell Done, MD, [facility] [phone] beeper [phone]  05/29/2018 8:17 AM

## 2018-05-30 ENCOUNTER — Ambulatory Visit (HOSPITAL_COMMUNITY): Payer: PPO | Admitting: Physician Assistant

## 2018-05-30 ENCOUNTER — Other Ambulatory Visit: Payer: Self-pay

## 2018-05-30 ENCOUNTER — Ambulatory Visit (HOSPITAL_COMMUNITY): Payer: PPO | Admitting: Anesthesiology

## 2018-05-30 ENCOUNTER — Encounter (HOSPITAL_COMMUNITY)
Admission: RE | Disposition: A | Discharge status: Home / Self Care | Payer: Self-pay | Source: Home / Self Care | Attending: Surgery

## 2018-05-30 ENCOUNTER — Encounter (HOSPITAL_COMMUNITY): Payer: Self-pay | Admitting: *Deleted

## 2018-05-30 ENCOUNTER — Ambulatory Visit (HOSPITAL_COMMUNITY)
Admission: RE | Admit: 2018-05-30 | Discharge: 2018-05-30 | Disposition: A | Discharge status: Home / Self Care | Payer: PPO | Attending: Surgery | Admitting: Surgery

## 2018-05-30 DIAGNOSIS — Z791 Long term (current) use of non-steroidal anti-inflammatories (NSAID): Secondary | ICD-10-CM | POA: Diagnosis not present

## 2018-05-30 DIAGNOSIS — K429 Umbilical hernia without obstruction or gangrene: Secondary | ICD-10-CM | POA: Diagnosis not present

## 2018-05-30 DIAGNOSIS — Z87891 Personal history of nicotine dependence: Secondary | ICD-10-CM | POA: Diagnosis not present

## 2018-05-30 DIAGNOSIS — E785 Hyperlipidemia, unspecified: Secondary | ICD-10-CM | POA: Diagnosis not present

## 2018-05-30 DIAGNOSIS — K420 Umbilical hernia with obstruction, without gangrene: Secondary | ICD-10-CM | POA: Insufficient documentation

## 2018-05-30 HISTORY — PX: UMBILICAL HERNIA REPAIR: SHX196

## 2018-05-30 SURGERY — REPAIR, HERNIA, UMBILICAL, LAPAROSCOPIC
Anesthesia: General | Site: Abdomen

## 2018-05-30 MED ORDER — ACETAMINOPHEN 500 MG PO TABS
1000.0000 mg | ORAL_TABLET | ORAL | Status: AC
  Administered 2018-05-30: 1000 mg via ORAL
  Filled 2018-05-30: qty 2

## 2018-05-30 MED ORDER — EPHEDRINE 5 MG/ML INJ
INTRAVENOUS | Status: AC
  Filled 2018-05-30: qty 10

## 2018-05-30 MED ORDER — DEXAMETHASONE SODIUM PHOSPHATE 10 MG/ML IJ SOLN
INTRAMUSCULAR | Status: DC | PRN
  Administered 2018-05-30: 8 mg via INTRAVENOUS

## 2018-05-30 MED ORDER — EPINEPHRINE PF 1 MG/10ML IJ SOSY
PREFILLED_SYRINGE | INTRAMUSCULAR | Status: AC
  Filled 2018-05-30: qty 10

## 2018-05-30 MED ORDER — ROCURONIUM BROMIDE 10 MG/ML (PF) SYRINGE
PREFILLED_SYRINGE | INTRAVENOUS | Status: AC
  Filled 2018-05-30: qty 10

## 2018-05-30 MED ORDER — LIDOCAINE 2% (20 MG/ML) 5 ML SYRINGE
INTRAMUSCULAR | Status: DC | PRN
  Administered 2018-05-30: .5 mg/kg/h via INTRAVENOUS

## 2018-05-30 MED ORDER — BUPIVACAINE LIPOSOME 1.3 % IJ SUSP
INTRAMUSCULAR | Status: DC | PRN
  Administered 2018-05-30: 20 mL

## 2018-05-30 MED ORDER — FENTANYL CITRATE (PF) 250 MCG/5ML IJ SOLN
INTRAMUSCULAR | Status: AC
  Filled 2018-05-30: qty 5

## 2018-05-30 MED ORDER — CHLORHEXIDINE GLUCONATE CLOTH 2 % EX PADS: 6.0000 | MEDICATED_PAD | Freq: Once | CUTANEOUS | Status: DC

## 2018-05-30 MED ORDER — LIDOCAINE HCL (CARDIAC) PF 100 MG/5ML IV SOSY
PREFILLED_SYRINGE | INTRAVENOUS | Status: DC | PRN
  Administered 2018-05-30: 40 mg via INTRAVENOUS

## 2018-05-30 MED ORDER — MIDAZOLAM HCL 5 MG/5ML IJ SOLN
INTRAMUSCULAR | Status: DC | PRN
  Administered 2018-05-30: 2 mg via INTRAVENOUS

## 2018-05-30 MED ORDER — 0.9 % SODIUM CHLORIDE (POUR BTL) OPTIME
TOPICAL | Status: DC | PRN
  Administered 2018-05-30: 1000 mL

## 2018-05-30 MED ORDER — LACTATED RINGERS IV SOLN
INTRAVENOUS | Status: DC
  Administered 2018-05-30 (×2): via INTRAVENOUS

## 2018-05-30 MED ORDER — PROPOFOL 10 MG/ML IV BOLUS
INTRAVENOUS | Status: DC | PRN
  Administered 2018-05-30: 20 mg via INTRAVENOUS
  Administered 2018-05-30: 180 mg via INTRAVENOUS

## 2018-05-30 MED ORDER — FENTANYL CITRATE (PF) 100 MCG/2ML IJ SOLN
INTRAMUSCULAR | Status: DC | PRN
  Administered 2018-05-30 (×5): 50 ug via INTRAVENOUS

## 2018-05-30 MED ORDER — FENTANYL CITRATE (PF) 100 MCG/2ML IJ SOLN: 25.0000 ug | INTRAMUSCULAR | Status: DC | PRN

## 2018-05-30 MED ORDER — ONDANSETRON HCL 4 MG/2ML IJ SOLN
INTRAMUSCULAR | Status: AC
  Filled 2018-05-30: qty 2

## 2018-05-30 MED ORDER — MIDAZOLAM HCL 2 MG/2ML IJ SOLN
INTRAMUSCULAR | Status: AC
  Filled 2018-05-30: qty 2

## 2018-05-30 MED ORDER — HEPARIN SODIUM (PORCINE) 5000 UNIT/ML IJ SOLN
5000.0000 [IU] | Freq: Once | INTRAMUSCULAR | Status: AC
  Administered 2018-05-30: 5000 [IU] via SUBCUTANEOUS
  Filled 2018-05-30: qty 1

## 2018-05-30 MED ORDER — PROPOFOL 10 MG/ML IV BOLUS
INTRAVENOUS | Status: AC
  Filled 2018-05-30: qty 20

## 2018-05-30 MED ORDER — PHENYLEPHRINE 40 MCG/ML (10ML) SYRINGE FOR IV PUSH (FOR BLOOD PRESSURE SUPPORT)
PREFILLED_SYRINGE | INTRAVENOUS | Status: AC
  Filled 2018-05-30: qty 10

## 2018-05-30 MED ORDER — EPHEDRINE SULFATE-NACL 50-0.9 MG/10ML-% IV SOSY
PREFILLED_SYRINGE | INTRAVENOUS | Status: DC | PRN
  Administered 2018-05-30: 10 mg via INTRAVENOUS

## 2018-05-30 MED ORDER — PHENYLEPHRINE 40 MCG/ML (10ML) SYRINGE FOR IV PUSH (FOR BLOOD PRESSURE SUPPORT)
PREFILLED_SYRINGE | INTRAVENOUS | Status: DC | PRN
  Administered 2018-05-30 (×2): 80 ug via INTRAVENOUS

## 2018-05-30 MED ORDER — HYDROCODONE-ACETAMINOPHEN 5-325 MG PO TABS: 1.0000 | ORAL_TABLET | Freq: Four times a day (QID) | ORAL | 0 refills | Status: DC | PRN

## 2018-05-30 MED ORDER — SUGAMMADEX SODIUM 200 MG/2ML IV SOLN
INTRAVENOUS | Status: DC | PRN
  Administered 2018-05-30: 200 mg via INTRAVENOUS

## 2018-05-30 MED ORDER — SUGAMMADEX SODIUM 200 MG/2ML IV SOLN
INTRAVENOUS | Status: AC
  Filled 2018-05-30: qty 2

## 2018-05-30 MED ORDER — CEFAZOLIN SODIUM-DEXTROSE 2-4 GM/100ML-% IV SOLN
2.0000 g | INTRAVENOUS | Status: AC
  Administered 2018-05-30: 2 g via INTRAVENOUS
  Filled 2018-05-30: qty 100

## 2018-05-30 MED ORDER — ONDANSETRON HCL 4 MG/2ML IJ SOLN
INTRAMUSCULAR | Status: DC | PRN
  Administered 2018-05-30: 4 mg via INTRAVENOUS

## 2018-05-30 MED ORDER — ROCURONIUM BROMIDE 100 MG/10ML IV SOLN
INTRAVENOUS | Status: DC | PRN
  Administered 2018-05-30: 50 mg via INTRAVENOUS
  Administered 2018-05-30 (×2): 10 mg via INTRAVENOUS

## 2018-05-30 SURGICAL SUPPLY — 51 items
BINDER ABDOMINAL 12 ML 46-62 (SOFTGOODS) ×2 IMPLANT
CABLE HI FREQUENCY MONOPOLAR (ELECTROSURGICAL) ×3 IMPLANT
CLOSURE WOUND 1/2 X4 (GAUZE/BANDAGES/DRESSINGS)
COTTON BALL STERILE (GAUZE/BANDAGES/DRESSINGS) ×3
COTTON BALL STERILE 2 PK (GAUZE/BANDAGES/DRESSINGS) IMPLANT
COVER SURGICAL LIGHT HANDLE (MISCELLANEOUS) ×3 IMPLANT
COVER WAND RF STERILE (DRAPES) ×2 IMPLANT
DERMABOND ADVANCED (GAUZE/BANDAGES/DRESSINGS) ×2
DERMABOND ADVANCED .7 DNX12 (GAUZE/BANDAGES/DRESSINGS) IMPLANT
DEVICE SECURE STRAP 25 ABSORB (INSTRUMENTS) ×2 IMPLANT
DEVICE TROCAR PUNCTURE CLOSURE (ENDOMECHANICALS) IMPLANT
DISSECTOR BLUNT TIP ENDO 5MM (MISCELLANEOUS) IMPLANT
DRAIN CHANNEL 19F RND (DRAIN) IMPLANT
ELECT PENCIL ROCKER SW 15FT (MISCELLANEOUS) ×3 IMPLANT
ELECT REM PT RETURN 15FT ADLT (MISCELLANEOUS) ×3 IMPLANT
EVACUATOR SILICONE 100CC (DRAIN) IMPLANT
GLOVE BIOGEL M 8.0 STRL (GLOVE) ×3 IMPLANT
GLOVE BIOGEL PI IND STRL 6.5 (GLOVE) IMPLANT
GLOVE BIOGEL PI IND STRL 7.0 (GLOVE) IMPLANT
GLOVE BIOGEL PI IND STRL 7.5 (GLOVE) IMPLANT
GLOVE BIOGEL PI INDICATOR 6.5 (GLOVE) ×2
GLOVE BIOGEL PI INDICATOR 7.0 (GLOVE) ×4
GLOVE BIOGEL PI INDICATOR 7.5 (GLOVE) ×4
GLOVE ECLIPSE 6.5 STRL STRAW (GLOVE) ×2 IMPLANT
GLOVE SURG SS PI 7.0 STRL IVOR (GLOVE) ×2 IMPLANT
GOWN STRL REUS W/TWL XL LVL3 (GOWN DISPOSABLE) ×11 IMPLANT
IRRIG SUCT STRYKERFLOW 2 WTIP (MISCELLANEOUS)
IRRIGATION SUCT STRKRFLW 2 WTP (MISCELLANEOUS) IMPLANT
KIT BASIN OR (CUSTOM PROCEDURE TRAY) ×3 IMPLANT
MARKER SKIN DUAL TIP RULER LAB (MISCELLANEOUS) ×2 IMPLANT
MESH VENTRALEX ST 2.5 CRC MED (Mesh General) ×2 IMPLANT
NDL SPNL 22GX3.5 QUINCKE BK (NEEDLE) IMPLANT
NEEDLE SPNL 22GX3.5 QUINCKE BK (NEEDLE) IMPLANT
PAD POSITIONING PINK XL (MISCELLANEOUS) ×2 IMPLANT
PROTECTOR NERVE ULNAR (MISCELLANEOUS) ×2 IMPLANT
SCISSORS LAP 5X45 EPIX DISP (ENDOMECHANICALS) ×3 IMPLANT
SET TUBE SMOKE EVAC HIGH FLOW (TUBING) ×2 IMPLANT
SLEEVE XCEL OPT CAN 5 100 (ENDOMECHANICALS) ×6 IMPLANT
SOLUTION ANTI FOG 6CC (MISCELLANEOUS) ×3 IMPLANT
STAPLER VISISTAT 35W (STAPLE) IMPLANT
STRIP CLOSURE SKIN 1/2X4 (GAUZE/BANDAGES/DRESSINGS) IMPLANT
SUT NOVA 0 T19/GS 22DT (SUTURE) IMPLANT
SUT NOVA NAB DX-16 0-1 5-0 T12 (SUTURE) ×4 IMPLANT
SUT VIC AB 4-0 SH 18 (SUTURE) ×3 IMPLANT
TACKER 5MM HERNIA 3.5CML NAB (ENDOMECHANICALS) ×1 IMPLANT
TOWEL OR 17X26 10 PK STRL BLUE (TOWEL DISPOSABLE) ×3 IMPLANT
TOWEL OR NON WOVEN STRL DISP B (DISPOSABLE) ×3 IMPLANT
TRAY FOLEY MTR SLVR 16FR STAT (SET/KITS/TRAYS/PACK) ×1 IMPLANT
TRAY LAPAROSCOPIC (CUSTOM PROCEDURE TRAY) ×3 IMPLANT
TROCAR BLADELESS OPT 5 100 (ENDOMECHANICALS) ×3 IMPLANT
TROCAR XCEL NON-BLD 11X100MML (ENDOMECHANICALS) ×3 IMPLANT

## 2018-05-30 NOTE — Discharge Instructions (Signed)
?   Laparoscopic Umbilical General Anesthesia, Adult, Care After This sheet gives you information about how to care for yourself after your procedure. Your health care provider may also give you more specific instructions. If you have problems or questions, contact your health care provider. What can I expect after the procedure? After the procedure, the following side effects are common: Pain or discomfort at the IV site. Nausea. Vomiting. Sore throat. Trouble concentrating. Feeling cold or chills. Weak or tired. Sleepiness and fatigue. Soreness and body aches. These side effects can affect parts of the body that were not involved in surgery. Follow these instructions at home:  For at least 24 hours after the procedure: Have a responsible adult stay with you. It is important to have someone help care for you until you are awake and alert. Rest as needed. Do not: Participate in activities in which you could fall or become injured. Drive. Use heavy machinery. Drink alcohol. Take sleeping pills or medicines that cause drowsiness. Make important decisions or sign legal documents. Take care of children on your own. Eating and drinking Follow any instructions from your health care provider about eating or drinking restrictions. When you feel hungry, start by eating small amounts of foods that are soft and easy to digest (bland), such as toast. Gradually return to your regular diet. Drink enough fluid to keep your urine pale yellow. If you vomit, rehydrate by drinking water, juice, or clear broth. General instructions If you have sleep apnea, surgery and certain medicines can increase your risk for breathing problems. Follow instructions from your health care provider about wearing your sleep device: Anytime you are sleeping, including during daytime naps. While taking prescription pain medicines, sleeping medicines, or medicines that make you drowsy. Return to your normal activities  as told by your health care provider. Ask your health care provider what activities are safe for you. Take over-the-counter and prescription medicines only as told by your health care provider. If you smoke, do not smoke without supervision. Keep all follow-up visits as told by your health care provider. This is important. Contact a health care provider if: You have nausea or vomiting that does not get better with medicine. You cannot eat or drink without vomiting. You have pain that does not get better with medicine. You are unable to pass urine. You develop a skin rash. You have a fever. You have redness around your IV site that gets worse. Get help right away if: You have difficulty breathing. You have chest pain. You have blood in your urine or stool, or you vomit blood. Summary After the procedure, it is common to have a sore throat or nausea. It is also common to feel tired. Have a responsible adult stay with you for the first 24 hours after general anesthesia. It is important to have someone help care for you until you are awake and alert. When you feel hungry, start by eating small amounts of foods that are soft and easy to digest (bland), such as toast. Gradually return to your regular diet. Drink enough fluid to keep your urine pale yellow. Return to your normal activities as told by your health care provider. Ask your health care provider what activities are safe for you. This information is not intended to replace advice given to you by your health care provider. Make sure you discuss any questions you have with your health care provider. Document Released: 08/13/2000 Document Revised: 12/21/2016 Document Reviewed: 12/21/2016 Elsevier Interactive Patient Education  2019  [place] After This sheet gives you information about how to care for yourself after your procedure. Your health care provider may also give you more specific instructions. If you have  problems or questions, contact your health care provider. What can I expect after the procedure? After the procedure, it is common to have:  Pain, discomfort, or soreness. Follow these instructions at home: Incision care   Follow instructions from your health care provider about how to take care of your incision. Make sure you: ? Wash your hands with soap and water before you change your bandage (dressing) or before you touch your abdomen. If soap and water are not available, use hand sanitizer. ? Change your dressing as told by your health care provider. ? Leave stitches (sutures), skin glue, or adhesive strips in place. These skin closures may need to stay in place for 2 weeks or longer. If adhesive strip edges start to loosen and curl up, you may trim the loose edges. Do not remove adhesive strips completely unless your health care provider tells you to do that.  Check your incision area every day for signs of infection. Check for: ? Redness, swelling, or pain. ? Fluid or blood. ? Warmth. ? Pus or a bad smell. Bathing   Do not take baths, swim, or use a hot tub until your health care provider approves. Ask your health care provider if you can take showers. You may only be allowed to take sponge baths for bathing.  Keep your bandage (dressing) dry until your health care provider says it can be removed. Activity  Do not lift anything that is heavier than 10 lb (4.5 kg) until your health care provider approves.  Do not drive or use heavy machinery while taking prescription pain medicine. Ask your health care provider when it is safe for you to drive or use heavy machinery.  Do not drive for 24 hours if you were given a medicine to help you relax (sedative) during your procedure.  Rest as told by your health care provider. You may return to your normal activities when your health care provider approves. General instructions  Take over-the-counter and prescription medicines only as  told by your health care provider.  To prevent or treat constipation while you are taking prescription pain medicine, your health care provider may recommend that you: ? Take over-the-counter or prescription medicines. ? Eat foods that are high in fiber, such as fresh fruits and vegetables, whole grains, and beans. ? Limit foods that are high in fat and processed sugars, such as fried and sweet foods.  Drink enough fluid to keep your urine clear or pale yellow.  Hold a pillow over your abdomen when you cough or sneeze. This helps with pain.  Keep all follow-up visits as told by your health care provider. This is important. Contact a health care provider if:  You have: ? A fever or chills. ? Redness, swelling, or pain around your incision. ? Fluid or blood coming from your incision. ? Pus or a bad smell coming from your incision. ? Pain that gets worse or does not get better with medicine. ? Nausea or vomiting. ? A cough. ? Shortness of breath.  Your incision feels warm to the touch.  You have not had a bowel movement in three days.  You are not able to urinate. Get help right away if:  You have severe pain in your abdomen.  You have persistent nausea and vomiting.  You have redness, warmth, or pain in your leg.  You have chest pain.  You have trouble breathing. Summary  After this procedure, it is common to have pain, discomfort, or soreness.  Follow instructions from your health care provider about how to take care of your incision.  Check your incision area every day for signs of infection. Report any signs of infection to your health care provider.  Keep all follow-up visits as told by your health care provider. This is important. This information is not intended to replace advice given to you by your health care provider. Make sure you discuss any questions you have with your health care provider. Document Released: 04/23/2012 Document Revised: 12/28/2015  Document Reviewed: 12/28/2015 Elsevier Interactive Patient Education  2019 [employer].

## 2018-05-30 NOTE — Anesthesia Procedure Notes (Signed)
Procedure Name: Intubation Date/Time: 05/30/2018 11:34 AM Performed by: Garrel Ridgel, CRNA Pre-anesthesia Checklist: Patient identified, Emergency Drugs available, Suction available, Patient being monitored and Timeout performed Patient Re-evaluated:Patient Re-evaluated prior to induction Oxygen Delivery Method: Circle system utilized Preoxygenation: Pre-oxygenation with 100% oxygen Induction Type: IV induction Ventilation: Mask ventilation without difficulty Laryngoscope Size: Mac and 3 Grade View: Grade I Tube size: 7.5 mm Number of attempts: 1 Airway Equipment and Method: Stylet

## 2018-05-30 NOTE — Op Note (Signed)
SANDON YOHO  30-May-1950 30 May 2018    PCP:  Ria Bush, MD   Surgeon: Kaylyn Lim, MD, FACS  Asst:  none  Anes:  general  Preop Dx: Incarcerated umbilical hernia Postop Dx: Same containing omentum  Procedure: Lap assisted repair with ~6 cm Ventralex mesh patch Location Surgery: [facility] 1 Complications: None noted  EBL:   minimal cc  Drains: none  Description of Procedure:  The patient was taken to OR 1 .  After anesthesia was administered and the patient was prepped  with Technicare and a timeout was performed.  Access to the abdomen was achieved with a 5 mm Optiview through the left upper quadrant.  Following insufflation the omentum was noted to be incarcerated within the umbilical hernia.  A second 5 mm was placed in the left lower quadrant and through that the omentum was grasped and manually removed from the hernia sac.  A transverse infraumbilical incision was made beneath the attenuated skin of the umbilicus.  The skin was removed from the sac and then the sac was dissected free down to the neck.  The neck itself was about 2 cm in diameter or less.  The sac was then excised with the Bovie.  The fascial defect that remained was circular.  A 6+ centimeter ventral Lex mesh patch was then inserted and secured with 4 inverted horizontal mattress sutures incorporating the inner aspect of the mesh suturing into the abdominal wall.  1/5 suture was placed to close the defect primarily.  The secure strap absorbable stapler was used to tack the mesh with the laparoscope to the abdominal wall with 8 symmetrically placed tacks.  Exparel was then infiltrated into the wound.  The hernia sac skin was tacked to the fascia with a 4-0 Vicryl.  4-0 Vicryls were then used to approximate the skin of all the wounds and then Dermabond was applied.  Patient will have an abdominal binder and plans are for discharge.  The patient tolerated the procedure well and was taken to the PACU in stable  condition.     Matt B. Hassell Done, [place], [facility], [place]

## 2018-05-30 NOTE — Transfer of Care (Signed)
Immediate Anesthesia Transfer of Care Note  Patient: Martin Garza  Procedure(s) Performed: LAPAROSCOPIC ASSISTED  UMBILICAL HERNIA REPAIR WITH MESH,  ERAS PATHWAY (N/A Abdomen)  Patient Location: PACU  Anesthesia Type:General  Level of Consciousness: awake, alert  and oriented  Airway & Oxygen Therapy: Patient Spontanous Breathing and Patient connected to face mask oxygen  Post-op Assessment: Report given to RN and Post -op Vital signs reviewed and stable  Post vital signs: Reviewed and stable  Last Vitals:  Vitals Value Taken Time  BP 144/91 05/30/2018  1:00 PM  Temp    Pulse 77 05/30/2018  1:02 PM  Resp 18 05/30/2018  1:02 PM  SpO2 100 % 05/30/2018  1:02 PM  Vitals shown include unvalidated device data.  Last Pain:  Vitals:   05/30/18 0853  TempSrc:   PainSc: 0-No pain      Patients Stated Pain Goal: 3 (83/35/82 5189)  Complications: No apparent anesthesia complications

## 2018-05-30 NOTE — Anesthesia Preprocedure Evaluation (Addendum)
Anesthesia Evaluation  Patient identified by MRN, date of birth, ID band Patient awake    Reviewed: Allergy & Precautions, NPO status , Patient's Chart, lab work & pertinent test results  Airway Mallampati: II  TM Distance: >3 FB     Dental   Pulmonary neg pulmonary ROS,    breath sounds clear to auscultation       Cardiovascular  Rhythm:Regular Rate:Normal     Neuro/Psych    GI/Hepatic Neg liver ROS, History noted. CG   Endo/Other  negative endocrine ROS  Renal/GU negative Renal ROS     Musculoskeletal   Abdominal   Peds  Hematology   Anesthesia Other Findings   Reproductive/Obstetrics                            Lab Results  Component Value Date   WBC 7.4 05/28/2018   HGB 14.5 05/28/2018   HCT 43.4 05/28/2018   MCV 96.2 05/28/2018   PLT 258 05/28/2018    Anesthesia Physical Anesthesia Plan  ASA: II  Anesthesia Plan: General   Post-op Pain Management:    Induction: Intravenous  PONV Risk Score and Plan: 2 and Ondansetron, Dexamethasone, Midazolam and Treatment may vary due to age or medical condition  Airway Management Planned: Oral ETT  Additional Equipment:   Intra-op Plan:   Post-operative Plan: Extubation in OR  Informed Consent: I have reviewed the patients History and Physical, chart, labs and discussed the procedure including the risks, benefits and alternatives for the proposed anesthesia with the patient or authorized representative who has indicated his/her understanding and acceptance.   Dental advisory given  Plan Discussed with: CRNA and Anesthesiologist  Anesthesia Plan Comments:       Anesthesia Quick Evaluation

## 2018-05-30 NOTE — Interval H&P Note (Signed)
History and Physical Interval Note:  05/30/2018 10:53 AM  Martin Garza  has presented today for surgery, with the diagnosis of umbilical hernia  The various methods of treatment have been discussed with the patient and family. After consideration of risks, benefits and other options for treatment, the patient has consented to  Procedure(s): [place] (N/A) as a surgical intervention .  The patient's history has been reviewed, patient examined, no change in status, stable for surgery.  I have reviewed the patient's chart and labs.  Questions were answered to the patient's satisfaction.     [name]

## 2018-05-30 NOTE — Anesthesia Postprocedure Evaluation (Signed)
Anesthesia Post Note  Patient: Martin Garza  Procedure(s) Performed: LAPAROSCOPIC ASSISTED  UMBILICAL HERNIA REPAIR WITH MESH,  ERAS PATHWAY (N/A Abdomen)     Patient location during evaluation: PACU Anesthesia Type: General Level of consciousness: awake Pain management: pain level controlled Vital Signs Assessment: post-procedure vital signs reviewed and stable Respiratory status: spontaneous breathing Cardiovascular status: stable Postop Assessment: no apparent nausea or vomiting Anesthetic complications: no    Last Vitals:  Vitals:   05/30/18 1405 05/30/18 1450  BP: 137/86 (!) 152/89  Pulse: 73 76  Resp: 18 18  Temp:    SpO2: 100% 100%    Last Pain:  Vitals:   05/30/18 1450  TempSrc:   PainSc: 1                  Crystina Borrayo

## 2018-06-02 ENCOUNTER — Encounter (HOSPITAL_COMMUNITY): Payer: Self-pay | Admitting: Surgery

## 2018-08-18 ENCOUNTER — Other Ambulatory Visit: Payer: PPO

## 2018-11-09 ENCOUNTER — Emergency Department: Payer: PPO

## 2018-11-09 ENCOUNTER — Other Ambulatory Visit: Payer: Self-pay

## 2018-11-09 ENCOUNTER — Inpatient Hospital Stay
Admission: EM | Admit: 2018-11-09 | Discharge: 2018-11-12 | DRG: 247 | Disposition: A | Discharge status: Home / Self Care | Payer: PPO | Attending: Internal Medicine | Admitting: Internal Medicine

## 2018-11-09 ENCOUNTER — Encounter: Payer: Self-pay | Admitting: *Deleted

## 2018-11-09 DIAGNOSIS — R079 Chest pain, unspecified: Secondary | ICD-10-CM | POA: Diagnosis not present

## 2018-11-09 DIAGNOSIS — K219 Gastro-esophageal reflux disease without esophagitis: Secondary | ICD-10-CM | POA: Diagnosis present

## 2018-11-09 DIAGNOSIS — Z1159 Encounter for screening for other viral diseases: Secondary | ICD-10-CM | POA: Diagnosis not present

## 2018-11-09 DIAGNOSIS — Z7982 Long term (current) use of aspirin: Secondary | ICD-10-CM

## 2018-11-09 DIAGNOSIS — Z79899 Other long term (current) drug therapy: Secondary | ICD-10-CM | POA: Diagnosis not present

## 2018-11-09 DIAGNOSIS — Z87891 Personal history of nicotine dependence: Secondary | ICD-10-CM

## 2018-11-09 DIAGNOSIS — I249 Acute ischemic heart disease, unspecified: Secondary | ICD-10-CM | POA: Diagnosis present

## 2018-11-09 DIAGNOSIS — Z8249 Family history of ischemic heart disease and other diseases of the circulatory system: Secondary | ICD-10-CM

## 2018-11-09 DIAGNOSIS — I472 Ventricular tachycardia: Secondary | ICD-10-CM | POA: Diagnosis not present

## 2018-11-09 DIAGNOSIS — R7989 Other specified abnormal findings of blood chemistry: Secondary | ICD-10-CM | POA: Diagnosis not present

## 2018-11-09 DIAGNOSIS — E7800 Pure hypercholesterolemia, unspecified: Secondary | ICD-10-CM | POA: Diagnosis not present

## 2018-11-09 DIAGNOSIS — I214 Non-ST elevation (NSTEMI) myocardial infarction: Secondary | ICD-10-CM | POA: Diagnosis not present

## 2018-11-09 DIAGNOSIS — I10 Essential (primary) hypertension: Secondary | ICD-10-CM | POA: Diagnosis not present

## 2018-11-09 DIAGNOSIS — I200 Unstable angina: Secondary | ICD-10-CM

## 2018-11-09 DIAGNOSIS — Z9861 Coronary angioplasty status: Secondary | ICD-10-CM | POA: Diagnosis not present

## 2018-11-09 DIAGNOSIS — E785 Hyperlipidemia, unspecified: Secondary | ICD-10-CM | POA: Diagnosis not present

## 2018-11-09 DIAGNOSIS — I2510 Atherosclerotic heart disease of native coronary artery without angina pectoris: Secondary | ICD-10-CM | POA: Diagnosis not present

## 2018-11-09 DIAGNOSIS — Z20828 Contact with and (suspected) exposure to other viral communicable diseases: Secondary | ICD-10-CM | POA: Diagnosis not present

## 2018-11-09 DIAGNOSIS — R739 Hyperglycemia, unspecified: Secondary | ICD-10-CM | POA: Diagnosis present

## 2018-11-09 HISTORY — DX: Other specified health status: Z78.9

## 2018-11-09 LAB — BASIC METABOLIC PANEL
Anion gap: 9 (ref 5–15)
BUN: 23 mg/dL (ref 8–23)
CO2: 24 mmol/L (ref 22–32)
Calcium: 9.4 mg/dL (ref 8.9–10.3)
Chloride: 109 mmol/L (ref 98–111)
Creatinine, Ser: 1.23 mg/dL (ref 0.61–1.24)
GFR calc Af Amer: >60 mL/min (ref >60)
GFR calc non Af Amer: 60 mL/min — ABNORMAL LOW (ref >60)
Glucose, Bld: 122 mg/dL — ABNORMAL HIGH (ref 70–99)
Potassium: 3.8 mmol/L (ref 3.5–5.1)
Sodium: 142 mmol/L (ref 135–145)

## 2018-11-09 LAB — CBC
HCT: 41.8 % (ref 39.0–52.0)
Hemoglobin: 14.7 g/dL (ref 13.0–17.0)
MCH: 32.8 pg (ref 26.0–34.0)
MCHC: 35.2 g/dL (ref 30.0–36.0)
MCV: 93.3 fL (ref 80.0–100.0)
Platelets: 206 10*3/uL (ref 150–400)
RBC: 4.48 MIL/uL (ref 4.22–5.81)
RDW: 12.2 % (ref 11.5–15.5)
WBC: 7 10*3/uL (ref 4.0–10.5)
nRBC: 0 % (ref 0.0–0.2)

## 2018-11-09 LAB — PROTIME-INR
INR: 1 (ref 0.8–1.2)
Prothrombin Time: 12.7 seconds (ref 11.4–15.2)

## 2018-11-09 LAB — APTT: aPTT: 30 seconds (ref 24–36)

## 2018-11-09 LAB — SARS CORONAVIRUS 2 BY RT PCR (HOSPITAL ORDER, PERFORMED IN ~~LOC~~ HOSPITAL LAB): SARS Coronavirus 2: NEGATIVE

## 2018-11-09 LAB — TROPONIN I
Troponin I: 0.08 ng/mL (ref <0.03)
Troponin I: 0.32 ng/mL (ref <0.03)

## 2018-11-09 MED ORDER — ATORVASTATIN CALCIUM 20 MG PO TABS
80.0000 mg | ORAL_TABLET | Freq: Every day | ORAL | Status: DC
  Administered 2018-11-10 – 2018-11-11 (×2): 80 mg via ORAL
  Filled 2018-11-09 (×2): qty 4

## 2018-11-09 MED ORDER — ACETAMINOPHEN 325 MG PO TABS: 650.0000 mg | ORAL_TABLET | Freq: Four times a day (QID) | ORAL | Status: DC | PRN

## 2018-11-09 MED ORDER — ONDANSETRON HCL 4 MG/2ML IJ SOLN: 4.0000 mg | Freq: Four times a day (QID) | INTRAMUSCULAR | Status: DC | PRN

## 2018-11-09 MED ORDER — HEPARIN (PORCINE) 25000 UT/250ML-% IV SOLN
1150.0000 [IU]/h | INTRAVENOUS | Status: DC
  Administered 2018-11-09: 1150 [IU]/h via INTRAVENOUS
  Filled 2018-11-09: qty 250

## 2018-11-09 MED ORDER — NITROGLYCERIN 0.4 MG SL SUBL: 0.4000 mg | SUBLINGUAL_TABLET | SUBLINGUAL | Status: DC | PRN

## 2018-11-09 MED ORDER — ASPIRIN EC 81 MG PO TBEC
81.0000 mg | DELAYED_RELEASE_TABLET | Freq: Every day | ORAL | Status: DC
  Administered 2018-11-10 – 2018-11-12 (×3): 81 mg via ORAL
  Filled 2018-11-09 (×3): qty 1

## 2018-11-09 MED ORDER — SODIUM CHLORIDE 0.9% FLUSH
3.0000 mL | Freq: Once | INTRAVENOUS | Status: AC
  Administered 2018-11-09: 3 mL via INTRAVENOUS

## 2018-11-09 MED ORDER — ACETAMINOPHEN 325 MG PO TABS
650.0000 mg | ORAL_TABLET | ORAL | Status: DC | PRN
  Administered 2018-11-11 (×3): 650 mg via ORAL
  Filled 2018-11-09 (×3): qty 2

## 2018-11-09 MED ORDER — HYDROCODONE-ACETAMINOPHEN 5-325 MG PO TABS
1.0000 | ORAL_TABLET | Freq: Four times a day (QID) | ORAL | Status: DC | PRN
  Administered 2018-11-10: 1 via ORAL
  Filled 2018-11-09: qty 1

## 2018-11-09 MED ORDER — HEPARIN BOLUS VIA INFUSION
4000.0000 [IU] | Freq: Once | INTRAVENOUS | Status: AC
  Administered 2018-11-09: 4000 [IU] via INTRAVENOUS
  Filled 2018-11-09: qty 4000

## 2018-11-09 MED ORDER — ASPIRIN 81 MG PO CHEW
324.0000 mg | CHEWABLE_TABLET | Freq: Once | ORAL | Status: DC
  Filled 2018-11-09: qty 4

## 2018-11-09 MED ORDER — VITAMIN C 500 MG PO TABS
1000.0000 mg | ORAL_TABLET | Freq: Every day | ORAL | Status: DC
  Administered 2018-11-10 – 2018-11-12 (×3): 1000 mg via ORAL
  Filled 2018-11-09 (×4): qty 2

## 2018-11-09 MED ORDER — ASPIRIN 81 MG PO CHEW
243.0000 mg | CHEWABLE_TABLET | Freq: Once | ORAL | Status: AC
  Administered 2018-11-09: 18:00:00 243 mg via ORAL

## 2018-11-09 MED ORDER — METOPROLOL TARTRATE 25 MG PO TABS
12.5000 mg | ORAL_TABLET | Freq: Two times a day (BID) | ORAL | Status: DC
  Administered 2018-11-09 – 2018-11-12 (×6): 12.5 mg via ORAL
  Filled 2018-11-09 (×6): qty 1

## 2018-11-09 MED ORDER — FAMOTIDINE IN NACL 20-0.9 MG/50ML-% IV SOLN
20.0000 mg | INTRAVENOUS | Status: DC
  Administered 2018-11-09: 20:00:00 20 mg via INTRAVENOUS
  Filled 2018-11-09: qty 50

## 2018-11-09 MED ORDER — VITAMIN B-12 1000 MCG PO TABS
1000.0000 ug | ORAL_TABLET | Freq: Every day | ORAL | Status: DC
  Administered 2018-11-10 – 2018-11-12 (×3): 1000 ug via ORAL
  Filled 2018-11-09 (×4): qty 1

## 2018-11-09 MED ORDER — SODIUM CHLORIDE 0.9 % IV SOLN
INTRAVENOUS | Status: DC
  Administered 2018-11-10: 10:00:00 via INTRAVENOUS
  Administered 2018-11-11: 1000 mL via INTRAVENOUS

## 2018-11-09 NOTE — H&P (Signed)
[place] [facility] [place] NAME: Martin Garza    MR#:  [id number]  DATE OF BIRTH:  [birth year]  DATE OF ADMISSION:  11/09/2018  PRIMARY CARE PHYSICIAN: Ria Bush, MD   REQUESTING/REFERRING PHYSICIAN: Dr Charlotte Crumb  CHIEF COMPLAINT:   Chief Complaint  Patient presents with  . Chest Pain    HISTORY OF PRESENT ILLNESS:  Martin Garza  is a 68 y.o. male presents with chest pain going on and off for the past 2 to 3 days.  He felt worse today.  Previously he has developed chest pain when he was doing something.  When he sits that eases off.  Today he started hurting even at rest.  Earlier today was 8 out of 10 in intensity.  Prior to that usually around 4-5 out of 10 in intensity.  He took some aspirin today.  Pain described as a burning sensation up into his chest radiating to both arms.  He feels clammy.  No shortness of breath or nausea or vomiting.  Feels like he also has indigestion.  In the ER his first troponin was borderline elevated at 0.08.  Hospitalist services were contacted for further evaluation.  PAST MEDICAL HISTORY:   Past Medical History:  Diagnosis Date  . Allergy   . Cataract    had surg in both eyes  . Complication of anesthesia    Blood pressure goes up after waking up past sedation/ gets dizzy too per pt!  . Medical history non-contributory   . Retinal detachment 2014   left  . Umbilical hernia     PAST SURGICAL HISTORY:   Past Surgical History:  Procedure Laterality Date  . CATARACT EXTRACTION  2018   Bil  . COLONOSCOPY  04/2018   TA, SSP, diverticulosis, rpt 3 yrs ([facility]); with polypectom y  . Left inguinal repair  in High School  . RETINAL DETACHMENT SURGERY Left    x 2 in 2014  . UMBILICAL HERNIA REPAIR N/A 05/30/2018   Procedure: LAPAROSCOPIC ASSISTED  UMBILICAL HERNIA REPAIR WITH MESH,  ERAS PATHWAY;  Surgeon: Johnathan Hausen, MD;  Location: [facility];  Service: General;  Laterality: N/A;     SOCIAL HISTORY:   Social History   Tobacco Use  . Smoking status: Never Smoker  . Smokeless tobacco: Former Systems developer    Types: Chew  Substance Use Topics  . Alcohol use: Yes    Alcohol/week: 1.0 standard drinks    Types: 1 Glasses of wine per week    Comment: rare    FAMILY HISTORY:   Family History  Problem Relation Age of Onset  . Cancer Mother 57       metastatic cancer, smoker  . Coronary artery disease Father 70       MI with CABG, smoker  . Stroke Father        after catheterization  . Alcohol abuse Father   . Alcohol abuse Brother   . Coronary artery disease Brother 76       CABG, smoker  . Depression Brother   . Drug abuse Brother   . Hearing loss Daughter   . Cancer Maternal Aunt        breast  . Breast cancer Maternal Aunt   . Diabetes Neg Hx   . Colon cancer Neg Hx   . Rectal cancer Neg Hx   . Stomach cancer Neg Hx     DRUG ALLERGIES:  No Known Allergies  REVIEW OF SYSTEMS:  CONSTITUTIONAL: No fever, positive for clammy feeling.  Positive for fatigue.  EYES: No blurred or double vision.  EARS, NOSE, AND THROAT: No tinnitus or ear pain. No sore throat RESPIRATORY: No cough, shortness of breath, wheezing or hemoptysis.  CARDIOVASCULAR: Positive for chest pain.  No orthopnea, edema.  GASTROINTESTINAL: No nausea, vomiting, diarrhea or abdominal pain. No blood in bowel movements GENITOURINARY: No dysuria, hematuria.  ENDOCRINE: No polyuria, nocturia,  HEMATOLOGY: No anemia, easy bruising or bleeding SKIN: No rash or lesion. MUSCULOSKELETAL: No joint pain or arthritis.   NEUROLOGIC: No tingling, numbness, weakness.  PSYCHIATRY: No anxiety or depression.   MEDICATIONS AT HOME:   Prior to Admission medications   Medication Sig Start Date End Date Taking? Authorizing Provider  acetaminophen (TYLENOL) 325 MG tablet Take 650 mg by mouth every 6 (six) hours as needed for moderate pain.     [provider]  Cyanocobalamin (B-12 PO) Take 1,000  mcg by mouth daily.    [provider]  HYDROcodone-acetaminophen (NORCO/VICODIN) 5-325 MG tablet Take 1 tablet by mouth every 6 (six) hours as needed for moderate pain. 05/30/18   Johnathan Hausen, MD  ibuprofen (ADVIL,MOTRIN) 200 MG tablet Take 400 mg by mouth every 8 (eight) hours as needed for headache or moderate pain.     [provider]  vitamin C (ASCORBIC ACID) 500 MG tablet Take 1,000 mg by mouth daily.    [provider]      VITAL SIGNS:  Blood pressure (!) 148/85, pulse 77, temperature 98.2 F (36.8 C), temperature source Oral, resp. rate 16, height 5\' 10"  (1.778 m), weight 87.5 kg, SpO2 97 %.  PHYSICAL EXAMINATION:  GENERAL:  68 y.o.-year-old patient lying in the bed with no acute distress.  EYES: Pupils equal, round, reactive to light and accommodation. No scleral icterus. Extraocular muscles intact.  HEENT: Head atraumatic, normocephalic. Oropharynx and nasopharynx clear.  NECK:  Supple, no jugular venous distention. No thyroid enlargement, no tenderness.  LUNGS: Normal breath sounds bilaterally, no wheezing, rales,rhonchi or crepitation. No use of accessory muscles of respiration.  CARDIOVASCULAR: S1, S2 normal. No murmurs, rubs, or gallops.  ABDOMEN: Soft, nontender, nondistended. Bowel sounds present. No organomegaly or mass.  EXTREMITIES: No pedal edema, cyanosis, or clubbing.  NEUROLOGIC: Cranial nerves II through XII are intact. Muscle strength 5/5 in all extremities. Sensation intact. Gait not checked.  PSYCHIATRIC: The patient is alert and oriented x 3.  SKIN: No rash, lesion, or ulcer.   LABORATORY PANEL:   CBC Recent Labs  Lab 11/09/18 1627  WBC 7.0  HGB 14.7  HCT 41.8  PLT 206   ------------------------------------------------------------------------------------------------------------------  Chemistries  Recent Labs  Lab 11/09/18 1627  NA 142  K 3.8  CL 109  CO2 24  GLUCOSE 122*  BUN 23  CREATININE 1.23  CALCIUM 9.4    ------------------------------------------------------------------------------------------------------------------  Cardiac Enzymes Recent Labs  Lab 11/09/18 1627  TROPONINI 0.08*   ------------------------------------------------------------------------------------------------------------------  RADIOLOGY:  Dg Chest 2 View  Result Date: 11/09/2018 CLINICAL DATA:  Acute chest pain EXAM: CHEST - 2 VIEW COMPARISON:  None. FINDINGS: The cardiomediastinal silhouette is unremarkable. There is no evidence of focal airspace disease, pulmonary edema, suspicious pulmonary nodule/mass, pleural effusion, or pneumothorax. No acute bony abnormalities are identified. IMPRESSION: No active cardiopulmonary disease. Electronically Signed   By: Margarette Canada M.D.   On: 11/09/2018 17:38    EKG:   Normal sinus rhythm 75 bpm, Q waves in V1. IMPRESSION AND PLAN:   1.  Acute coronary syndrome.  Symptoms worsening over the past few days.  Borderline elevated troponin.  Started on aspirin, heparin drip beta-blocker and Lipitor.  ER physician spoke with Dr. Garwin Brothers from [facility] cardiology.  Will make n.p.o. after midnight for possible cardiac catheterization.  Serial troponins.  Monitor on telemetry.  Positive family history with father having heart disease and passing away at age 46.  We will also get an echocardiogram. 2.  GERD we will put on Pepcid IV    All the records are reviewed and case discussed with ED provider. Management plans discussed with the patient, family and they are in agreement.  CODE STATUS: Full code  TOTAL TIME TAKING CARE OF THIS PATIENT: 50 minutes.    Loletha Grayer M.D on 11/09/2018 at 6:31 PM  Between 7am to 6pm - Pager - [phone]  After 6pm call admission pager [phone]  [employer] [facility]  [phone]  CC: Primary care physician; Ria Bush, MD

## 2018-11-09 NOTE — Consult Note (Signed)
ANTICOAGULATION CONSULT NOTE - Initial Consult  Pharmacy Consult for Heparin Indication: chest pain/ACS/STEMI  No Known Allergies  Patient Measurements: Height: 5\' 10"  (177.8 cm) Weight: 192 lb 14.4 oz (87.5 kg) IBW/kg (Calculated) : 73 Heparin Dosing Weight: 87.5 kg  Vital Signs: Temp: 98.2 F (36.8 C) (06/21 1623) Temp Source: Oral (06/21 1623) BP: 148/85 (06/21 1623) Pulse Rate: 77 (06/21 1623)  Labs: Recent Labs    11/09/18 1627  HGB 14.7  HCT 41.8  PLT 206  CREATININE 1.23  TROPONINI 0.08*    Estimated Creatinine Clearance: 58.5 mL/min (by C-G formula based on SCr of 1.23 mg/dL).   Medical History: Past Medical History:  Diagnosis Date  . Allergy   . Cataract    had surg in both eyes  . Complication of anesthesia    Blood pressure goes up after waking up past sedation/ gets dizzy too per pt!  . Retinal detachment 2014   left  . Umbilical hernia     Medications:  No PTA anticoagulation of record  Assessment: 68 yo male with unrelenting chest pain even at rest.  Goal of Therapy:  Heparin level 0.3-0.7 units/ml Monitor platelets by anticoagulation protocol: Yes   Plan:  Give 4000 units bolus x 1, followed by 1150 units/hour - Will check Heparin Level (HL) in 6 hours per protocol.  Lu Duffel, PharmD, BCPS Clinical Pharmacist 11/09/2018 5:45 PM

## 2018-11-09 NOTE — ED Triage Notes (Signed)
Burning chest pain intermittently over the past 4 days. Pain is worse with exertion. Pt also reports noting his BP elevates when the pain is worse. No SOB, dizziness, lightheadedness or other symptoms at this time. Pt is not radiating in nature.

## 2018-11-09 NOTE — ED Provider Notes (Signed)
[facility] Emergency Department Provider Note  ____________________________________________   None    (approximate)  I have reviewed the triage vital signs and the nursing notes.   HISTORY  Chief Complaint Chest Pain    HPI Martin Garza is a 68 y.o. male presents emergency department complaining of chest pain intermittently over the past 4 days.  He states originally it was just with exertion.  Today it was with rest.  Patient states that he also got clammy underneath his arms and his shirt was damp after the onset of chest pain.  No radiation of pain.  Strong family history of cardiac disease for most men in his family have died before the age of 54 due to heart attacks.  He states he has high cholesterol but he has been trying to lose weight and eat properly.  He is not on a statin.  He denies taking Viagra.  Non-smoker.  Past Medical History:  Diagnosis Date  . Allergy   . Cataract    had surg in both eyes  . Complication of anesthesia    Blood pressure goes up after waking up past sedation/ gets dizzy too per pt!  . Retinal detachment 2014   left  . Umbilical hernia     Patient Active Problem List   Diagnosis Date Noted  . ACS (acute coronary syndrome) ([place]) 11/09/2018  . Health maintenance examination 02/11/2017  . Right foot pain 02/09/2016  . Umbilical hernia 33/54/5625  . Advanced care planning/counseling discussion 01/31/2015  . Medicare annual wellness visit, initial 01/29/2014  . Retinal detachment   . Family history of early CAD 02/19/2012  . HLD (hyperlipidemia) 04/27/2009    Past Surgical History:  Procedure Laterality Date  . CATARACT EXTRACTION  2018   Bil  . COLONOSCOPY  04/2018   TA, SSP, diverticulosis, rpt 3 yrs ([facility]); with polypectom y  . Left inguinal repair  in High School  . RETINAL DETACHMENT SURGERY Left    x 2 in 2014  . UMBILICAL HERNIA REPAIR N/A 05/30/2018   Procedure: LAPAROSCOPIC ASSISTED  UMBILICAL  HERNIA REPAIR WITH MESH,  ERAS PATHWAY;  Surgeon: Johnathan Hausen, MD;  Location: [facility];  Service: General;  Laterality: N/A;    Prior to Admission medications   Medication Sig Start Date End Date Taking? Authorizing Provider  acetaminophen (TYLENOL) 325 MG tablet Take 650 mg by mouth every 6 (six) hours as needed for moderate pain.     [provider]  Cyanocobalamin (B-12 PO) Take 1,000 mcg by mouth daily.    [provider]  HYDROcodone-acetaminophen (NORCO/VICODIN) 5-325 MG tablet Take 1 tablet by mouth every 6 (six) hours as needed for moderate pain. 05/30/18   Johnathan Hausen, MD  ibuprofen (ADVIL,MOTRIN) 200 MG tablet Take 400 mg by mouth every 8 (eight) hours as needed for headache or moderate pain.     [provider]  vitamin C (ASCORBIC ACID) 500 MG tablet Take 1,000 mg by mouth daily.    [provider]    Allergies Patient has no known allergies.  Family History  Problem Relation Age of Onset  . Cancer Mother 29       metastatic cancer, smoker  . Coronary artery disease Father 71       MI with CABG, smoker  . Stroke Father        after catheterization  . Alcohol abuse Father   . Alcohol abuse Brother   . Coronary artery disease Brother 33  CABG, smoker  . Depression Brother   . Drug abuse Brother   . Hearing loss Daughter   . Cancer Maternal Aunt        breast  . Breast cancer Maternal Aunt   . Diabetes Neg Hx   . Colon cancer Neg Hx   . Rectal cancer Neg Hx   . Stomach cancer Neg Hx     Social History Social History   Tobacco Use  . Smoking status: Never Smoker  . Smokeless tobacco: Former Systems developer    Types: Chew  Substance Use Topics  . Alcohol use: Yes    Alcohol/week: 1.0 standard drinks    Types: 1 Glasses of wine per week    Comment: rare  . Drug use: No    Review of Systems  Constitutional: No fever/chills Eyes: No visual changes. ENT: No sore throat. Respiratory: Denies cough Cardiovascular: Positive  for chest pain Genitourinary: Negative for dysuria. Musculoskeletal: Negative for back pain. Skin: Negative for rash.    ____________________________________________   PHYSICAL EXAM:  VITAL SIGNS: ED Triage Vitals [11/09/18 1623]  Enc Vitals Group     BP (!) 148/85     Pulse Rate 77     Resp 16     Temp 98.2 F (36.8 C)     Temp Source Oral     SpO2 97 %     Weight 192 lb 14.4 oz (87.5 kg)     Height 5\' 10"  (1.778 m)     Head Circumference      Peak Flow      Pain Score 2     Pain Loc      Pain Edu?      Excl. in [place]?     Constitutional: Alert and oriented. Well appearing and in no acute distress. Eyes: Conjunctivae are normal.  Head: Atraumatic. Nose: No congestion/rhinnorhea. Mouth/Throat: Mucous membranes are moist.   Neck:  supple no lymphadenopathy noted Cardiovascular: Normal rate, regular rhythm. Heart sounds are normal Respiratory: Normal respiratory effort.  No retractions, lungs c t a  Abd: soft nontender bs normal all 4 quad, no pulsatile mass GU: deferred Musculoskeletal: FROM all extremities, warm and well perfused Neurologic:  Normal speech and language.  Skin:  Skin is warm, dry and intact. No rash noted. Psychiatric: Mood and affect are normal. Speech and behavior are normal.  ____________________________________________   LABS (all labs ordered are listed, but only abnormal results are displayed)  Labs Reviewed  BASIC METABOLIC PANEL - Abnormal; Notable for the following components:      Result Value   Glucose, Bld 122 (*)    GFR calc non Af Amer 60 (*)    All other components within normal limits  TROPONIN I - Abnormal; Notable for the following components:   Troponin I 0.08 (*)    All other components within normal limits  SARS CORONAVIRUS 2 (HOSPITAL ORDER, [place] [facility])  CBC  PROTIME-INR  APTT  HEPARIN LEVEL (UNFRACTIONATED)  TROPONIN I  TROPONIN I  LIPID PANEL    ____________________________________________   ____________________________________________  RADIOLOGY  Chest x-ray appears normal  ____________________________________________   PROCEDURES  Procedure(s) performed: EKG is normal sinus rhythm , ASA 324 mg, heparin per pharmacy  Procedures    ____________________________________________   INITIAL IMPRESSION / ASSESSMENT AND PLAN / ED COURSE  Pertinent labs & imaging results that were available during my care of the patient were reviewed by me and considered in my medical decision making (see  chart for details).   Patient 68 year old male presents emergency department with intermittent chest pain on exertion and today had chest pain at rest.  Also became clammy with the pain.  Denies shortness of breath.  Denies radiation.  Denies nausea or vomiting.  He has strong family history of cardiac disease most men in his family have died before the age 62 due to MIs.  Physical exam patient appears well.  He is in no discomfort at this time.  Lungs are clear to all station, heart sounds normal.  No pedal edema is noted.  CBC is normal, basic metabolic panel is normal, troponin is elevated at 0.08, PT and PTT are pending, coag test pending  EKG showed normal sinus rhythm. Chest x-ray appears normal  Dr. Burlene Arnt and see the patient.  Agrees patient is unstable angina.  [facility] cardiology paged. Advised of pt's status.  Paged hospitalist will admit patient.  Martin Garza was evaluated in Emergency Department on 11/09/2018 for the symptoms described in the history of present illness. He was evaluated in the context of the global COVID-19 pandemic, which necessitated consideration that the patient might be at risk for infection with the SARS-CoV-2 virus that causes COVID-19. Institutional protocols and algorithms that pertain to the evaluation of patients at risk for COVID-19 are in a state of rapid change based on information released by  regulatory bodies including the CDC and federal and state organizations. These policies and algorithms were followed during the patient's care in the ED.   As part of my medical decision making, I reviewed the following data within the [place] notes reviewed and incorporated, Labs reviewed see above, EKG interpreted NSR, Old chart reviewed, Radiograph reviewed chest x-ray is normal, Discussed with admitting physician Bobbye [place], A consult was requested and obtained from this/these consultant(s) Cardiology, Evaluated by EM attending Dr. Burlene Arnt, Notes from prior ED visits and [place] Controlled Substance Database  ____________________________________________   FINAL CLINICAL IMPRESSION(S) / ED DIAGNOSES  Final diagnoses:  Unstable angina ([place])      NEW MEDICATIONS STARTED DURING THIS VISIT:  New Prescriptions   No medications on file     Note:  This document was prepared using Dragon voice recognition software and may include unintentional dictation errors.    Versie Starks, PA-C 11/09/18 1745    Schuyler Amor, MD 11/09/18 [phone]

## 2018-11-09 NOTE — ED Notes (Signed)
Patient up to rest room.  No acute distress noted

## 2018-11-09 NOTE — ED Provider Notes (Addendum)
-----------------------------------------   5:34 PM on 11/09/2018 -----------------------------------------  Patient with history of ACS in his family, quite significantly, history of hypercholesterolemia, states that over the last for 5 days he has been having substernal nonradiating chest pain, with exertion which resolves when he stops walking around and then today he had it at rest.  At maximum it was "pretty significant".  He took a baby aspirin on the way in.  Rest however at this time the pain is completely gone and he is very comfortable.  He denies any tearing pain to his back no pleuritic pain no personal family history of PE or DVT no leg swelling, at this time he has no complaints.  However this is the first time he has had this pain at rest.  He was going to try to wait to see his doctor tomorrow but elected to today because his pain happened while he was at rest.  Seen in conjunction with the PA.  We will heparinize him for what I believe to be unstable angina.  Low suspicion for PE or dissection.  We will talk to cardiology and get him admitted.  We will also supplement the aspirin he only took 81 mg at home.  Discussed with Dr. Angelena Form we discussed patient's history and physical and findings, he agrees with management and admission.  Patient did request this cardiologist.  CRITICAL CARE Performed by: Schuyler Amor   Total critical care time: 43 minutes  Critical care time was exclusive of separately billable procedures and treating other patients.  Critical care was necessary to treat or prevent imminent or life-threatening deterioration.  Critical care was time spent personally by me on the following activities: development of treatment plan with patient and/or surrogate as well as nursing, discussions with consultants, evaluation of patient's response to treatment, examination of patient, obtaining history from patient or surrogate, ordering and performing treatments and  interventions, ordering and review of laboratory studies, ordering and review of radiographic studies, pulse oximetry and re-evaluation of patient's condition.    Schuyler Amor, MD 11/09/18 1736    Schuyler Amor, MD 11/09/18 [phone]

## 2018-11-10 ENCOUNTER — Inpatient Hospital Stay (HOSPITAL_COMMUNITY)
Admit: 2018-11-10 | Discharge: 2018-11-10 | Disposition: A | Discharge status: Home / Self Care | Payer: PPO | Attending: Internal Medicine | Admitting: Internal Medicine

## 2018-11-10 ENCOUNTER — Telehealth: Payer: Self-pay

## 2018-11-10 ENCOUNTER — Other Ambulatory Visit: Payer: Self-pay

## 2018-11-10 ENCOUNTER — Encounter
Admission: EM | Disposition: A | Discharge status: Home / Self Care | Payer: Self-pay | Source: Home / Self Care | Attending: Internal Medicine

## 2018-11-10 DIAGNOSIS — I214 Non-ST elevation (NSTEMI) myocardial infarction: Principal | ICD-10-CM

## 2018-11-10 DIAGNOSIS — R079 Chest pain, unspecified: Secondary | ICD-10-CM

## 2018-11-10 DIAGNOSIS — I2510 Atherosclerotic heart disease of native coronary artery without angina pectoris: Secondary | ICD-10-CM

## 2018-11-10 DIAGNOSIS — E785 Hyperlipidemia, unspecified: Secondary | ICD-10-CM

## 2018-11-10 HISTORY — PX: LEFT HEART CATH AND CORONARY ANGIOGRAPHY: CATH118249

## 2018-11-10 HISTORY — PX: CORONARY STENT INTERVENTION: CATH118234

## 2018-11-10 LAB — LIPID PANEL
Cholesterol: 226 mg/dL — ABNORMAL HIGH (ref 0–200)
HDL: 44 mg/dL (ref >40)
LDL Cholesterol: 166 mg/dL — ABNORMAL HIGH (ref 0–99)
Total CHOL/HDL Ratio: 5.1 RATIO
Triglycerides: 81 mg/dL (ref <150)
VLDL: 16 mg/dL (ref 0–40)

## 2018-11-10 LAB — HEPARIN LEVEL (UNFRACTIONATED)
Heparin Unfractionated: 0.46 IU/mL (ref 0.30–0.70)
Heparin Unfractionated: 0.56 IU/mL (ref 0.30–0.70)

## 2018-11-10 LAB — CBC
HCT: 39.4 % (ref 39.0–52.0)
Hemoglobin: 13.4 g/dL (ref 13.0–17.0)
MCH: 32.5 pg (ref 26.0–34.0)
MCHC: 34 g/dL (ref 30.0–36.0)
MCV: 95.6 fL (ref 80.0–100.0)
Platelets: 203 10*3/uL (ref 150–400)
RBC: 4.12 MIL/uL — ABNORMAL LOW (ref 4.22–5.81)
RDW: 12.6 % (ref 11.5–15.5)
WBC: 8.6 10*3/uL (ref 4.0–10.5)
nRBC: 0 % (ref 0.0–0.2)

## 2018-11-10 LAB — ECHOCARDIOGRAM COMPLETE
Height: 70 in
Weight: 3086.44 oz

## 2018-11-10 LAB — TROPONIN I
Troponin I: 0.56 ng/mL (ref <0.03)
Troponin I: 0.74 ng/mL (ref <0.03)
Troponin I: 1.3 ng/mL (ref <0.03)

## 2018-11-10 LAB — GLUCOSE, CAPILLARY: Glucose-Capillary: 135 mg/dL — ABNORMAL HIGH (ref 70–99)

## 2018-11-10 LAB — MRSA PCR SCREENING: MRSA by PCR: NEGATIVE

## 2018-11-10 SURGERY — LEFT HEART CATH AND CORONARY ANGIOGRAPHY
Anesthesia: Moderate Sedation

## 2018-11-10 MED ORDER — PRASUGREL HCL 10 MG PO TABS
ORAL_TABLET | ORAL | Status: AC
  Filled 2018-11-10: qty 6

## 2018-11-10 MED ORDER — TIROFIBAN HCL IV 12.5 MG/250 ML: 0.0750 ug/kg/min | INTRAVENOUS | Status: DC

## 2018-11-10 MED ORDER — SODIUM CHLORIDE 0.9% FLUSH
3.0000 mL | Freq: Two times a day (BID) | INTRAVENOUS | Status: DC
  Administered 2018-11-11 – 2018-11-12 (×3): 3 mL via INTRAVENOUS

## 2018-11-10 MED ORDER — ACETAMINOPHEN 325 MG PO TABS
ORAL_TABLET | ORAL | Status: AC
  Filled 2018-11-10: qty 2

## 2018-11-10 MED ORDER — MORPHINE SULFATE (PF) 2 MG/ML IV SOLN
INTRAVENOUS | Status: AC
  Administered 2018-11-10: 1 mg via INTRAVENOUS
  Filled 2018-11-10: qty 1

## 2018-11-10 MED ORDER — MIDAZOLAM HCL 2 MG/2ML IJ SOLN
INTRAMUSCULAR | Status: DC | PRN
  Administered 2018-11-10: 1 mg via INTRAVENOUS

## 2018-11-10 MED ORDER — HEPARIN SODIUM (PORCINE) 1000 UNIT/ML IJ SOLN
INTRAMUSCULAR | Status: AC
  Filled 2018-11-10: qty 1

## 2018-11-10 MED ORDER — SODIUM CHLORIDE 0.9% FLUSH: 3.0000 mL | Freq: Two times a day (BID) | INTRAVENOUS | Status: DC

## 2018-11-10 MED ORDER — HEPARIN (PORCINE) IN NACL 1000-0.9 UT/500ML-% IV SOLN
INTRAVENOUS | Status: AC
  Filled 2018-11-10: qty 1000

## 2018-11-10 MED ORDER — MORPHINE SULFATE (PF) 4 MG/ML IV SOLN
INTRAVENOUS | Status: AC
  Filled 2018-11-10: qty 1

## 2018-11-10 MED ORDER — ADENOSINE (DIAGNOSTIC) FOR INTRACORONARY USE
INTRAVENOUS | Status: DC | PRN
  Administered 2018-11-10 (×4): 72 ug via INTRACORONARY

## 2018-11-10 MED ORDER — HYDRALAZINE HCL 20 MG/ML IJ SOLN: 10.0000 mg | INTRAMUSCULAR | Status: AC | PRN

## 2018-11-10 MED ORDER — MORPHINE SULFATE (PF) 4 MG/ML IV SOLN
2.0000 mg | INTRAVENOUS | Status: DC | PRN
  Administered 2018-11-10: 2 mg via INTRAVENOUS
  Filled 2018-11-10: qty 1

## 2018-11-10 MED ORDER — FAMOTIDINE 20 MG PO TABS
20.0000 mg | ORAL_TABLET | Freq: Every day | ORAL | Status: DC
  Administered 2018-11-10 – 2018-11-12 (×3): 20 mg via ORAL
  Filled 2018-11-10 (×3): qty 1

## 2018-11-10 MED ORDER — PRASUGREL HCL 10 MG PO TABS
10.0000 mg | ORAL_TABLET | Freq: Every day | ORAL | Status: DC
  Administered 2018-11-11 – 2018-11-12 (×2): 10 mg via ORAL
  Filled 2018-11-10 (×2): qty 1

## 2018-11-10 MED ORDER — TIROFIBAN (AGGRASTAT) BOLUS VIA INFUSION
INTRAVENOUS | Status: DC | PRN
  Administered 2018-11-10: 2187.5 ug via INTRAVENOUS

## 2018-11-10 MED ORDER — ATROPINE SULFATE 1 MG/10ML IJ SOSY
PREFILLED_SYRINGE | INTRAMUSCULAR | Status: AC
  Filled 2018-11-10: qty 10

## 2018-11-10 MED ORDER — MORPHINE SULFATE (PF) 4 MG/ML IV SOLN
INTRAVENOUS | Status: DC | PRN
  Administered 2018-11-10: 2 mg via INTRAVENOUS

## 2018-11-10 MED ORDER — SODIUM CHLORIDE 0.9 % IV SOLN: 250.0000 mL | INTRAVENOUS | Status: DC | PRN

## 2018-11-10 MED ORDER — VERAPAMIL HCL 2.5 MG/ML IV SOLN
INTRAVENOUS | Status: AC
  Filled 2018-11-10: qty 2

## 2018-11-10 MED ORDER — ONDANSETRON HCL 4 MG/2ML IJ SOLN
INTRAMUSCULAR | Status: DC | PRN
  Administered 2018-11-10: 4 mg via INTRAVENOUS

## 2018-11-10 MED ORDER — TIROFIBAN HCL IV 12.5 MG/250 ML
0.0750 ug/kg/min | INTRAVENOUS | Status: DC
  Administered 2018-11-10: 0.075 ug/kg/min via INTRAVENOUS
  Filled 2018-11-10: qty 250

## 2018-11-10 MED ORDER — HEPARIN SODIUM (PORCINE) 1000 UNIT/ML IJ SOLN
INTRAMUSCULAR | Status: DC | PRN
  Administered 2018-11-10: 4500 [IU] via INTRAVENOUS
  Administered 2018-11-10: 2000 [IU] via INTRAVENOUS

## 2018-11-10 MED ORDER — IOHEXOL 300 MG/ML  SOLN
INTRAMUSCULAR | Status: DC | PRN
  Administered 2018-11-10: 240 mL via INTRA_ARTERIAL

## 2018-11-10 MED ORDER — NITROGLYCERIN IN D5W 200-5 MCG/ML-% IV SOLN
0.0000 ug/min | INTRAVENOUS | Status: DC
  Administered 2018-11-10: 25 ug/min via INTRAVENOUS

## 2018-11-10 MED ORDER — SODIUM CHLORIDE 0.9% FLUSH: 3.0000 mL | INTRAVENOUS | Status: DC | PRN

## 2018-11-10 MED ORDER — PRASUGREL HCL 10 MG PO TABS
ORAL_TABLET | ORAL | Status: DC | PRN
  Administered 2018-11-10: 60 mg via ORAL

## 2018-11-10 MED ORDER — FENTANYL CITRATE (PF) 100 MCG/2ML IJ SOLN
INTRAMUSCULAR | Status: AC
  Filled 2018-11-10: qty 2

## 2018-11-10 MED ORDER — TIROFIBAN HCL IN NACL 5-0.9 MG/100ML-% IV SOLN
INTRAVENOUS | Status: AC | PRN
  Administered 2018-11-10: 0.075 ug/kg/min via INTRAVENOUS

## 2018-11-10 MED ORDER — VERAPAMIL HCL 2.5 MG/ML IV SOLN
INTRAVENOUS | Status: DC | PRN
  Administered 2018-11-10: 2.5 mg via INTRA_ARTERIAL

## 2018-11-10 MED ORDER — SODIUM CHLORIDE 0.9 % WEIGHT BASED INFUSION: 1.0000 mL/kg/h | INTRAVENOUS | Status: DC

## 2018-11-10 MED ORDER — SODIUM CHLORIDE 0.9 % WEIGHT BASED INFUSION
3.0000 mL/kg/h | INTRAVENOUS | Status: DC
  Administered 2018-11-10: 3 mL/kg/h via INTRAVENOUS

## 2018-11-10 MED ORDER — NITROGLYCERIN IN D5W 200-5 MCG/ML-% IV SOLN
INTRAVENOUS | Status: AC | PRN
  Administered 2018-11-10: 10 ug/min via INTRAVENOUS

## 2018-11-10 MED ORDER — HEPARIN SODIUM (PORCINE) 5000 UNIT/ML IJ SOLN
5000.0000 [IU] | Freq: Three times a day (TID) | INTRAMUSCULAR | Status: DC
  Administered 2018-11-11 – 2018-11-12 (×4): 5000 [IU] via SUBCUTANEOUS
  Filled 2018-11-10 (×4): qty 1

## 2018-11-10 MED ORDER — MIDAZOLAM HCL 2 MG/2ML IJ SOLN
INTRAMUSCULAR | Status: AC
  Filled 2018-11-10: qty 2

## 2018-11-10 MED ORDER — SODIUM CHLORIDE 0.9 % IV SOLN
INTRAVENOUS | Status: AC
  Administered 2018-11-10: 14:00:00 via INTRAVENOUS

## 2018-11-10 MED ORDER — NITROGLYCERIN 1 MG/10 ML FOR IR/CATH LAB
INTRA_ARTERIAL | Status: DC | PRN
  Administered 2018-11-10 (×3): 200 ug via INTRACORONARY

## 2018-11-10 MED ORDER — ONDANSETRON HCL 4 MG/2ML IJ SOLN
INTRAMUSCULAR | Status: AC
  Filled 2018-11-10: qty 2

## 2018-11-10 MED ORDER — FENTANYL CITRATE (PF) 100 MCG/2ML IJ SOLN
INTRAMUSCULAR | Status: DC | PRN
  Administered 2018-11-10: 25 ug via INTRAVENOUS
  Administered 2018-11-10: 50 ug via INTRAVENOUS

## 2018-11-10 MED ORDER — ADENOSINE 6 MG/2ML IV SOLN
INTRAVENOUS | Status: AC
  Filled 2018-11-10: qty 2

## 2018-11-10 SURGICAL SUPPLY — 21 items
BALLN MINITREK RX 1.5X12 (BALLOONS) ×3
BALLN MINITREK RX 2.0X20 (BALLOONS) ×3
BALLN TREK RX 2.5X8 (BALLOONS) ×3
BALLN ~~LOC~~ TREK RX 3.5X8 (BALLOONS) ×3
BALLOON MINITREK RX 1.5X12 (BALLOONS) IMPLANT
BALLOON MINITREK RX 2.0X20 (BALLOONS) IMPLANT
BALLOON TREK RX 2.5X8 (BALLOONS) IMPLANT
BALLOON ~~LOC~~ TREK RX 3.5X8 (BALLOONS) IMPLANT
CATH 5F 110X4 TIG (CATHETERS) ×2 IMPLANT
CATH EXTRAC PRONTO 5.5F 138CM (CATHETERS) ×2 IMPLANT
CATH VISTA GUIDE 6FR XBLAD3.5 (CATHETERS) ×2 IMPLANT
DEVICE INFLAT 30 PLUS (MISCELLANEOUS) ×2 IMPLANT
DEVICE RAD TR BAND REGULAR (VASCULAR PRODUCTS) ×2 IMPLANT
GLIDESHEATH SLEND SS 6F .021 (SHEATH) ×2 IMPLANT
KIT MANI 3VAL PERCEP (MISCELLANEOUS) ×3 IMPLANT
PACK CARDIAC CATH (CUSTOM PROCEDURE TRAY) ×3 IMPLANT
STENT RESOLUTE ONYX 2.0X18 (Permanent Stent) ×2 IMPLANT
STENT RESOLUTE ONYX 3.0X12 (Permanent Stent) ×2 IMPLANT
WIRE HI TORQ WHISPER MS 190CM (WIRE) ×2 IMPLANT
WIRE ROSEN-J .035X260CM (WIRE) ×2 IMPLANT
WIRE RUNTHROUGH .014X180CM (WIRE) ×4 IMPLANT

## 2018-11-10 NOTE — Telephone Encounter (Signed)
Per pts chart review tab pt was admitted to [facility] on 11/09/18.

## 2018-11-10 NOTE — Interval H&P Note (Signed)
History and Physical Interval Note:  11/10/2018 10:52 AM  Martin Garza  has presented today for cardiac catheterization, with the diagnosis of NSTEMI.  The various methods of treatment have been discussed with the patient and family. After consideration of risks, benefits and other options for treatment, the patient has consented to  Procedure(s): LEFT HEART CATH AND CORONARY ANGIOGRAPHY (N/A) as a surgical intervention.  The patient's history has been reviewed, patient examined, no change in status, stable for surgery.  I have reviewed the patient's chart and labs.  Questions were answered to the patient's satisfaction.    Cath Lab Visit (complete for each Cath Lab visit)  Clinical Evaluation Leading to the Procedure:   ACS: Yes.    Non-ACS:  N/A  Anuja Manka

## 2018-11-10 NOTE — ED Notes (Signed)
Resting quietly at this time, awaiting room assignment.

## 2018-11-10 NOTE — Progress Notes (Signed)
*  PRELIMINARY RESULTS* Echocardiogram 2D Echocardiogram has been performed.  Martin Garza 11/10/2018, 2:58 PM

## 2018-11-10 NOTE — ED Notes (Signed)
Patient resting quietly with eyes closed at this time. No acute distress noted.

## 2018-11-10 NOTE — Consult Note (Signed)
Tirofiban started at 12:28. Tirofiban to run for 18 hours. Tirofiban order placed on MAR.   Thanks,   Eleonore Chiquito, PharmD, BCPS

## 2018-11-10 NOTE — Consult Note (Signed)
Tirofiban started at 12:28. Tirofiban to run for 18 hours. Tirofiban order placed on MAR.    6/22 CBC @ 1957 WNL (plt 203K)  Thanks,  Kristeen Miss, PharmD

## 2018-11-10 NOTE — ED Notes (Signed)
Resting quietly, no acute distress noted.

## 2018-11-10 NOTE — Consult Note (Signed)
ANTICOAGULATION CONSULT NOTE - Initial Consult  Pharmacy Consult for Heparin Indication: chest pain/ACS/STEMI  No Known Allergies  Patient Measurements: Height: 5\' 10"  (177.8 cm) Weight: 192 lb 14.4 oz (87.5 kg) IBW/kg (Calculated) : 73 Heparin Dosing Weight: 87.5 kg  Vital Signs: Temp: 97.5 F (36.4 C) (06/22 0804) Temp Source: Oral (06/22 0804) BP: 130/84 (06/22 0804) Pulse Rate: 57 (06/22 0804)  Labs: Recent Labs    11/09/18 1627 11/09/18 1752 11/09/18 2049 11/10/18 0003 11/10/18 0256 11/10/18 0939  HGB 14.7  --   --   --   --   --   HCT 41.8  --   --   --   --   --   PLT 206  --   --   --   --   --   APTT  --  30  --   --   --   --   LABPROT  --  12.7  --   --   --   --   INR  --  1.0  --   --   --   --   HEPARINUNFRC  --   --   --  0.56  --  0.46  CREATININE 1.23  --   --   --   --   --   TROPONINI 0.08*  --  0.32*  --  0.56*  --     Estimated Creatinine Clearance: 58.5 mL/min (by C-G formula based on SCr of 1.23 mg/dL).   Medical History: Past Medical History:  Diagnosis Date  . Allergy   . Cataract    had surg in both eyes  . Complication of anesthesia    Blood pressure goes up after waking up past sedation/ gets dizzy too per pt!  . Medical history non-contributory   . Retinal detachment 2014   left  . Umbilical hernia     Medications:  No PTA anticoagulation of record  Assessment: 68 yo male with unrelenting chest pain even at rest. Started on heparin 4000 units bolus x 1, followed by 1150 units/hou.   6/22 @0003  HL 0.56  6/22 @0939  HL 0.46    Goal of Therapy:  Heparin level 0.3-0.7 units/ml Monitor platelets by anticoagulation protocol: Yes   Plan:  Heparin level is therapeutic. Will continue with current rate and will recheck HL with AM labs. Daily CBC while on heparin.   Oswald Hillock, PharmD, BCPS Clinical Pharmacist 11/10/2018 10:03 AM

## 2018-11-10 NOTE — Brief Op Note (Signed)
BRIEF CARDIAC CATHETERIZATION NOTE  11/10/2018  2:25 PM  PATIENT:  Martin Garza  68 y.o. male  PRE-OPERATIVE DIAGNOSIS:  non ST segment myocardial infarction  POST-OPERATIVE DIAGNOSIS:  Same  PROCEDURE:  Procedure(s): LEFT HEART CATH AND CORONARY ANGIOGRAPHY (N/A) CORONARY STENT INTERVENTION (N/A)  SURGEON:  Surgeon(s) and Role:    * Harlie Buening, Harrell Gave, MD - Primary  FINDINGS: 1. Severe single-vessel CAD with sequential 95% proximal irregular 40-60% mid, and focal 80-90% distal LAD stenoses. 2. Mildly elevated LVEDP. 3. Successful PCI to proximal LAD using Resolute Onyx 3.0 x 12 mm DES (post-dilated to 3.6 mm) complicated by occlusion of the distal LAD (most likely due to thrombus/plaque embolization).  PCI was performed in the distal LAD using Resolute Onyx 2.0 x 18 mm DES but flow could never be restored to the apex.  RECOMMENDATIONS: 1. Transfer to ICU for medical therapy and monitoring. 2. Titrate NTG for relief of chest pain.  IV morphine can be added if CP remains uncontrolled. 3. Continue tirofiban for 18 hours. 4. DAPT with ASA and prasugrel for at least 12 months. 5. Aggressive secondary prevention. 6. Obtain echo.  Nelva Bush, MD [facility] Pager: [phone]

## 2018-11-10 NOTE — Consult Note (Signed)
Cardiology Consultation:   Patient ID: Martin Garza; [id number]; [birth year]   Admit date: 11/09/2018 Date of Consult: 11/10/2018  Primary Care Provider: Ria Bush, MD Primary Cardiologist: New to [facility] - consult by End   Patient Profile:   Martin Garza is a 68 y.o. male with a hx of retinal detachment who is being seen today for the evaluation of NSTEMI at the request of Dr. Leslye Peer.  History of Present Illness:   Martin Garza has no previously known cardiac history. He was in his usual state of health until 11/05/2018 when he began to develop exertional chest tightness while doing some yard work such a shoveling pine straw. With resting, his symptoms would resolve. He initially thought this was related to a possible hiatal hernia or reflux. Over the weekend, particularly on 6/21, he developed chest tightness at rest prompting him to contact his PCP's office which recommendation to proceed to the ED. Upon getting up to go to the ED, his chest tightness worsened and persisted until arriving at the ED with resolution of pain shortly thereafter. Pain has been associated with some SOB and is described as pressure-like that radiates across his chest.   Upon the patient's arrival to [facility] they were found to have stable BP. EKG showed sinus rhythm with nonspecific changes as below, CXR showed no active disease. Labs showed 0.08-->0.32-->0.56, CBC unremarkable, K+ 3.8, SC1.23, COVID-19 negative. In the ED he was given ASA and started on heparin. He has remained chest pain free since arrival to the ED. He reports a brother that underwent open heart surgery in his 39s and a daughter that has had a PPM since age 26. He denies any history of DM, HTN, HLD, tobacco, or illegal drug use. Rare EtOH use. Currently, chest pain free.   Past Medical History:  Diagnosis Date  . Allergy   . Cataract    had surg in both eyes  . Complication of anesthesia    Blood pressure goes up after waking up  past sedation/ gets dizzy too per pt!  . Medical history non-contributory   . Retinal detachment 2014   left  . Umbilical hernia     Past Surgical History:  Procedure Laterality Date  . CATARACT EXTRACTION  2018   Bil  . COLONOSCOPY  04/2018   TA, SSP, diverticulosis, rpt 3 yrs ([facility]); with polypectom y  . Left inguinal repair  in High School  . RETINAL DETACHMENT SURGERY Left    x 2 in 2014  . UMBILICAL HERNIA REPAIR N/A 05/30/2018   Procedure: LAPAROSCOPIC ASSISTED  UMBILICAL HERNIA REPAIR WITH MESH,  ERAS PATHWAY;  Surgeon: Johnathan Hausen, MD;  Location: [facility] ORS;  Service: General;  Laterality: N/A;     Home Meds: Prior to Admission medications   Medication Sig Start Date End Date Taking? Authorizing Provider  Cyanocobalamin (B-12 PO) Take 1,000 mcg by mouth daily.   Yes [provider]  vitamin C (ASCORBIC ACID) 500 MG tablet Take 1,000 mg by mouth daily.   Yes [provider]  acetaminophen (TYLENOL) 325 MG tablet Take 650 mg by mouth every 6 (six) hours as needed for moderate pain.     [provider]  HYDROcodone-acetaminophen (NORCO/VICODIN) 5-325 MG tablet Take 1 tablet by mouth every 6 (six) hours as needed for moderate pain. Patient not taking: Reported on 11/09/2018 05/30/18   Johnathan Hausen, MD  ibuprofen (ADVIL,MOTRIN) 200 MG tablet Take 400 mg by mouth every 8 (eight) hours as  needed for headache or moderate pain.     [provider]    Inpatient Medications: Scheduled Meds: . aspirin EC  81 mg Oral Daily  . atorvastatin  80 mg Oral q1800  . metoprolol tartrate  12.5 mg Oral BID  . vitamin B-12  1,000 mcg Oral Daily  . vitamin C  1,000 mg Oral Daily   Continuous Infusions: . sodium chloride    . famotidine (PEPCID) IV Stopped (11/09/18 2005)  . heparin 1,150 Units/hr (11/09/18 1912)   PRN Meds: acetaminophen, HYDROcodone-acetaminophen, nitroGLYCERIN, ondansetron (ZOFRAN) IV  Allergies:  No Known Allergies  Social  History:   Social History   Socioeconomic History  . Marital status: Married    Spouse name: Not on file  . Number of children: 1  . Years of education: Not on file  . Highest education level: Not on file  Occupational History  . 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 Financial resource strain: Not on file  . Food insecurity    Worry: Not on file    Inability: Not on file  . Transportation needs    Medical: Not on file    Non-medical: Not on file  Tobacco Use  . Smoking status: Never Smoker  . Smokeless tobacco: Former Systems developer    Types: Chew  Substance and Sexual Activity  . Alcohol use: Yes    Alcohol/week: 1.0 standard drinks    Types: 1 Glasses of wine per week    Comment: rare  . Drug use: No  . Sexual activity: Yes  Lifestyle  . Physical activity    Days per week: Not on file    Minutes per session: Not on file  . Stress: Not on file  Relationships  . Social Herbalist on phone: Not on file    Gets together: Not on file    Attends religious service: Not on file    Active member of club or organization: Not on file    Attends meetings of clubs or organizations: Not on file    Relationship status: Not on file  . Intimate partner violence    Fear of current or ex partner: Not on file    Emotionally abused: Not on file    Physically abused: Not on file    Forced sexual activity: Not on file  Other Topics Concern  . Not on file  Social History Narrative   Caffeine: 3 cups coffee/day, some unsweet tea   Lives with wife, 1 dog.  one grown daughter   Occupation: Adult nurse   Activity: no regular exercise.  yardwork.   Diet: some water, fruits/vegetables daily, avoids fried foods     Family History:   Family History  Problem Relation Age of Onset  . Cancer Mother 50       metastatic cancer, smoker  . Coronary artery disease Father 62       MI with CABG, smoker  . Stroke Father         after catheterization  . Alcohol abuse Father   . Alcohol abuse Brother   . Coronary artery disease Brother 41       CABG, smoker  . Depression Brother   . Drug abuse Brother   . Hearing loss Daughter   . Cancer Maternal Aunt        breast  . Breast cancer Maternal Aunt   . Diabetes Neg Hx   .  Colon cancer Neg Hx   . Rectal cancer Neg Hx   . Stomach cancer Neg Hx     ROS:  Review of Systems  Constitutional: Positive for malaise/fatigue. Negative for chills, diaphoresis, fever and weight loss.  HENT: Negative for congestion.   Eyes: Negative for discharge and redness.  Respiratory: Positive for shortness of breath. Negative for cough, hemoptysis, sputum production and wheezing.   Cardiovascular: Positive for chest pain. Negative for palpitations, orthopnea, claudication, leg swelling and PND.  Gastrointestinal: Positive for heartburn and nausea. Negative for abdominal pain, blood in stool, melena and vomiting.  Genitourinary: Negative for hematuria.  Musculoskeletal: Negative for falls and myalgias.  Skin: Negative for rash.  Neurological: Positive for weakness. Negative for dizziness, tingling, tremors, sensory change, speech change, focal weakness and loss of consciousness.  Endo/Heme/Allergies: Does not bruise/bleed easily.  Psychiatric/Behavioral: Negative for substance abuse. The patient is not nervous/anxious.   All other systems reviewed and are negative.     Physical Exam/Data:   Vitals:   11/09/18 2325 11/10/18 0200 11/10/18 0400 11/10/18 0600  BP:  (!) 136/95 121/76 (!) 121/58  Pulse: (!) 52 (!) 57 (!) 51 (!) 54  Resp: 17 15 15 14   Temp:      TempSrc:      SpO2: 95% 98% 94% 96%  Weight:      Height:       No intake or output data in the 24 hours ending 11/10/18 0737 Filed Weights   11/09/18 1623  Weight: 87.5 kg   Body mass index is 27.68 kg/m.   Physical Exam: General: Well developed, well nourished, in no acute distress. Head: Normocephalic,  atraumatic, sclera non-icteric, no xanthomas, nares without discharge.  Neck: Negative for carotid bruits. JVD not elevated. Lungs: Clear bilaterally to auscultation without wheezes, rales, or rhonchi. Breathing is unlabored. Heart: RRR with S1 S2. No murmurs, rubs, or gallops appreciated. Abdomen: Soft, non-tender, non-distended with normoactive bowel sounds. No hepatomegaly. No rebound/guarding. No obvious abdominal masses. Msk:  Strength and tone appear normal for age. Extremities: No clubbing or cyanosis. No edema. Distal pedal pulses are 2+ and equal bilaterally. Neuro: Alert and oriented X 3. No facial asymmetry. No focal deficit. Moves all extremities spontaneously. Psych:  Responds to questions appropriately with a normal affect.   EKG:  The EKG was personally reviewed and demonstrates: NSR, 75 bpm, poor R wave progression along the precordial leads, possible anterior infarct, nonspecific inferior st/t changes  Telemetry:  Telemetry was personally reviewed and demonstrates: sinus rhythm to sinus bradycardia, 50s to 60s bpm  Weights: Filed Weights   11/09/18 1623  Weight: 87.5 kg    Relevant CV Studies: none  Laboratory Data:  Chemistry Recent Labs  Lab 11/09/18 1627  NA 142  K 3.8  CL 109  CO2 24  GLUCOSE 122*  BUN 23  CREATININE 1.23  CALCIUM 9.4  GFRNONAA 60*  GFRAA >60  ANIONGAP 9    No results for input(s): PROT, ALBUMIN, AST, ALT, ALKPHOS, BILITOT in the last 168 hours. Hematology Recent Labs  Lab 11/09/18 1627  WBC 7.0  RBC 4.48  HGB 14.7  HCT 41.8  MCV 93.3  MCH 32.8  MCHC 35.2  RDW 12.2  PLT 206   Cardiac Enzymes Recent Labs  Lab 11/09/18 1627 11/09/18 2049 11/10/18 0256  TROPONINI 0.08* 0.32* 0.56*   No results for input(s): TROPIPOC in the last 168 hours.  BNPNo results for input(s): BNP, PROBNP in the last 168 hours.  DDimer No  results for input(s): DDIMER in the last 168 hours.  Radiology/Studies:  Dg Chest 2 View  Result Date:  11/09/2018 IMPRESSION: No active cardiopulmonary disease. Electronically Signed   By: Margarette Canada M.D.   On: 11/09/2018 17:38    Assessment and Plan:   1. NSTEMI: -Currently, chest pain free -Troponin has trended to 0.56 currently -Heparin gtt -ASA -Schedule LHC this morning -Echo -Metoprolol, may need to change to Coreg following cath given mild bradycardia  -Lipitor as below -Risks and benefits of cardiac catheterization have been discussed with the patient including risks of bleeding, bruising, infection, kidney damage, stroke, heart attack, and death. The patient understands these risks and is willing to proceed with the procedure. All questions have been answered and concerns listened to  2. HLD: -LDL of 166 upon admission -Lipitor 80 mg daily -Recheck fasting lipid panel and LFT in ~ 8 weeks  3. Hyperglycemia: -Check A1c   For questions or updates, please contact [place] Please consult [email or web address] for contact info under Cardiology/STEMI.   Signed, Christell Faith, PA-C [place] Pager: [phone] 11/10/2018, 7:37 AM

## 2018-11-10 NOTE — ED Notes (Signed)
Patient resting quietly at this time.  Labs drawn.  Voices no complaints at this time.

## 2018-11-10 NOTE — H&P (View-Only) (Signed)
Cardiology Consultation:   Patient ID: [name]; [id number]; [birth year]   Admit date: 11/09/2018 Date of Consult: 11/10/2018  Primary Care Provider: Ria Bush, MD Primary Cardiologist: New to [facility] ([facility]) - consult by End   Patient Profile:   Martin Garza is a 68 y.o. male with a hx of retinal detachment who is being seen today for the evaluation of NSTEMI at the request of Dr. Leslye Peer.  History of Present Illness:   [name] has no previously known cardiac history. He was in his usual state of health until 11/05/2018 when he began to develop exertional chest tightness while doing some yard work such a shoveling pine straw. With resting, his symptoms would resolve. He initially thought this was related to a possible hiatal hernia or reflux. Over the weekend, particularly on 6/21, he developed chest tightness at rest prompting him to contact his PCP's office which recommendation to proceed to the ED. Upon getting up to go to the ED, his chest tightness worsened and persisted until arriving at the ED with resolution of pain shortly thereafter. Pain has been associated with some SOB and is described as pressure-like that radiates across his chest.   Upon the patient's arrival to [facility] they were found to have stable BP. EKG showed sinus rhythm with nonspecific changes as below, CXR showed no active disease. Labs showed 0.08-->0.32-->0.56, CBC unremarkable, K+ 3.8, SC1.23, COVID-19 negative. In the ED he was given ASA and started on heparin. He has remained chest pain free since arrival to the ED. He reports a brother that underwent open heart surgery in his 68s and a daughter that has had a PPM since age 48. He denies any history of DM, HTN, HLD, tobacco, or illegal drug use. Rare EtOH use. Currently, chest pain free.   Past Medical History:  Diagnosis Date  . Allergy   . Cataract    had surg in both eyes  . Complication of anesthesia    Blood pressure goes up after waking up  past sedation/ gets dizzy too per pt!  . Medical history non-contributory   . Retinal detachment 2014   left  . Umbilical hernia     Past Surgical History:  Procedure Laterality Date  . CATARACT EXTRACTION  2018   Bil  . COLONOSCOPY  04/2018   TA, SSP, diverticulosis, rpt 3 yrs ([facility]); with polypectom y  . Left inguinal repair  in High School  . RETINAL DETACHMENT SURGERY Left    x 2 in 2014  . UMBILICAL HERNIA REPAIR N/A 05/30/2018   Procedure: LAPAROSCOPIC ASSISTED  UMBILICAL HERNIA REPAIR WITH MESH,  ERAS PATHWAY;  Surgeon: Johnathan Hausen, MD;  Location: [facility] ORS;  Service: General;  Laterality: N/A;     Home Meds: Prior to Admission medications   Medication Sig Start Date End Date Taking? Authorizing Provider  Cyanocobalamin (B-12 PO) Take 1,000 mcg by mouth daily.   Yes [provider]  vitamin C (ASCORBIC ACID) 500 MG tablet Take 1,000 mg by mouth daily.   Yes [provider]  acetaminophen (TYLENOL) 325 MG tablet Take 650 mg by mouth every 6 (six) hours as needed for moderate pain.     [provider]  HYDROcodone-acetaminophen (NORCO/VICODIN) 5-325 MG tablet Take 1 tablet by mouth every 6 (six) hours as needed for moderate pain. Patient not taking: Reported on 11/09/2018 05/30/18   Johnathan Hausen, MD  ibuprofen (ADVIL,MOTRIN) 200 MG tablet Take 400 mg by mouth every 8 (eight) hours as  needed for headache or moderate pain.     [provider]    Inpatient Medications: Scheduled Meds: . aspirin EC  81 mg Oral Daily  . atorvastatin  80 mg Oral q1800  . metoprolol tartrate  12.5 mg Oral BID  . vitamin B-12  1,000 mcg Oral Daily  . vitamin C  1,000 mg Oral Daily   Continuous Infusions: . sodium chloride    . famotidine (PEPCID) IV Stopped (11/09/18 2005)  . heparin 1,150 Units/hr (11/09/18 1912)   PRN Meds: acetaminophen, HYDROcodone-acetaminophen, nitroGLYCERIN, ondansetron (ZOFRAN) IV  Allergies:  No Known Allergies  Social  History:   Social History   Socioeconomic History  . Marital status: Married    Spouse name: Not on file  . Number of children: 1  . Years of education: Not on file  . Highest education level: Not on file  Occupational History  . 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 Financial resource strain: Not on file  . Food insecurity    Worry: Not on file    Inability: Not on file  . Transportation needs    Medical: Not on file    Non-medical: Not on file  Tobacco Use  . Smoking status: Never Smoker  . Smokeless tobacco: Former Systems developer    Types: Chew  Substance and Sexual Activity  . Alcohol use: Yes    Alcohol/week: 1.0 standard drinks    Types: 1 Glasses of wine per week    Comment: rare  . Drug use: No  . Sexual activity: Yes  Lifestyle  . Physical activity    Days per week: Not on file    Minutes per session: Not on file  . Stress: Not on file  Relationships  . Social Herbalist on phone: Not on file    Gets together: Not on file    Attends religious service: Not on file    Active member of club or organization: Not on file    Attends meetings of clubs or organizations: Not on file    Relationship status: Not on file  . Intimate partner violence    Fear of current or ex partner: Not on file    Emotionally abused: Not on file    Physically abused: Not on file    Forced sexual activity: Not on file  Other Topics Concern  . Not on file  Social History Narrative   Caffeine: 3 cups coffee/day, some unsweet tea   Lives with wife, 1 dog.  one grown daughter   Occupation: Adult nurse   Activity: no regular exercise.  yardwork.   Diet: some water, fruits/vegetables daily, avoids fried foods     Family History:   Family History  Problem Relation Age of Onset  . Cancer Mother 58       metastatic cancer, smoker  . Coronary artery disease Father 55       MI with CABG, smoker  . Stroke Father         after catheterization  . Alcohol abuse Father   . Alcohol abuse Brother   . Coronary artery disease Brother 80       CABG, smoker  . Depression Brother   . Drug abuse Brother   . Hearing loss Daughter   . Cancer Maternal Aunt        breast  . Breast cancer Maternal Aunt   . Diabetes Neg Hx   .  Colon cancer Neg Hx   . Rectal cancer Neg Hx   . Stomach cancer Neg Hx     ROS:  Review of Systems  Constitutional: Positive for malaise/fatigue. Negative for chills, diaphoresis, fever and weight loss.  HENT: Negative for congestion.   Eyes: Negative for discharge and redness.  Respiratory: Positive for shortness of breath. Negative for cough, hemoptysis, sputum production and wheezing.   Cardiovascular: Positive for chest pain. Negative for palpitations, orthopnea, claudication, leg swelling and PND.  Gastrointestinal: Positive for heartburn and nausea. Negative for abdominal pain, blood in stool, melena and vomiting.  Genitourinary: Negative for hematuria.  Musculoskeletal: Negative for falls and myalgias.  Skin: Negative for rash.  Neurological: Positive for weakness. Negative for dizziness, tingling, tremors, sensory change, speech change, focal weakness and loss of consciousness.  Endo/Heme/Allergies: Does not bruise/bleed easily.  Psychiatric/Behavioral: Negative for substance abuse. The patient is not nervous/anxious.   All other systems reviewed and are negative.     Physical Exam/Data:   Vitals:   11/09/18 2325 11/10/18 0200 11/10/18 0400 11/10/18 0600  BP:  (!) 136/95 121/76 (!) 121/58  Pulse: (!) 52 (!) 57 (!) 51 (!) 54  Resp: 17 15 15 14   Temp:      TempSrc:      SpO2: 95% 98% 94% 96%  Weight:      Height:       No intake or output data in the 24 hours ending 11/10/18 0737 Filed Weights   11/09/18 1623  Weight: 87.5 kg   Body mass index is 27.68 kg/m.   Physical Exam: General: Well developed, well nourished, in no acute distress. Head: Normocephalic,  atraumatic, sclera non-icteric, no xanthomas, nares without discharge.  Neck: Negative for carotid bruits. JVD not elevated. Lungs: Clear bilaterally to auscultation without wheezes, rales, or rhonchi. Breathing is unlabored. Heart: RRR with S1 S2. No murmurs, rubs, or gallops appreciated. Abdomen: Soft, non-tender, non-distended with normoactive bowel sounds. No hepatomegaly. No rebound/guarding. No obvious abdominal masses. Msk:  Strength and tone appear normal for age. Extremities: No clubbing or cyanosis. No edema. Distal pedal pulses are 2+ and equal bilaterally. Neuro: Alert and oriented X 3. No facial asymmetry. No focal deficit. Moves all extremities spontaneously. Psych:  Responds to questions appropriately with a normal affect.   EKG:  The EKG was personally reviewed and demonstrates: NSR, 75 bpm, poor R wave progression along the precordial leads, possible anterior infarct, nonspecific inferior st/t changes  Telemetry:  Telemetry was personally reviewed and demonstrates: sinus rhythm to sinus bradycardia, 50s to 60s bpm  Weights: Filed Weights   11/09/18 1623  Weight: 87.5 kg    Relevant CV Studies: none  Laboratory Data:  Chemistry Recent Labs  Lab 11/09/18 1627  NA 142  K 3.8  CL 109  CO2 24  GLUCOSE 122*  BUN 23  CREATININE 1.23  CALCIUM 9.4  GFRNONAA 60*  GFRAA >60  ANIONGAP 9    No results for input(s): PROT, ALBUMIN, AST, ALT, ALKPHOS, BILITOT in the last 168 hours. Hematology Recent Labs  Lab 11/09/18 1627  WBC 7.0  RBC 4.48  HGB 14.7  HCT 41.8  MCV 93.3  MCH 32.8  MCHC 35.2  RDW 12.2  PLT 206   Cardiac Enzymes Recent Labs  Lab 11/09/18 1627 11/09/18 2049 11/10/18 0256  TROPONINI 0.08* 0.32* 0.56*   No results for input(s): TROPIPOC in the last 168 hours.  BNPNo results for input(s): BNP, PROBNP in the last 168 hours.  DDimer No  results for input(s): DDIMER in the last 168 hours.  Radiology/Studies:  Dg Chest 2 View  Result Date:  11/09/2018 IMPRESSION: No active cardiopulmonary disease. Electronically Signed   By: Margarette Canada M.D.   On: 11/09/2018 17:38    Assessment and Plan:   1. NSTEMI: -Currently, chest pain free -Troponin has trended to 0.56 currently -Heparin gtt -ASA -Schedule LHC this morning -Echo -Metoprolol, may need to change to Coreg following cath given mild bradycardia  -Lipitor as below -Risks and benefits of cardiac catheterization have been discussed with the patient including risks of bleeding, bruising, infection, kidney damage, stroke, heart attack, and death. The patient understands these risks and is willing to proceed with the procedure. All questions have been answered and concerns listened to  2. HLD: -LDL of 166 upon admission -Lipitor 80 mg daily -Recheck fasting lipid panel and LFT in ~ 8 weeks  3. Hyperglycemia: -Check A1c   For questions or updates, please contact [place] Please consult [email or web address] for contact info under Cardiology/STEMI.   Signed, Christell Faith, PA-C [place] Pager: [phone] 11/10/2018, 7:37 AM

## 2018-11-10 NOTE — Telephone Encounter (Signed)
Note.d thanks. Admitted for ACS.

## 2018-11-10 NOTE — Telephone Encounter (Signed)
[place] Night - Client TELEPHONE ADVICE RECORD [facility] Patient Name: Martin Garza Gender: Male DOB: 1950/06/19 Age: 68 Y 10 M 5 D Return Phone Number: [phone] (Primary), [phone] (Secondary) [place] [address] Client [place] Night - Client Client Site [place] Physician Ria Bush - MD Contact Type Call Who Is Calling Patient / Member / Family / Caregiver Call Type Triage / Clinical Caller Name Rayson Rando Relationship To Patient Spouse Return Phone Number [phone] (Primary) Chief Complaint CHEST PAIN (>=21 years) - pain, pressure, heaviness or tightness Reason for Call Symptomatic / Request for [place] states her husbands chest hurts and his arms feel tight and burning. He feels like he has indigestion. He has a headache Translation No Nurse Assessment Nurse: Gareth Eagle, RN, Raquel Sarna Date/Time Eilene Ghazi Time): 11/09/2018 3:40:29 PM Confirm and document reason for call. If symptomatic, describe symptoms. ---Caller states husband has been having what he thought was indigestion over the weekend. Has taken Alka-Seltzer chews with no relief. Having intermittent central chest discomfort that worsens with activity, feels tight in the chest. Has burning sensation in the chest. Symptoms alleviated after burping but return as soon as he gets up to do anything. Also with mild headache; SOB with exertion. BP 160/85 during episodes, HR 80's (baseline 60's). Has the patient had close contact with a person known or suspected to have the novel coronavirus illness OR traveled / lives in area with major community spread (including international travel) in the last 14 days from the onset of symptoms? * If Asymptomatic, screen for exposure and travel within the last 14 days. ---No Does the patient have any new or worsening  symptoms? ---Yes Will a triage be completed? ---Yes Related visit to physician within the last 2 weeks? ---No Does the PT have any chronic conditions? (i.e. diabetes, asthma, this includes High risk factors for pregnancy, etc.) ---No Is this a behavioral health or substance abuse call? ---No PLEASE NOTE: All timestamps contained within this report are represented as [place] Standard Time. CONFIDENTIALTY NOTICE: This fax transmission is intended only for the addressee. It contains information that is legally privileged, confidential or otherwise protected from use or disclosure. If you are not the intended recipient, you are strictly prohibited from reviewing, disclosing, copying using or disseminating any of this information or taking any action in reliance on or regarding this information. If you have received this fax in error, please notify us immediately by telephone so that we can arrange for its return to [place]. Phone: [phone], Toll-Free: [phone], Fax: [phone] Page: 2 of 2 Call Id: [id number] Guidelines Guideline Title Affirmed Question Affirmed Notes Nurse Date/Time Eilene Ghazi Time) Chest Pain [1] Chest pain lasts > 5 minutes AND [2] age > 30 AND [3] one or more cardiac risk factors (e.g., diabetes, high blood pressure, high cholesterol, smoker, or strong family history of heart disease) Theodoro Grist 11/09/2018 3:44:59 PM Disp. Time Eilene Ghazi Time) Disposition Final User 11/09/2018 3:38:18 PM Send to Urgent Queue Knox Royalty 11/09/2018 3:52:23 PM 911 Outcome Documentation Gareth Eagle, RN, Raquel Sarna Reason: Wants to go to ED by car rather than ambulance 11/09/2018 3:51:15 PM Call EMS 911 Now Yes Gareth Eagle, RN, Judeth Horn Disagree/Comply Disagree Caller Understands Yes PreDisposition InappropriateToAsk Care Advice Given Per Guideline CALL EMS 911 NOW: CARE ADVICE given per Chest Pain (Adult) guideline. Referrals [facility] - ED

## 2018-11-10 NOTE — Progress Notes (Signed)
Martin Garza at [place] NAME: Martin Garza    MR#:  [id number]  DATE OF BIRTH:  1950/09/30  SUBJECTIVE:   Patient came in with chest discomfort. He ruled in for non-QA of MI. Seen in specials recovery. Continues to have some chest pain. He is currently on IV nitroglycerin drip and IV aggrostat REVIEW OF SYSTEMS:   Review of Systems  Constitutional: Negative for chills, fever and weight loss.  HENT: Negative for ear discharge, ear pain and nosebleeds.   Eyes: Negative for blurred vision, pain and discharge.  Respiratory: Negative for sputum production, shortness of breath, wheezing and stridor.   Cardiovascular: Positive for chest pain. Negative for palpitations, orthopnea and PND.  Gastrointestinal: Negative for abdominal pain, diarrhea, nausea and vomiting.  Genitourinary: Negative for frequency and urgency.  Musculoskeletal: Negative for back pain and joint pain.  Neurological: Negative for sensory change, speech change, focal weakness and weakness.  Psychiatric/Behavioral: Negative for depression and hallucinations. The patient is not nervous/anxious.    Tolerating Diet:yes Tolerating PT:   DRUG ALLERGIES:  No Known Allergies  VITALS:  Blood pressure 112/67, pulse (!) 55, temperature 97.9 F (36.6 C), temperature source Oral, resp. rate 14, height 5\' 10"  (1.778 m), weight 87.5 kg, SpO2 92 %.  PHYSICAL EXAMINATION:   Physical Exam  GENERAL:  68 y.o.-year-old patient lying in the bed with no acute distress.  EYES: Pupils equal, round, reactive to light and accommodation. No scleral icterus. Extraocular muscles intact.  HEENT: Head atraumatic, normocephalic. Oropharynx and nasopharynx clear.  NECK:  Supple, no jugular venous distention. No thyroid enlargement, no tenderness.  LUNGS: Normal breath sounds bilaterally, no wheezing, rales, rhonchi. No use of accessory muscles of respiration.  CARDIOVASCULAR: S1, S2 normal. No  murmurs, rubs, or gallops.  ABDOMEN: Soft, nontender, nondistended. Bowel sounds present. No organomegaly or mass.  EXTREMITIES: No cyanosis, clubbing or edema b/l.    NEUROLOGIC: Cranial nerves II through XII are intact. No focal Motor or sensory deficits b/l.   PSYCHIATRIC:  patient is alert and oriented x 3.  SKIN: No obvious rash, lesion, or ulcer.   LABORATORY PANEL:  CBC Recent Labs  Lab 11/09/18 1627  WBC 7.0  HGB 14.7  HCT 41.8  PLT 206    Chemistries  Recent Labs  Lab 11/09/18 1627  NA 142  K 3.8  CL 109  CO2 24  GLUCOSE 122*  BUN 23  CREATININE 1.23  CALCIUM 9.4   Cardiac Enzymes Recent Labs  Lab 11/10/18 0256  TROPONINI 0.56*   RADIOLOGY:  Dg Chest 2 View  Result Date: 11/09/2018 CLINICAL DATA:  Acute chest pain EXAM: CHEST - 2 VIEW COMPARISON:  None. FINDINGS: The cardiomediastinal silhouette is unremarkable. There is no evidence of focal airspace disease, pulmonary edema, suspicious pulmonary nodule/mass, pleural effusion, or pneumothorax. No acute bony abnormalities are identified. IMPRESSION: No active cardiopulmonary disease. Electronically Signed   By: Martin Garza M.D.   On: 11/09/2018 17:38   ASSESSMENT AND PLAN:  Martin Garza  is a 68 y.o. male presents with chest pain going on and off for the past 2 to 3 days.  He felt worse today.  Previously he has developed chest pain when he was doing something.  When he sits that eases off.  1.  Acute coronary syndrome/NSTEMI - Symptoms worsening over the past few days.   -Borderline elevated troponin.  -was  Started on aspirin, heparin drip beta-blocker and Lipitor -now on IV nitor and  IV Aggrastat drip post cath -s/p Cath by Dr END Severe single-vessel CAD with sequential 95% proximal irregular 40-60% mid, and focal 80-90% distal LAD stenoses. Successful PCI to proximal LAD using Resolute Onyx 3.0 x 12 mm DES (post-dilated to 3.6 mm) complicated by occlusion of the distal LAD (most likely due to  thrombus/plaque embolization).  PCI was performed in the distal LAD using Resolute Onyx 2.0 x 18 mm DES but flow could never be restored to the apex. -?ace inhibitor per cardiology rec -Echo results pending  2.  GERD we will put on Pepcid IV  3. Hyperlipidemia continue statins   CODE STATUS: full  DVT Prophylaxis: aggrastat for now  TOTAL TIME TAKING CARE OF THIS PATIENT: *30 minutes.  >50% time spent on counselling and coordination of care  POSSIBLE D/C IN *1-2* DAYS, DEPENDING ON CLINICAL CONDITION.  Note: This dictation was prepared with Dragon dictation along with smaller phrase technology. Any transcriptional errors that result from this process are unintentional.  Martin Garza M.D on 11/10/2018 at 3:56 PM  Between 7am to 6pm - Pager - [phone]  After 6pm go to [email or web address] - password [email or web address] [name]  Office  [phone]  CC: Primary care physician; Martin Garza, MDPatient ID: Martin Garza, male   DOB: [birth year], 68 y.o.   MRN: [id number]

## 2018-11-10 NOTE — Consult Note (Signed)
ANTICOAGULATION CONSULT NOTE - Initial Consult  Pharmacy Consult for Heparin Indication: chest pain/ACS/STEMI  No Known Allergies  Patient Measurements: Height: 5\' 10"  (177.8 cm) Weight: 192 lb 14.4 oz (87.5 kg) IBW/kg (Calculated) : 73 Heparin Dosing Weight: 87.5 kg  Vital Signs: Temp: 98.2 F (36.8 C) (06/21 1623) Temp Source: Oral (06/21 1623) BP: 111/82 (06/21 2230) Pulse Rate: 52 (06/21 2325)  Labs: Recent Labs    11/09/18 1627 11/09/18 1752 11/09/18 2049 11/10/18 0003  HGB 14.7  --   --   --   HCT 41.8  --   --   --   PLT 206  --   --   --   APTT  --  30  --   --   LABPROT  --  12.7  --   --   INR  --  1.0  --   --   HEPARINUNFRC  --   --   --  0.56  CREATININE 1.23  --   --   --   TROPONINI 0.08*  --  0.32*  --     Estimated Creatinine Clearance: 58.5 mL/min (by C-G formula based on SCr of 1.23 mg/dL).   Medical History: Past Medical History:  Diagnosis Date  . Allergy   . Cataract    had surg in both eyes  . Complication of anesthesia    Blood pressure goes up after waking up past sedation/ gets dizzy too per pt!  . Medical history non-contributory   . Retinal detachment 2014   left  . Umbilical hernia     Medications:  No PTA anticoagulation of record  Assessment: 68 yo male with unrelenting chest pain even at rest.  Goal of Therapy:  Heparin level 0.3-0.7 units/ml Monitor platelets by anticoagulation protocol: Yes   Plan:  06/22 @ 0000 HL 0.56 therapeutic. Will continue with current rate and will recheck HL @ 0800.  Tobie Lords, PharmD, BCPS Clinical Pharmacist 11/10/2018 12:46 AM

## 2018-11-11 ENCOUNTER — Encounter: Payer: Self-pay | Admitting: Internal Medicine

## 2018-11-11 DIAGNOSIS — I214 Non-ST elevation (NSTEMI) myocardial infarction: Secondary | ICD-10-CM

## 2018-11-11 LAB — BASIC METABOLIC PANEL
Anion gap: 11 (ref 5–15)
BUN: 17 mg/dL (ref 8–23)
CO2: 24 mmol/L (ref 22–32)
Calcium: 8.4 mg/dL — ABNORMAL LOW (ref 8.9–10.3)
Chloride: 104 mmol/L (ref 98–111)
Creatinine, Ser: 0.92 mg/dL (ref 0.61–1.24)
GFR calc Af Amer: >60 mL/min (ref >60)
GFR calc non Af Amer: >60 mL/min (ref >60)
Glucose, Bld: 116 mg/dL — ABNORMAL HIGH (ref 70–99)
Potassium: 3.9 mmol/L (ref 3.5–5.1)
Sodium: 139 mmol/L (ref 135–145)

## 2018-11-11 LAB — CBC
HCT: 36.6 % — ABNORMAL LOW (ref 39.0–52.0)
Hemoglobin: 12.4 g/dL — ABNORMAL LOW (ref 13.0–17.0)
MCH: 32.3 pg (ref 26.0–34.0)
MCHC: 33.9 g/dL (ref 30.0–36.0)
MCV: 95.3 fL (ref 80.0–100.0)
Platelets: 190 10*3/uL (ref 150–400)
RBC: 3.84 MIL/uL — ABNORMAL LOW (ref 4.22–5.81)
RDW: 12.5 % (ref 11.5–15.5)
WBC: 9.7 10*3/uL (ref 4.0–10.5)
nRBC: 0 % (ref 0.0–0.2)

## 2018-11-11 LAB — TROPONIN I
Troponin I: 4.12 ng/mL (ref <0.03)
Troponin I: 5.62 ng/mL (ref <0.03)

## 2018-11-11 LAB — HEMOGLOBIN A1C
Hgb A1c MFr Bld: 5.5 % (ref 4.8–5.6)
Mean Plasma Glucose: 111 mg/dL

## 2018-11-11 LAB — HIV ANTIBODY (ROUTINE TESTING W REFLEX): HIV Screen 4th Generation wRfx: NONREACTIVE

## 2018-11-11 MED ORDER — MORPHINE SULFATE (PF) 2 MG/ML IV SOLN: 1.0000 mg | Freq: Four times a day (QID) | INTRAVENOUS | Status: DC | PRN

## 2018-11-11 MED ORDER — ISOSORBIDE MONONITRATE ER 30 MG PO TB24
15.0000 mg | ORAL_TABLET | Freq: Every day | ORAL | Status: DC
  Administered 2018-11-11 – 2018-11-12 (×2): 15 mg via ORAL
  Filled 2018-11-11 (×2): qty 1

## 2018-11-11 NOTE — Progress Notes (Signed)
Called to give report to receiving nurse, nurse requests to call me back to receive report. Awaiting return call from receiving nurse.

## 2018-11-11 NOTE — Progress Notes (Signed)
Progress Note  Patient Name: Martin Garza Date of Encounter: 11/11/2018  Primary Cardiologist: [facility], Dr. Saunders Revel  Subjective   Reporting some residual central chest discomfort this morning and after 11/10/2018 cardiac catheterization.  No SOB, racing HR, palpitations, or other cardiac complaints at this time.  Inpatient Medications    Scheduled Meds: . aspirin EC  81 mg Oral Daily  . atorvastatin  80 mg Oral q1800  . famotidine  20 mg Oral Daily  . heparin  5,000 Units Subcutaneous Q8H  . metoprolol tartrate  12.5 mg Oral BID  . prasugrel  10 mg Oral Daily  . sodium chloride flush  3 mL Intravenous Q12H  . vitamin B-12  1,000 mcg Oral Daily  . vitamin C  1,000 mg Oral Daily   Continuous Infusions: . sodium chloride 30 mL/hr at 11/11/18 0500  . sodium chloride    . nitroGLYCERIN 25 mcg/min (11/11/18 0500)   PRN Meds: sodium chloride, acetaminophen, HYDROcodone-acetaminophen, morphine injection, nitroGLYCERIN, ondansetron (ZOFRAN) IV, sodium chloride flush   Vital Signs    Vitals:   11/11/18 0300 11/11/18 0400 11/11/18 0440 11/11/18 0500  BP: 104/66 112/73  116/71  Pulse: (!) 57 (!) 54 (!) 52 (!) 58  Resp: 14 14 15 16   Temp:  98.3 F (36.8 C)    TempSrc:  Oral    SpO2: 95% 94% 97% 99%  Weight:      Height:        Intake/Output Summary (Last 24 hours) at 11/11/2018 0748 Last data filed at 11/11/2018 0500 Gross per 24 hour  Intake 643.44 ml  Output 1710 ml  Net -1066.56 ml   Filed Weights   11/09/18 1623 11/10/18 1642  Weight: 87.5 kg 92.7 kg    Telemetry    Sinus bradycardia to Sinus rhythm at 48-75bpm with 4 beat NSVT at 4:26AM - Personally Reviewed  ECG    No new tracings - Personally Reviewed  Physical Exam   GEN: No acute distress.  Lying in bed. Neck: No JVD Cardiac: RRR, no murmurs, rubs, or gallops.  Respiratory: Clear to auscultation bilaterally. GI: Soft, nontender, non-distended  MS: No b/l lower extremity edema; No deformity. R  radial wrist cath site without signs of swelling or infection. Neuro:  Nonfocal  Psych: Normal affect   Labs    Chemistry Recent Labs  Lab 11/09/18 1627 11/11/18 0218  NA 142 139  K 3.8 3.9  CL 109 104  CO2 24 24  GLUCOSE 122* 116*  BUN 23 17  CREATININE 1.23 0.92  CALCIUM 9.4 8.4*  GFRNONAA 60* >60  GFRAA >60 >60  ANIONGAP 9 11     Hematology Recent Labs  Lab 11/09/18 1627 11/10/18 1946 11/11/18 0218  WBC 7.0 8.6 9.7  RBC 4.48 4.12* 3.84*  HGB 14.7 13.4 12.4*  HCT 41.8 39.4 36.6*  MCV 93.3 95.6 95.3  MCH 32.8 32.5 32.3  MCHC 35.2 34.0 33.9  RDW 12.2 12.6 12.5  PLT 206 203 190    Cardiac Enzymes Recent Labs  Lab 11/10/18 0256 11/10/18 1702 11/10/18 1946 11/11/18 0218  TROPONINI 0.56* 0.74* 1.30* 4.12*   No results for input(s): TROPIPOC in the last 168 hours.   BNPNo results for input(s): BNP, PROBNP in the last 168 hours.   DDimer No results for input(s): DDIMER in the last 168 hours.   Radiology    Dg Chest 2 View  Result Date: 11/09/2018 CLINICAL DATA:  Acute chest pain EXAM: CHEST - 2 VIEW COMPARISON:  None. FINDINGS: The cardiomediastinal silhouette is unremarkable. There is no evidence of focal airspace disease, pulmonary edema, suspicious pulmonary nodule/mass, pleural effusion, or pneumothorax. No acute bony abnormalities are identified. IMPRESSION: No active cardiopulmonary disease. Electronically Signed   By: Margarette Canada M.D.   On: 11/09/2018 17:38    Cardiac Studies   TTE 11/10/2018  1. Severe hypokinesis of the left ventricular, apical septal wall, apical segment and anterior segment.  2. The left ventricle has normal systolic function, with an ejection fraction of 55-60%. The cavity size was normal. There is moderately increased left ventricular wall thickness. Left ventricular diastolic Doppler parameters are consistent with  impaired relaxation.  3. The right ventricle has normal systolic function. The cavity was normal. There is no  increase in right ventricular wall thickness. Right ventricular systolic pressure could not be assessed.  4. The aortic valve is tricuspid.  5. The interatrial septum was not well visualized.  LHC 11/10/2018 Conclusions: 1. Severe single-vessel CAD with sequential 95% proximal irregular 40-60% mid, and focal 80-90% distal LAD stenoses. 2. Mildly elevated left ventricular filling pressure. 3. Successful PCI to proximal LAD using Resolute Onyx 3.0 x 12 mm drug-eluting stent (post-dilated to 3.6 mm) complicated by occlusion of the distal LAD (most likely due to thrombus/plaque embolization). PCI was performed in the distal LAD using Resolute Onyx 2.0 x 18 mm drug-eluting stent but flow could never be restored to the apex. Recommendations: 1. Transfer to ICU for medical therapy and monitoring. 2. Titrate nitroglycerin infusion for relief of chest pain. IV morphine can be added if chest pain remains uncontrolled. 3. Continue tirofiban for 18 hours. 4. Dual antiplatelet therapy with aspirin and prasugrel for at least 12 months. 5. Aggressive secondary prevention. 6. Obtain echo.   Patient Profile     68 y.o. male with a history of NSTEMI s/p LHC and PCI to the pLAD and dLAD and retinal detachment who is being seen today s/p catheterization on 6/22 for NSTEMI.  Assessment & Plan    NSTEMI, s/p LHC/PCI 11/10/2018 - Reports mild chest discomfort this morning. Otherwise, no cardiac complaints.  - Severe single vessel CAD.  S/p PCI to pLAD, complicated by occlusion of distal LAD (most likely d/t thrombus/plaque embolization). PCI then performed to dLAD as above but flow could not be restored to the apex. Tirofiban infusion administered for 18h and s/p cath. Follow-up echo 6/22 as above showed severe hypokinesis of the left ventricular, apical septal wall, apical segment and anterior segment. Normal EF. - Continue to cycle troponin until peaked and down-trending. Troponin 0.32  5.62. - Can likely  transfer to the telemetry floor at this time. - Wean off of and discontinue nitro drip as tolerated.  - Start Imdur for additional relief of chest discomfort. - Start heart healthy diet. A1C checked and 5.5 - no need for carb modified diet. - Continue DAPT with ASA and prasugrel for at least 12 months. - Continue lopressor and titrate as HR and BP allow. SL nitro as needed for CP. - Aggressive secondary prevention with high intensity statin therapy.  - Will plan to monitor for at least one additional day. - Follow-up TCM appointment in the office in 1-2 weeks.  HLD - LDL 166. Total cholesterol 226. - Continue statin therapy. Goal LDL <70 - F/u lipid and liver panel in 6-8 weeks following start of statin therapy.   For questions or updates, please contact [place] Please consult [email or web address] for contact info under  Signed, Arvil Chaco, PA-C  11/11/2018, 7:48 AM

## 2018-11-11 NOTE — Progress Notes (Signed)
Pt transferred from ICU via wc, no distress on ra.  Cardiac monitor placed on pt and verified with Keda, CNA, pt denies chest pain at this time.  Skin intact and verified with Serenity RN.  Dressing dry and intact to rt radial area, no swelling noted.  Oriented to room and surrounding, POC reviewed with patient.  CB in reach, SR up x2.

## 2018-11-11 NOTE — Progress Notes (Signed)
Late entry: Report called to Tanya on unit 2A, pt going to room 247. Pt transported by tech to room 247. All belonging transported with pt.

## 2018-11-12 ENCOUNTER — Telehealth: Payer: Self-pay

## 2018-11-12 LAB — CBC
HCT: 38.6 % — ABNORMAL LOW (ref 39.0–52.0)
Hemoglobin: 13.4 g/dL (ref 13.0–17.0)
MCH: 32.7 pg (ref 26.0–34.0)
MCHC: 34.7 g/dL (ref 30.0–36.0)
MCV: 94.1 fL (ref 80.0–100.0)
Platelets: 164 10*3/uL (ref 150–400)
RBC: 4.1 MIL/uL — ABNORMAL LOW (ref 4.22–5.81)
RDW: 12.3 % (ref 11.5–15.5)
WBC: 11.1 10*3/uL — ABNORMAL HIGH (ref 4.0–10.5)
nRBC: 0 % (ref 0.0–0.2)

## 2018-11-12 LAB — POCT ACTIVATED CLOTTING TIME
Activated Clotting Time: 285 seconds
Activated Clotting Time: 290 seconds
Activated Clotting Time: 285 seconds

## 2018-11-12 MED ORDER — LISINOPRIL 5 MG PO TABS: 5.0000 mg | ORAL_TABLET | Freq: Every day | ORAL | 1 refills | Status: DC

## 2018-11-12 MED ORDER — ISOSORBIDE MONONITRATE ER 30 MG PO TB24: 15.0000 mg | ORAL_TABLET | Freq: Every day | ORAL | 1 refills | Status: DC

## 2018-11-12 MED ORDER — METOPROLOL TARTRATE 25 MG PO TABS: 12.5000 mg | ORAL_TABLET | Freq: Two times a day (BID) | ORAL | 1 refills | Status: DC

## 2018-11-12 MED ORDER — LISINOPRIL 5 MG PO TABS
5.0000 mg | ORAL_TABLET | Freq: Every day | ORAL | Status: DC
  Administered 2018-11-12: 5 mg via ORAL
  Filled 2018-11-12: qty 1

## 2018-11-12 MED ORDER — PRASUGREL HCL 10 MG PO TABS: 10.0000 mg | ORAL_TABLET | Freq: Every day | ORAL | 2 refills | Status: DC

## 2018-11-12 MED ORDER — ATORVASTATIN CALCIUM 80 MG PO TABS: 80.0000 mg | ORAL_TABLET | Freq: Every day | ORAL | 2 refills | Status: DC

## 2018-11-12 MED ORDER — NITROGLYCERIN 0.4 MG SL SUBL: 0.4000 mg | SUBLINGUAL_TABLET | SUBLINGUAL | 2 refills | Status: DC | PRN

## 2018-11-12 MED ORDER — ASPIRIN 81 MG PO TBEC: 81.0000 mg | DELAYED_RELEASE_TABLET | Freq: Every day | ORAL | 1 refills | Status: AC

## 2018-11-12 MED ORDER — FAMOTIDINE 20 MG PO TABS: 20.0000 mg | ORAL_TABLET | Freq: Every day | ORAL | 0 refills | Status: DC

## 2018-11-12 NOTE — Progress Notes (Signed)
Martin Garza at [place] NAME: Martin Garza    MR#:  [id number]  DATE OF BIRTH:  Jul 05, 1950  SUBJECTIVE:   Transferred out of ICU. Denies any chest pain. Ambulating in the room. REVIEW OF SYSTEMS:   Review of Systems  Constitutional: Negative for chills, fever and weight loss.  HENT: Negative for ear discharge, ear pain and nosebleeds.   Eyes: Negative for blurred vision, pain and discharge.  Respiratory: Negative for sputum production, shortness of breath, wheezing and stridor.   Cardiovascular: Negative for palpitations, orthopnea and PND.  Gastrointestinal: Negative for abdominal pain, diarrhea, nausea and vomiting.  Genitourinary: Negative for frequency and urgency.  Musculoskeletal: Negative for back pain and joint pain.  Neurological: Negative for sensory change, speech change, focal weakness and weakness.  Psychiatric/Behavioral: Negative for depression and hallucinations. The patient is not nervous/anxious.    Tolerating Diet:yes Tolerating PT: self ambulatory  DRUG ALLERGIES:  No Known Allergies  VITALS:  Blood pressure (!) 144/81, pulse 72, temperature 98.4 F (36.9 C), temperature source Oral, resp. rate 19, height 5' 10.5" (1.791 m), weight 91.9 kg, SpO2 95 %.  PHYSICAL EXAMINATION:   Physical Exam  GENERAL:  68 y.o.-year-old patient lying in the bed with no acute distress.  EYES: Pupils equal, round, reactive to light and accommodation. No scleral icterus. Extraocular muscles intact.  HEENT: Head atraumatic, normocephalic. Oropharynx and nasopharynx clear.  NECK:  Supple, no jugular venous distention. No thyroid enlargement, no tenderness.  LUNGS: Normal breath sounds bilaterally, no wheezing, rales, rhonchi. No use of accessory muscles of respiration.  CARDIOVASCULAR: S1, S2 normal. No murmurs, rubs, or gallops.  ABDOMEN: Soft, nontender, nondistended. Bowel sounds present. No organomegaly or mass.  EXTREMITIES:  No cyanosis, clubbing or edema b/l.    NEUROLOGIC: Cranial nerves II through XII are intact. No focal Motor or sensory deficits b/l.   PSYCHIATRIC:  patient is alert and oriented x 3.  SKIN: No obvious rash, lesion, or ulcer.   LABORATORY PANEL:  CBC Recent Labs  Lab 11/12/18 0559  WBC 11.1*  HGB 13.4  HCT 38.6*  PLT 164    Chemistries  Recent Labs  Lab 11/11/18 0218  NA 139  K 3.9  CL 104  CO2 24  GLUCOSE 116*  BUN 17  CREATININE 0.92  CALCIUM 8.4*   Cardiac Enzymes Recent Labs  Lab 11/11/18 0814  TROPONINI 5.62*   RADIOLOGY:  No results found. ASSESSMENT AND PLAN:  Martin Garza  is a 68 y.o. male presents with chest pain going on and off for the past 2 to 3 days.  He felt worse today.  Previously he has developed chest pain when he was doing something.  When he sits that eases off.  1.  Acute coronary syndrome/NSTEMI - Symptoms worsening over the past few days.   - elevated troponin.s x3 - on aspirin, heparin drip-- now discontinued -continue beta-blocker and Lipitor -now on IV nitor and IV Aggrastat drip post cath -s/p Cath by Dr END Severe single-vessel CAD with sequential 95% proximal irregular 40-60% mid, and focal 80-90% distal LAD stenoses. Successful PCI to proximal LAD using Resolute Onyx 3.0 x 12 mm DES (post-dilated to 3.6 mm) complicated by occlusion of the distal LAD (most likely due to thrombus/plaque embolization).  PCI was performed in the distal LAD using Resolute Onyx 2.0 x 18 mm DES but flow could never be restored to the apex. -?ace inhibitor per cardiology rec  2.  GERD we will  put on Pepcid   3. Hyperlipidemia continue statins   CODE STATUS: full  DVT Prophylaxis: scd heparin TOTAL TIME TAKING CARE OF THIS PATIENT: *30 minutes.  >50% time spent on counselling and coordination of care  POSSIBLE D/C IN *1-2* DAYS, DEPENDING ON CLINICAL CONDITION.  Note: This dictation was prepared with Dragon dictation along with smaller phrase  technology. Any transcriptional errors that result from this process are unintentional.  Fritzi Mandes M.D on 11/12/2018 at 9:22 AM  Between 7am to 6pm - Pager - [phone]  After 6pm go to [email or web address] - password [email or web address] [facility]  Office  [phone]  CC: Primary care physician; Ria Bush, MDPatient ID: Martin Garza, male   DOB: Oct 10, 1950, 68 y.o.   MRN: [id number]

## 2018-11-12 NOTE — Discharge Summary (Signed)
[place] at [place] NAME: Martin Garza    MR#:  [id number]  DATE OF BIRTH:  [birth year]  DATE OF ADMISSION:  11/09/2018 ADMITTING PHYSICIAN: Loletha Grayer, MD  DATE OF DISCHARGE: 11/12/2018  PRIMARY CARE PHYSICIAN: Ria Bush, MD    ADMISSION DIAGNOSIS:  Unstable angina ([place]) [I20.0]  DISCHARGE DIAGNOSIS:  acute non-QW MI status post cardiac cath PCI to proximal and distal LAD hyperlipidemia SECONDARY DIAGNOSIS:   Past Medical History:  Diagnosis Date  . Allergy   . Cataract    had surg in both eyes  . Complication of anesthesia    Blood pressure goes up after waking up past sedation/ gets dizzy too per pt!  . Medical history non-contributory   . Retinal detachment 2014   left  . Umbilical hernia     HOSPITAL COURSE:   StephenMartinis a67 y.o.malepresents with chest pain going on and off for the past 2 to 3 days. He felt worse today. Previously he has developed chest pain when he was doing something. When he sits that eases off.  1. Acute coronary syndrome/NSTEMI  - on aspirin,beta-blocker, lisinopril, Imdur and Lipitor -s/p Cath by Dr END Severe single-vessel CAD with sequential 95% proximal irregular 40-60% mid, and focal 80-90% distal LAD stenoses. Successful PCI to proximal LAD using Resolute Onyx 3.0 x 12 mm DES (post-dilated to 3.6 mm) complicated by occlusion of the distal LAD (most likely due to thrombus/plaque embolization). PCI was performed in the distal LAD using Resolute Onyx 2.0 x 18 mm DES but flow could never be restored to the apex.  2. GERD po antacid  3. Hyperlipidemia continue statins  patient overall doing well. Will discharge to home with outpatient follow-up with Dr.End. Patient will be set up for cardiac rehab as outpatient.  CONSULTS OBTAINED:  Treatment Team:  Nelva Bush, MD  DRUG ALLERGIES:  No Known Allergies  DISCHARGE MEDICATIONS:   Allergies as of  11/12/2018   No Known Allergies     Medication List    STOP taking these medications   HYDROcodone-acetaminophen 5-325 MG tablet Commonly known as: NORCO/VICODIN   ibuprofen 200 MG tablet Commonly known as: ADVIL     TAKE these medications   acetaminophen 325 MG tablet Commonly known as: TYLENOL Take 650 mg by mouth every 6 (six) hours as needed for moderate pain.   aspirin 81 MG EC tablet Take 1 tablet (81 mg total) by mouth daily.   atorvastatin 80 MG tablet Commonly known as: LIPITOR Take 1 tablet (80 mg total) by mouth daily at 6 PM.   B-12 PO Take 1,000 mcg by mouth daily.   famotidine 20 MG tablet Commonly known as: PEPCID Take 1 tablet (20 mg total) by mouth daily. Start taking on: November 13, 2018   isosorbide mononitrate 30 MG 24 hr tablet Commonly known as: IMDUR Take 0.5 tablets (15 mg total) by mouth daily.   lisinopril 5 MG tablet Commonly known as: ZESTRIL Take 1 tablet (5 mg total) by mouth daily.   metoprolol tartrate 25 MG tablet Commonly known as: LOPRESSOR Take 0.5 tablets (12.5 mg total) by mouth 2 (two) times daily.   nitroGLYCERIN 0.4 MG SL tablet Commonly known as: NITROSTAT Place 1 tablet (0.4 mg total) under the tongue every 5 (five) minutes x 3 doses as needed for chest pain.   prasugrel 10 MG Tabs tablet Commonly known as: EFFIENT Take 1 tablet (10 mg total) by mouth daily. Start taking on: November 13, 2018   vitamin C 500 MG tablet Commonly known as: ASCORBIC ACID Take 1,000 mg by mouth daily.       If you experience worsening of your admission symptoms, develop shortness of breath, life threatening emergency, suicidal or homicidal thoughts you must seek medical attention immediately by calling 911 or calling your MD immediately  if symptoms less severe.  You Must read complete instructions/literature along with all the possible adverse reactions/side effects for all the Medicines you take and that have been prescribed to you. Take  any new Medicines after you have completely understood and accept all the possible adverse reactions/side effects.   Please note  You were cared for by a hospitalist during your hospital stay. If you have any questions about your discharge medications or the care you received while you were in the hospital after you are discharged, you can call the unit and asked to speak with the hospitalist on call if the hospitalist that took care of you is not available. Once you are discharged, your primary care physician will handle any further medical issues. Please note that NO REFILLS for any discharge medications will be authorized once you are discharged, as it is imperative that you return to your primary care physician (or establish a relationship with a primary care physician if you do not have one) for your aftercare needs so that they can reassess your need for medications and monitor your lab values. Today   SUBJECTIVE   No new complaints  VITAL SIGNS:  Blood pressure (!) 144/81, pulse 72, temperature 98.4 F (36.9 C), temperature source Oral, resp. rate 19, height 5' 10.5" (1.791 m), weight 91.9 kg, SpO2 95 %.  I/O:    Intake/Output Summary (Last 24 hours) at 11/12/2018 0919 Last data filed at 11/12/2018 0916 Gross per 24 hour  Intake 238.5 ml  Output 1400 ml  Net -1161.5 ml    PHYSICAL EXAMINATION:  GENERAL:  68 y.o.-year-old patient lying in the bed with no acute distress.  EYES: Pupils equal, round, reactive to light and accommodation. No scleral icterus. Extraocular muscles intact.  HEENT: Head atraumatic, normocephalic. Oropharynx and nasopharynx clear.  NECK:  Supple, no jugular venous distention. No thyroid enlargement, no tenderness.  LUNGS: Normal breath sounds bilaterally, no wheezing, rales,rhonchi or crepitation. No use of accessory muscles of respiration.  CARDIOVASCULAR: S1, S2 normal. No murmurs, rubs, or gallops.  ABDOMEN: Soft, non-tender, non-distended. Bowel sounds  present. No organomegaly or mass.  EXTREMITIES: No pedal edema, cyanosis, or clubbing.  NEUROLOGIC: Cranial nerves II through XII are intact. Muscle strength 5/5 in all extremities. Sensation intact. Gait not checked.  PSYCHIATRIC: The patient is alert and oriented x 3.  SKIN: No obvious rash, lesion, or ulcer.   DATA REVIEW:   CBC  Recent Labs  Lab 11/12/18 0559  WBC 11.1*  HGB 13.4  HCT 38.6*  PLT 164    Chemistries  Recent Labs  Lab 11/11/18 0218  NA 139  K 3.9  CL 104  CO2 24  GLUCOSE 116*  BUN 17  CREATININE 0.92  CALCIUM 8.4*    Microbiology Results   Recent Results (from the past 240 hour(s))  SARS Coronavirus 2 (CEPHEID - Performed in [facility] hospital lab), Hosp Order     Status: None   Collection Time: 11/09/18  5:52 PM   Specimen: Nasopharyngeal Swab  Result Value Ref Range Status   SARS Coronavirus 2 NEGATIVE NEGATIVE Final    Comment: (NOTE) If result is NEGATIVE  SARS-CoV-2 target nucleic acids are NOT DETECTED. The SARS-CoV-2 RNA is generally detectable in upper and lower  respiratory specimens during the acute phase of infection. The lowest  concentration of SARS-CoV-2 viral copies this assay can detect is 250  copies / mL. A negative result does not preclude SARS-CoV-2 infection  and should not be used as the sole basis for treatment or other  patient management decisions.  A negative result may occur with  improper specimen collection / handling, submission of specimen other  than nasopharyngeal swab, presence of viral mutation(s) within the  areas targeted by this assay, and inadequate number of viral copies  (<250 copies / mL). A negative result must be combined with clinical  observations, patient history, and epidemiological information. If result is POSITIVE SARS-CoV-2 target nucleic acids are DETECTED. The SARS-CoV-2 RNA is generally detectable in upper and lower  respiratory specimens dur ing the acute phase of infection.  Positive   results are indicative of active infection with SARS-CoV-2.  Clinical  correlation with patient history and other diagnostic information is  necessary to determine patient infection status.  Positive results do  not rule out bacterial infection or co-infection with other viruses. If result is PRESUMPTIVE POSTIVE SARS-CoV-2 nucleic acids MAY BE PRESENT.   A presumptive positive result was obtained on the submitted specimen  and confirmed on repeat testing.  While 2019 novel coronavirus  (SARS-CoV-2) nucleic acids may be present in the submitted sample  additional confirmatory testing may be necessary for epidemiological  and / or clinical management purposes  to differentiate between  SARS-CoV-2 and other Sarbecovirus currently known to infect humans.  If clinically indicated additional testing with an alternate test  methodology [phone]) is advised. The SARS-CoV-2 RNA is generally  detectable in upper and lower respiratory sp ecimens during the acute  phase of infection. The expected result is Negative. Fact Sheet for Patients:  StrictlyIdeas.no Fact Sheet for Healthcare Providers: BankingDealers.co.za This test is not yet approved or cleared by the [place] FDA and has been authorized for detection and/or diagnosis of SARS-CoV-2 by FDA under an Emergency Use Authorization (EUA).  This EUA will remain in effect (meaning this test can be used) for the duration of the COVID-19 declaration under Section 564(b)(1) of the Act, 21 U.S.C. section 360bbb-3(b)(1), unless the authorization is terminated or revoked sooner. Performed at [facility], [address] [place], [place] [address]   MRSA PCR Screening     Status: None   Collection Time: 11/10/18  4:36 PM   Specimen: Nasopharyngeal  Result Value Ref Range Status   MRSA by PCR NEGATIVE NEGATIVE Final    Comment:        The GeneXpert MRSA Assay (FDA approved for NASAL  specimens only), is one component of a comprehensive MRSA colonization surveillance program. It is not intended to diagnose MRSA infection nor to guide or monitor treatment for MRSA infections. Performed at [facility], [address] [place], [place] [address]     RADIOLOGY:  No results found.   CODE STATUS:     Code Status Orders  (From admission, onward)         Start     Ordered   11/09/18 1824  Full code  Continuous     11/09/18 1825        Code Status History    This patient has a current code status but no historical code status.   Advance Care Planning Activity  TOTAL TIME TAKING CARE OF THIS PATIENT: **40* minutes.    Fritzi Mandes M.D on 11/12/2018 at 9:19 AM  Between 7am to 6pm - Pager - [phone] After 6pm go to [email or web address] - password [email or web address] [facility]  Office  [phone]  CC: Primary care physician; Ria Bush, MD

## 2018-11-12 NOTE — Care Management Important Message (Signed)
Important Message  Patient Details  Name: Martin Garza MRN: [id number] Date of Birth: [birth year]   Medicare Important Message Given:  Yes     Dannette Barbara 11/12/2018, 10:59 AM

## 2018-11-12 NOTE — TOC Benefit Eligibility Note (Signed)
Transition of Care [facility]) Benefit Eligibility Note    Patient Details  Name: EDDER BELLANCA MRN: [id number] Date of Birth: Jan 01, 1951   Medication/Dose: Effient 10mg  PO daily  Covered?: No  Tier: Other(If approved, considered Tier 4)  Prescription Coverage Preferred Pharmacy: [facility]  Spoke with Person/Company/Phone Number:: Ellison Hughs with [employer] at [phone]  Co-Pay: If approved: $90 estimated copay for 30 day supply & $180 estimated copay for 90 day supply.  Prior Approval: Yes(PA required for name brand: [phone])  Deductible: (No deductible on plan.)  Additional Notes: Rep found two alternatives for Effient.  Clopidogrel 75mg  - Considered Tier 1 with no PA required.  $5 estimated copay for 30 day supply, $10 for 90 day supply.  Prasugrel 5mg  or 10mg  - Considered Tier 4 with no PA required.  $90 estimated copay for 30 day supply, $180 for 90 day supply.    Dannette Barbara Phone Number: [phone] or [phone] 11/12/2018, 10:14 AM

## 2018-11-12 NOTE — Progress Notes (Signed)
Patient walked around the nurses station x2 with no issues. No complaints of pain or shortness of breath. MD notified.

## 2018-11-12 NOTE — Progress Notes (Signed)
Patient given discharge instructions. IV's taken out and tele monitor off. Patient asked if he wanted me to call wife or anyone else to go over discharge but he declined. Patient verbalized understanding with further questions or concerns.

## 2018-11-12 NOTE — Telephone Encounter (Signed)
Attempted to reach patient to complete TCM call. Attempt was unsuccessful. Left message with contact info.

## 2018-11-12 NOTE — Progress Notes (Signed)
Cardiac Rehab Navigator/ Exercise Physiologist Note  Rounded on patient sitting in recliner. Patient denies chest pain, reports feeling much better. Patient was ready to go home. Patient had walked around the nurses' station several times without any chest pain.   "Heart Attack Bouncing Back" booklet given and reviewed with patient. Discussed the definition of CAD. Reviewed the location of CAD and where his stent was placed. Informed patient he will be given a stent card. Explained the purpose of the stent card. Instructed patient to keep stent card in his wallet.  Discussed modifiable risk factors including controlling blood pressure, cholesterol, and blood sugar; following heart healthy diet; maintaining healthy weight; exercise; and smoking cessation, not applicable.  Discussed cardiac medications including rationale for taking, mechanisms of action, and side effects. Stressed the importance of taking medications as prescribed.  Discussed emergency plan for heart attack symptoms. Patient verbalized understanding of need to call 911 and not to drive himself to ER if having cardiac symptoms / chest pain.  Diet of low sodium, low fat, low cholesterol heart healthy diet discussed. Information on diet provided.   Smoking Cessation - Patient is a FORMER chew tobacco user.  Exercise - Benefits of exercised discussed. Patient reports being very active at work but not exercising. Informed patient his cardiologist has referred him to outpatient Cardiac Rehab. An overview of the program was provided. Brochure and Physicist, medical. Patient is interested in participating. Patient informed the Cardiac Rehab department is currently closed due to the COVID-19 pandemic. Patient was informed that the department will reopen soon. Patient wants to wait 2 weeks before scheduling for orientation. Patient was trying to work out transportation home on the phone.   Patient appreciative of the information.  Jasper Loser, [facility]  Exercise Physiologist Department Phone #: [phone] Fax: [phone]  Direct Line [phone] Email Address: [email or web address]

## 2018-11-12 NOTE — TOC Transition Note (Signed)
Transition of Care [facility]) - CM/SW Discharge Note   Patient Details  Name: Martin Garza MRN: [id number] Date of Birth: May 15, 1951  Transition of Care [facility]) CM/SW Contact:  Elza Rafter, RN Phone Number: 11/12/2018, 11:27 AM   Clinical Narrative:   Patient is discharging to home today.  Starting on Effient.  Benefits check witll be $90.00/mo with insurance.  Provided patient with Good RX for [employer].  Co-pay will be $15.99.  He is able to afford that.  Wife will transport home.      Final next level of care: Home/Self Care Barriers to Discharge: No Barriers Identified   Patient Goals and CMS Choice        Discharge Placement                       Discharge Plan and Services                                     Social Determinants of Health (SDOH) Interventions     Readmission Risk Interventions No flowsheet data found.

## 2018-11-12 NOTE — Progress Notes (Signed)
Progress Note  Patient Name: Martin Garza Date of Encounter: 11/12/2018  Primary Cardiologist: new to [facility] - consult by End  Subjective   No further chest pain this morning. No SOB. No cath site complications. Has ambulated in his room without issues. BP mildly elevated in the 532D to 924Q systolic.   Inpatient Medications    Scheduled Meds: . aspirin EC  81 mg Oral Daily  . atorvastatin  80 mg Oral q1800  . famotidine  20 mg Oral Daily  . heparin  5,000 Units Subcutaneous Q8H  . isosorbide mononitrate  15 mg Oral Daily  . lisinopril  5 mg Oral Daily  . metoprolol tartrate  12.5 mg Oral BID  . prasugrel  10 mg Oral Daily  . sodium chloride flush  3 mL Intravenous Q12H  . vitamin B-12  1,000 mcg Oral Daily  . vitamin C  1,000 mg Oral Daily   Continuous Infusions: . sodium chloride 30 mL/hr at 11/11/18 0800  . sodium chloride     PRN Meds: sodium chloride, acetaminophen, HYDROcodone-acetaminophen, morphine injection, nitroGLYCERIN, ondansetron (ZOFRAN) IV, sodium chloride flush   Vital Signs    Vitals:   11/11/18 1609 11/11/18 2045 11/12/18 0305 11/12/18 0823  BP: 122/72 (!) 151/79 (!) 147/85 (!) 144/81  Pulse: (!) 57 67 68 72  Resp: 18 20  19   Temp: 98.2 F (36.8 C) 99.3 F (37.4 C) 99 F (37.2 C) 98.4 F (36.9 C)  TempSrc: Oral Oral Oral Oral  SpO2: 97% 99% 96% 95%  Weight:      Height:        Intake/Output Summary (Last 24 hours) at 11/12/2018 0901 Last data filed at 11/11/2018 2315 Gross per 24 hour  Intake 235.5 ml  Output 1400 ml  Net -1164.5 ml   Filed Weights   11/09/18 1623 11/10/18 1642 11/11/18 1343  Weight: 87.5 kg 92.7 kg 91.9 kg    Telemetry    SR - Personally Reviewed  ECG    6/23: NSR, 61 bpm, anterolateral TWI, mild QT prolongation - Personally Reviewed  Physical Exam   GEN: No acute distress.   Neck: No JVD. Cardiac: RRR, no murmurs, rubs, or gallops. Right radial cath site is well healing without active bleeding, mild  bruising, no erythema, warmth, or TTP.  Respiratory: Clear to auscultation bilaterally.  GI: Soft, nontender, non-distended.   MS: No edema; No deformity. Neuro:  Alert and oriented x 3; Nonfocal.  Psych: Normal affect.  Labs    Chemistry Recent Labs  Lab 11/09/18 1627 11/11/18 0218  NA 142 139  K 3.8 3.9  CL 109 104  CO2 24 24  GLUCOSE 122* 116*  BUN 23 17  CREATININE 1.23 0.92  CALCIUM 9.4 8.4*  GFRNONAA 60* >60  GFRAA >60 >60  ANIONGAP 9 11     Hematology Recent Labs  Lab 11/10/18 1946 11/11/18 0218 11/12/18 0559  WBC 8.6 9.7 11.1*  RBC 4.12* 3.84* 4.10*  HGB 13.4 12.4* 13.4  HCT 39.4 36.6* 38.6*  MCV 95.6 95.3 94.1  MCH 32.5 32.3 32.7  MCHC 34.0 33.9 34.7  RDW 12.6 12.5 12.3  PLT 203 190 164    Cardiac Enzymes Recent Labs  Lab 11/10/18 1702 11/10/18 1946 11/11/18 0218 11/11/18 0814  TROPONINI 0.74* 1.30* 4.12* 5.62*   No results for input(s): TROPIPOC in the last 168 hours.   BNPNo results for input(s): BNP, PROBNP in the last 168 hours.   DDimer No results for input(s): DDIMER  in the last 168 hours.   Radiology    No results found.  Cardiac Studies   LHC 11/10/2018: Conclusions: 1. Severe single-vessel CAD with sequential 95% proximal irregular 40-60% mid, and focal 80-90% distal LAD stenoses. 2. Mildly elevated left ventricular filling pressure. 3. Successful PCI to proximal LAD using Resolute Onyx 3.0 x 12 mm drug-eluting stent (post-dilated to 3.6 mm) complicated by occlusion of the distal LAD (most likely due to thrombus/plaque embolization). PCI was performed in the distal LAD using Resolute Onyx 2.0 x 18 mm drug-eluting stent but flow could never be restored to the apex.  Recommendations: 1. Transfer to ICU for medical therapy and monitoring. 2. Titrate nitroglycerin infusion for relief of chest pain. IV morphine can be added if chest pain remains uncontrolled. 3. Continue tirofiban for 18 hours. 4. Dual antiplatelet therapy with  aspirin and prasugrel for at least 12 months. 5. Aggressive secondary prevention. 6. Obtain echo. __________  2D Echo 11/10/2018: 1. Severe hypokinesis of the left ventricular, apical septal wall, apical segment and anterior segment.  2. The left ventricle has normal systolic function, with an ejection fraction of 55-60%. The cavity size was normal. There is moderately increased left ventricular wall thickness. Left ventricular diastolic Doppler parameters are consistent with impaired relaxation.  3. The right ventricle has normal systolic function. The cavity was normal. There is no increase in right ventricular wall thickness. Right ventricular systolic pressure could not be assessed.  4. The aortic valve is tricuspid.  5. The interatrial septum was not well visualized.  Patient Profile     68 y.o. male with history of retinal detachment who was admitted with a NSTEMI.   Assessment & Plan    1. NSTEMI: -Status post LHC as above with succcessful PCI/DES to the proximal LAD complicated by by occlusion of the distal LAD (most likely due to thrombus/plaque embolization). PCI was performed in the distal LAD using Resolute Onyx 2.0 x 18 mm drug-eluting stent but flow could never be restored to the apex -No further chest pain -Continue DAPT with ASA and Effient without interruption for at least the next 12 months -Metoprolol, Imdur, Lipitor, lisinopril -Cardiac rehab when open -Post cath instructions  2. HLD: -LDL of 166 this admission with a goal LDL of < 70 -Lipitor added this admission -Recheck FLP and LFT in ~ 8 weeks   3. HTN: -Blood pressure is mildly elevated this morning in the 170Y to 174B systolic -Add lisinopril (he will need a follow up BMET in hospital follow up) -Continue metoprolol and Imdur  For questions or updates, please contact [place] Please consult [email or web address] for contact info under Cardiology/STEMI.    Signed, Christell Faith, PA-C [place] Pager:  [phone] 11/12/2018, 9:01 AM

## 2018-11-13 NOTE — Telephone Encounter (Signed)
Transitional Care Management Follow-up Telephone Call    Date discharged? 11/12/2018  How have you been since you were released from the hospital? [place]. Generalized weakness.   Any patient concerns? Concerns with side effects of new medications. States he is feeling "woozy".    Items Reviewed:  Medications reviewed: Yes  Allergies reviewed: Yes  Dietary changes reviewed: Yes  Referrals reviewed: Yes   Functional Questionnaire:  Independent - I Dependent - D    Activities of Daily Living (ADLs):    Personal hygiene - I Dressing - I Eating - I Maintaining continence - I Transferring - I   Independent Activities of Daily Living (iADLs): Basic communication skills - I Transportation - I Meal preparation  - I Shopping - I Housework - I Managing medications - I  Managing personal finances - I   Confirmed importance and date/time of follow-up visits scheduled YES  Provider Appointment booked with PCP 11/14/18 @ 1200  Confirmed with patient if condition begins to worsen call PCP or go to the ER.  Patient was given the office number and encouraged to call back with question or concerns: YES

## 2018-11-13 NOTE — Telephone Encounter (Signed)
Inbound call from patient. States he is returning phone call. Phone was disconnected. Attempted to reach patient but received VM. Left message with contact info.

## 2018-11-14 ENCOUNTER — Ambulatory Visit (INDEPENDENT_AMBULATORY_CARE_PROVIDER_SITE_OTHER): Payer: PPO | Admitting: Family Medicine

## 2018-11-14 ENCOUNTER — Encounter: Payer: Self-pay | Admitting: Family Medicine

## 2018-11-14 DIAGNOSIS — E785 Hyperlipidemia, unspecified: Secondary | ICD-10-CM | POA: Diagnosis not present

## 2018-11-14 DIAGNOSIS — I214 Non-ST elevation (NSTEMI) myocardial infarction: Secondary | ICD-10-CM

## 2018-11-14 NOTE — Progress Notes (Signed)
   RAHMAN FERRALL - 68 y.o. male  MRN [id number]  Date of Birth: Apr 02, 1951  PCP: Ria Bush, MD  This service was provided via telemedicine. Phone Visit performed on 11/14/2018.   Rationale for phone visit along with limitations reviewed. Patient consented to telephone encounter.    Location of patient: home Location of provider: in office, [place] [facility] Name of referring provider: N/A   Names of persons and role in encounter: Provider: Ria Bush, MD  Patient: Martin Garza  Other: N/A   Time on call: 12:08pm - 12:30pm  Subjective: Chief Complaint  Patient presents with  . Hospitalization Follow-up     HPI:  Recent hospitalization for several days of chest pain, found to have acute MI with severe occlusion of LAD s/p PCI to proximal and then distal LAD. Discharged on aspirin, beta blocker, lisinopril, imdur and lipitor. Planning to establish with cardiac rehab. Started on effient and aspirin 81mg  daily.   Upcoming cards appt next week.   BP low while in hospital as well as while at home - 40-981X systolic. Denies significant low bp symptoms of orthostatic dizziness or lightheadedness. Staying very tired, but feels this is improving slowly each day.    DATE OF ADMISSION:  11/09/2018 DATE OF DISCHARGE: 11/12/2018 TCM phone call completed 11/12/2018  D/C diagnosis:  Acute non-QW MI status post cardiac cath PCI to proximal and distal LAD hyperlipidemia  Objective/Observations: No physical exam or vital signs collected unless specifically identified below.   BP 91/61   Pulse 81   Ht 5' 10.5" (1.791 m)   Wt 197 lb 8 oz (89.6 kg)   BMI 27.94 kg/m    Respiratory status: speaks in complete sentences without evident shortness of breath.   Assessment/Plan:  Non-ST elevation (NSTEMI) myocardial infarction [facility]) Hospitalization records reviewed with patient.  Continue statin, dual antiplatelet, further cardiac meds (isosorbide, acei, beta  blocker) and f/u with cards next week. Pending cardiac rehab restart. EF reassuringly 55-60% on echo in hospital.   Hyperlipidemia LDL goal <70 Now on high potency statin - continue lipitor 80mg  daily.   I discussed the assessment and treatment plan with the patient. The patient was provided an opportunity to ask questions and all were answered. The patient agreed with the plan and demonstrated an understanding of the instructions.  Lab Orders  No laboratory test(s) ordered today    No orders of the defined types were placed in this encounter.   The patient was advised to call back or seek an in-person evaluation if the symptoms worsen or if the condition fails to improve as anticipated.  Ria Bush, MD

## 2018-11-14 NOTE — Assessment & Plan Note (Addendum)
Hospitalization records reviewed with patient.  Continue statin, dual antiplatelet, further cardiac meds (isosorbide, acei, beta blocker) and f/u with cards next week. Pending cardiac rehab restart. EF reassuringly 55-60% on echo in hospital.

## 2018-11-14 NOTE — Assessment & Plan Note (Signed)
Now on high potency statin - continue lipitor 80mg  daily.

## 2018-11-18 ENCOUNTER — Telehealth: Payer: Self-pay

## 2018-11-18 NOTE — Telephone Encounter (Signed)

## 2018-11-19 ENCOUNTER — Other Ambulatory Visit: Payer: Self-pay

## 2018-11-19 ENCOUNTER — Encounter: Payer: Self-pay | Admitting: Nurse Practitioner

## 2018-11-19 ENCOUNTER — Ambulatory Visit (INDEPENDENT_AMBULATORY_CARE_PROVIDER_SITE_OTHER): Payer: PPO | Admitting: Nurse Practitioner

## 2018-11-19 VITALS — BP 124/60 | HR 66 | Ht 71.0 in | Wt 203.8 lb

## 2018-11-19 DIAGNOSIS — I2510 Atherosclerotic heart disease of native coronary artery without angina pectoris: Secondary | ICD-10-CM

## 2018-11-19 DIAGNOSIS — I214 Non-ST elevation (NSTEMI) myocardial infarction: Secondary | ICD-10-CM | POA: Diagnosis not present

## 2018-11-19 DIAGNOSIS — I249 Acute ischemic heart disease, unspecified: Secondary | ICD-10-CM

## 2018-11-19 DIAGNOSIS — I10 Essential (primary) hypertension: Secondary | ICD-10-CM | POA: Diagnosis not present

## 2018-11-19 DIAGNOSIS — I255 Ischemic cardiomyopathy: Secondary | ICD-10-CM | POA: Diagnosis not present

## 2018-11-19 DIAGNOSIS — E785 Hyperlipidemia, unspecified: Secondary | ICD-10-CM

## 2018-11-19 MED ORDER — METOPROLOL TARTRATE 25 MG PO TABS: 12.5000 mg | ORAL_TABLET | Freq: Two times a day (BID) | ORAL | 6 refills | Status: DC

## 2018-11-19 MED ORDER — LISINOPRIL 5 MG PO TABS: 5.0000 mg | ORAL_TABLET | Freq: Every day | ORAL | 6 refills | Status: DC

## 2018-11-19 NOTE — Progress Notes (Signed)
Office Visit    Patient Name: Martin Garza Date of Encounter: 11/19/2018  Primary Care Provider:  Ria Bush, MD Primary Cardiologist:  Nelva Bush, MD  Chief Complaint    68 year old male without prior cardiac history, who was recently admitted with non-ST segment elevation myocardial infarction requiring LAD stenting, hypertension, hyperlipidemia, and ischemic cardiomyopathy, who presents for post hospital follow-up.  Past Medical History    Past Medical History:  Diagnosis Date  . Allergy   . CAD (coronary artery disease)    a. 10/2018 NSTEMI/PCI: LM nl, LAD 95p (3.0x12 Resolute Onyx DES), 56m, 85d (2.0x18 Resolute Onyx DES), LCX nl, RCA large, 20ost, 30d.  . Cataract    had surg in both eyes  . Complication of anesthesia    Blood pressure goes up after waking up past sedation/ gets dizzy too per pt!  . Essential hypertension   . Hyperlipidemia LDL goal <70   . Ischemic cardiomyopathy    a. 10/2018 Echo: EF 55-60%, sev apical septal, apical, and ant HK. DD. Nl RV fxn.  . Medical history non-contributory   . Retinal detachment 2014   left  . Umbilical hernia    Past Surgical History:  Procedure Laterality Date  . CATARACT EXTRACTION  2018   Bil  . COLONOSCOPY  04/2018   TA, SSP, diverticulosis, rpt 3 yrs ([facility]); with polypectom y  . CORONARY STENT INTERVENTION N/A 11/10/2018   Procedure: CORONARY STENT INTERVENTION;  Surgeon: Nelva Bush, MD;  Location: [place] [facility];  Service: Cardiovascular;  Laterality: N/A;  . LEFT HEART CATH AND CORONARY ANGIOGRAPHY N/A 11/10/2018   Procedure: LEFT HEART CATH AND CORONARY ANGIOGRAPHY;  Surgeon: Nelva Bush, MD;  Location: [place] [facility];  Service: Cardiovascular;  Laterality: N/A;  . Left inguinal repair  in [employer] School  . RETINAL DETACHMENT SURGERY Left    x 2 in 2014  . UMBILICAL HERNIA REPAIR N/A 05/30/2018   Procedure: LAPAROSCOPIC ASSISTED  UMBILICAL HERNIA REPAIR WITH MESH,  ERAS  PATHWAY;  Surgeon: Johnathan Hausen, MD;  Location: [facility];  Service: General;  Laterality: N/A;    Allergies  No Known Allergies  History of Present Illness    68 year old male who did not have a prior cardiac history but was admitted to [facility] in June with dyspnea and pain between his shoulder blades.  He had mild troponin elevation up to 0.56.  Diagnostic catheterization revealed severe proximal and distal LAD disease and 2 drug-eluting stents were placed.  He otherwise had nonobstructive RCA disease.  Follow-up echocardiogram showed an EF of 55 to 60% with severe apical septal, apical, and anterior hypokinesis.  He was treated with aspirin, Prasugrel, beta-blocker, statin, ACE inhibitor, and low-dose nitrate therapy (Initiated in the setting of ongoing chest pain post PCI).    Since his discharge, he has not had any chest pain or dyspnea.  He has noted fatigue with blood pressures dropping into the 90s after taking his morning medicines.  He denies presyncope or syncope, palpitations, PND, orthopnea, edema, or early satiety.  Home Medications    Prior to Admission medications   Medication Sig Start Date End Date Taking? Authorizing Provider  acetaminophen (TYLENOL) 325 MG tablet Take 650 mg by mouth every 6 (six) hours as needed for moderate pain.    Yes [provider]  aspirin EC 81 MG EC tablet Take 1 tablet (81 mg total) by mouth daily. 11/12/18  Yes Fritzi Mandes, MD  atorvastatin (LIPITOR) 80 MG tablet  Take 1 tablet (80 mg total) by mouth daily at 6 PM. 11/12/18  Yes Fritzi Mandes, MD  Cyanocobalamin (B-12 PO) Take 1,000 mcg by mouth daily.   Yes [provider]  famotidine (PEPCID) 20 MG tablet Take 1 tablet (20 mg total) by mouth daily. 11/13/18  Yes Fritzi Mandes, MD  isosorbide mononitrate (IMDUR) 30 MG 24 hr tablet Take 0.5 tablets (15 mg total) by mouth daily. 11/12/18  Yes Fritzi Mandes, MD  lisinopril (ZESTRIL) 5 MG tablet Take 1 tablet (5 mg total) by mouth  daily. 11/12/18  Yes Fritzi Mandes, MD  metoprolol tartrate (LOPRESSOR) 25 MG tablet Take 0.5 tablets (12.5 mg total) by mouth 2 (two) times daily. 11/12/18  Yes Fritzi Mandes, MD  nitroGLYCERIN (NITROSTAT) 0.4 MG SL tablet Place 1 tablet (0.4 mg total) under the tongue every 5 (five) minutes x 3 doses as needed for chest pain. 11/12/18  Yes Fritzi Mandes, MD  prasugrel (EFFIENT) 10 MG TABS tablet Take 1 tablet (10 mg total) by mouth daily. 11/13/18  Yes Fritzi Mandes, MD  vitamin C (ASCORBIC ACID) 500 MG tablet Take 1,000 mg by mouth daily.   Yes [provider]    Review of Systems    Fatigue with blood pressures in the 90s after taking morning medicines.  He denies chest pain, palpitations, dyspnea, PND, orthopnea, dizziness, syncope, edema, or early satiety.  All other systems reviewed and are otherwise negative except as noted above.  Physical Exam    VS:  BP 124/60 (BP Location: Left Arm, Patient Position: Sitting, Cuff Size: Normal)   Pulse 66   Ht 5\' 11"  (1.803 m)   Wt 203 lb 12.8 oz (92.4 kg)   BMI 28.42 kg/m  , BMI Body mass index is 28.42 kg/m. GEN: Well nourished, well developed, in no acute distress. HEENT: normal. Neck: Supple, no JVD, carotid bruits, or masses. Cardiac: RRR, no murmurs, rubs, or gallops. No clubbing, cyanosis, edema.  Radials/DP/PT 2+ and equal bilaterally.  Right wrist catheterization site without bleeding, bruit, or hematoma. Respiratory:  Respirations regular and unlabored, clear to auscultation bilaterally. GI: Soft, nontender, nondistended, BS + x 4. MS: no deformity or atrophy. Skin: warm and dry, no rash. Neuro:  Strength and sensation are intact. Psych: Normal affect.  Accessory Clinical Findings    ECG personally reviewed by me today -regular sinus rhythm, 66, left axis deviation septal infarct- no acute changes.  Lab Results  Component Value Date   WBC 11.1 (H) 11/12/2018   HGB 13.4 11/12/2018   HCT 38.6 (L) 11/12/2018   MCV 94.1  11/12/2018   PLT 164 11/12/2018   Lab Results  Component Value Date   CREATININE 0.92 11/11/2018   BUN 17 11/11/2018   NA 139 11/11/2018   K 3.9 11/11/2018   CL 104 11/11/2018   CO2 24 11/11/2018   Lab Results  Component Value Date   ALT 15 02/13/2018   AST 14 02/13/2018   ALKPHOS 53 02/13/2018   BILITOT 0.5 02/13/2018   Lab Results  Component Value Date   CHOL 226 (H) 11/10/2018   HDL 44 11/10/2018   LDLCALC 166 (H) 11/10/2018   LDLDIRECT 160.0 02/13/2018   TRIG 81 11/10/2018   CHOLHDL 5.1 11/10/2018     Assessment & Plan    1.  Non-STEMI, subsequent episode of care/CAD: Recent admission with mid scapular back pain and dyspnea and mild troponin elevation.  He was found to have severe proximal distal LAD disease and this was successfully  treated with 2 drug-eluting stents.  He did have some chest discomfort following PCI and was placed on low-dose long-acting nitrate therapy.  He has been feeling fatigued with soft blood pressures throughout the day and in that setting, I will discontinue nitrate as he has not had any recurrent chest pain.  Continue low-dose beta-blocker, ACE inhibitor, aspirin, Prasugrel, and statin therapy.  He does plan to enroll in virtual cardiac rehabilitation.  2.  Essential hypertension: Stable today.  He has noted lower blood pressures at home and in that setting, I am discontinuing nitrate therapy.  Continue beta-blocker and ACE inhibitor.  Follow-up basic metabolic panel today given recent ACE inhibitor start.  3.  Hyperlipidemia: LDL 160 at the time of his admission.  Now on high potency statin therapy and tolerating well.  We will plan to follow-up lipids and LFTs in about 4 to 6 weeks.  4.  Ischemic cardiomyopathy: Anteroapical wall motion abnormality on echo with otherwise normal LV function.  Continue beta-blocker and ACE inhibitor.  Euvolemic on examination.  5.  Fatigue: In the setting of blood pressures in the 90s after taking his morning  medications.  Discontinuing nitrate therapy as above.  May need to reduce ACE inhibitor to 2.5 mg daily if soft blood pressures and fatigue persist.  6.  Disposition: Follow-up basic metabolic panel today.  Plan to have him follow-up in 4 to 6 weeks with lipids and LFTs at his next visit.   Murray Hodgkins, NP 11/19/2018, 5:35 PM

## 2018-11-19 NOTE — Patient Instructions (Signed)
Medication Instructions:  Your physician has recommended you make the following change in your medication:   STOP Isosorbide  If you need a refill on your cardiac medications before your next appointment, please call your pharmacy.   Lab work: Art gallery manager today If you have labs (blood work) drawn today and your tests are completely normal, you will receive your results only by: Marland Kitchen MyChart Message (if you have MyChart) OR . A paper copy in the mail If you have any lab test that is abnormal or we need to change your treatment, we will call you to review the results.  Testing/Procedures: None ordered  Follow-Up: At [facility], you and your health needs are our priority.  As part of our continuing mission to provide you with exceptional heart care, we have created designated Provider Care Teams.  These Care Teams include your primary Cardiologist (physician) and Advanced Practice Providers (APPs -  Physician Assistants and Nurse Practitioners) who all work together to provide you with the care you need, when you need it. You will need a follow up appointment in 4-6 weeks.  Please call our office 2 months in advance to schedule this appointment.  You may see Nelva Bush, MD or one of the following Advanced Practice Providers on your designated Care Team:   Murray Hodgkins, NP

## 2018-11-20 ENCOUNTER — Ambulatory Visit: Payer: PPO | Admitting: Family Medicine

## 2018-11-20 ENCOUNTER — Telehealth: Payer: Self-pay

## 2018-11-20 LAB — BASIC METABOLIC PANEL
BUN/Creatinine Ratio: 16 (ref 10–24)
BUN: 16 mg/dL (ref 8–27)
CO2: 21 mmol/L (ref 20–29)
Calcium: 9.3 mg/dL (ref 8.6–10.2)
Chloride: 106 mmol/L (ref 96–106)
Creatinine, Ser: 1.03 mg/dL (ref 0.76–1.27)
GFR calc Af Amer: 85 mL/min/{1.73_m2} (ref >59)
GFR calc non Af Amer: 74 mL/min/{1.73_m2} (ref >59)
Glucose: 96 mg/dL (ref 65–99)
Potassium: 4.5 mmol/L (ref 3.5–5.2)
Sodium: 144 mmol/L (ref 134–144)

## 2018-11-20 NOTE — Telephone Encounter (Signed)
Notes recorded by Frederik Schmidt, RN on 11/20/2018 at 8:11 AM EDT  lpmtcb 7/2  ------

## 2018-11-20 NOTE — Telephone Encounter (Signed)
-----   Message from Theora Gianotti, NP sent at 11/20/2018  7:54 AM EDT ----- Renal fxn/lytes wnl on lisinopril.

## 2018-11-25 ENCOUNTER — Telehealth: Payer: Self-pay | Admitting: Internal Medicine

## 2018-11-25 NOTE — Telephone Encounter (Signed)
Patient called in stating he missed a phone call with some results and would like to be called back.  Thank you

## 2018-11-25 NOTE — Telephone Encounter (Signed)
No answer. Left message to call back.   

## 2018-12-03 NOTE — Telephone Encounter (Signed)
Returned call to Pt.  Left VM advising labs WNL.  No further action needed.

## 2018-12-12 ENCOUNTER — Telehealth: Payer: Self-pay | Admitting: Internal Medicine

## 2018-12-12 NOTE — Telephone Encounter (Signed)
Please review if you would like to refill.  Looks like someone in the Hospital ordered this medication.

## 2018-12-12 NOTE — Telephone Encounter (Signed)
°*  STAT* If patient is at the pharmacy, call can be transferred to refill team.   1. Which medications need to be refilled? (please list name of each medication and dose if known) Pepcid 20 mg  2. Which pharmacy/location (including street and city if local pharmacy) is medication to be sent to? [facility] on [address]  3. Do they need a 30 day or 90 day supply? [place]

## 2018-12-12 NOTE — Telephone Encounter (Signed)
This should come from PCP

## 2018-12-15 ENCOUNTER — Telehealth: Payer: Self-pay | Admitting: Internal Medicine

## 2018-12-15 NOTE — Telephone Encounter (Signed)
Patient needs to discuss his Pepcid rx. Please call to discuss.

## 2018-12-15 NOTE — Telephone Encounter (Signed)
Spoke with patient and reviewed that this medication was started during his hospital stay and is normally managed by his primary care provider. Also discussed that it is available over the counter if needed. Requested that he reach out to PCP and if any problems give [place] a call back. He verbalized understanding with no further questions at this time.

## 2018-12-15 NOTE — Telephone Encounter (Signed)
Patient notified to contact his PCP for a refill on the Pepcid.

## 2018-12-29 NOTE — Progress Notes (Signed)
Follow-up Outpatient Visit Date: 12/31/2018  Primary Care Provider: Ria Bush, MD [place] [address]  Chief Complaint: Follow-up CAD  HPI:  Martin Garza is a 68 y.o. year-old male with history of CAD s/p PCI to proximal LAD in setting of STEMI (complicated by loss of flow in the distal LAD), HTN, HLD, and ischemic cardiomyopathy, who presents for follow-up of CAD and cardiomyopathy.  He was last seen by Ignacia Bayley, NP on 11/19/2018, at which time he was 1.5 weeks out from his NSTEMI/PCI.  He notes some fatigue and borderline low blood pressures since leaving the hospital but did not report any chest pain or shortness of breath.  On account of fatigue and borderline low BP, isosorbide mononitrate was discontinued at that visit.  Today, Martin Garza reports that he is feeling better with resolution of aforementioned fatigue.  He recently walked 2 miles on the beach without any difficulty.  He denies chest pain, shortness of breath, palpitations, and lightheadedness.  He denies bleeding.  He recently had some indigestion after running out of ranitidine and is requesting a refill.  --------------------------------------------------------------------------------------------------  Past Medical History:  Diagnosis Date  . Allergy   . CAD (coronary artery disease)    a. 10/2018 NSTEMI/PCI: LM nl, LAD 95p (3.0x12 Resolute Onyx DES), 84m, 85d (2.0x18 Resolute Onyx DES), LCX nl, RCA large, 20ost, 30d.  . Cataract    had surg in both eyes  . Complication of anesthesia    Blood pressure goes up after waking up past sedation/ gets dizzy too per pt!  . Essential hypertension   . Hyperlipidemia LDL goal <70   . Ischemic cardiomyopathy    a. 10/2018 Echo: EF 55-60%, sev apical septal, apical, and ant HK. DD. Nl RV fxn.  . Medical history non-contributory   . Retinal detachment 2014   left  . Umbilical hernia    Past Surgical History:  Procedure Laterality Date  .  CATARACT EXTRACTION  2018   Bil  . COLONOSCOPY  04/2018   TA, SSP, diverticulosis, rpt 3 yrs ([facility]); with polypectom y  . CORONARY STENT INTERVENTION N/A 11/10/2018   Procedure: CORONARY STENT INTERVENTION;  Surgeon: Nelva Bush, MD;  Location: [facility];  Service: Cardiovascular;  Laterality: N/A;  . LEFT HEART CATH AND CORONARY ANGIOGRAPHY N/A 11/10/2018   Procedure: LEFT HEART CATH AND CORONARY ANGIOGRAPHY;  Surgeon: Nelva Bush, MD;  Location: [facility];  Service: Cardiovascular;  Laterality: N/A;  . Left inguinal repair  in [employer] School  . RETINAL DETACHMENT SURGERY Left    x 2 in 2014  . UMBILICAL HERNIA REPAIR N/A 05/30/2018   Procedure: LAPAROSCOPIC ASSISTED  UMBILICAL HERNIA REPAIR WITH MESH,  ERAS PATHWAY;  Surgeon: Johnathan Hausen, MD;  Location: [facility];  Service: General;  Laterality: N/A;    Current Meds  Medication Sig  . acetaminophen (TYLENOL) 325 MG tablet Take 650 mg by mouth every 6 (six) hours as needed for moderate pain.   Marland Kitchen aspirin EC 81 MG EC tablet Take 1 tablet (81 mg total) by mouth daily.  Marland Kitchen atorvastatin (LIPITOR) 80 MG tablet Take 1 tablet (80 mg total) by mouth daily at 6 PM.  . Cyanocobalamin (B-12 PO) Take 500 mcg by mouth daily.   . famotidine (PEPCID) 20 MG tablet Take 1 tablet (20 mg total) by mouth daily.  Marland Kitchen lisinopril (ZESTRIL) 5 MG tablet Take 1 tablet (5 mg total) by mouth daily.  . metoprolol tartrate (LOPRESSOR)  25 MG tablet Take 0.5 tablets (12.5 mg total) by mouth 2 (two) times daily.  . nitroGLYCERIN (NITROSTAT) 0.4 MG SL tablet Place 1 tablet (0.4 mg total) under the tongue every 5 (five) minutes x 3 doses as needed for chest pain.  . prasugrel (EFFIENT) 10 MG TABS tablet Take 1 tablet (10 mg total) by mouth daily.  . vitamin C (ASCORBIC ACID) 500 MG tablet Take 1,000 mg by mouth daily.    Allergies: Patient has no known allergies.  Social History   Tobacco Use  . Smoking status: Never Smoker  . Smokeless  tobacco: Former Systems developer    Types: Chew  Substance Use Topics  . Alcohol use: Yes    Alcohol/week: 1.0 standard drinks    Types: 1 Glasses of wine per week    Comment: rare  . Drug use: No    Family History  Problem Relation Age of Onset  . Cancer Mother 42       metastatic cancer, smoker  . Coronary artery disease Father 3       MI with CABG, smoker  . Stroke Father        after catheterization  . Alcohol abuse Father   . Alcohol abuse Brother   . Coronary artery disease Brother 57       CABG, smoker  . Depression Brother   . Drug abuse Brother   . Hearing loss Daughter   . Cancer Maternal Aunt        breast  . Breast cancer Maternal Aunt   . Diabetes Neg Hx   . Colon cancer Neg Hx   . Rectal cancer Neg Hx   . Stomach cancer Neg Hx     Review of Systems: Patient reports enlarging soft tissue nodule on right forehead after hitting that area several months ago (before MI/PCI).  Otherwise, a 12-system review of systems was performed and was negative except as noted in the HPI.  --------------------------------------------------------------------------------------------------  Physical Exam: BP 118/70 (BP Location: Left Arm, Patient Position: Sitting, Cuff Size: Normal)   Pulse (!) 58   Ht 5\' 11"  (1.803 m)   Wt 203 lb 8 oz (92.3 kg)   BMI 28.38 kg/m   General:  NAD HEENT: No conjunctival pallor or scleral icterus. Facemask in place Neck: Supple without lymphadenopathy, thyromegaly, JVD, or HJR. Lungs: Normal work of breathing. Clear to auscultation bilaterally without wheezes or crackles. Heart: Regular rate and rhythm without murmurs, rubs, or gallops. Non-displaced PMI. Abd: Bowel sounds present. Soft, NT/ND without hepatosplenomegaly Ext: No lower extremity edema. Radial, PT, and DP pulses are 2+ bilaterally. Skin: Warm and dry.  Firm soft tissue nodule near right temple measuring ~1.5 cm in diameter.  No discharge or erythema.  EKG:  NSR with left axis deviation  and non-specific ST/T changes.  Nonspecific ST/T changes are more pronounced than prior tracing from 11/19/2018.  Lab Results  Component Value Date   WBC 11.1 (H) 11/12/2018   HGB 13.4 11/12/2018   HCT 38.6 (L) 11/12/2018   MCV 94.1 11/12/2018   PLT 164 11/12/2018    Lab Results  Component Value Date   NA 144 11/19/2018   K 4.5 11/19/2018   CL 106 11/19/2018   CO2 21 11/19/2018   BUN 16 11/19/2018   CREATININE 1.03 11/19/2018   GLUCOSE 96 11/19/2018   ALT 15 02/13/2018    Lab Results  Component Value Date   CHOL 226 (H) 11/10/2018   HDL 44 11/10/2018   LDLCALC 166 (  H) 11/10/2018   LDLDIRECT 160.0 02/13/2018   TRIG 81 11/10/2018   CHOLHDL 5.1 11/10/2018    --------------------------------------------------------------------------------------------------  ASSESSMENT AND PLAN: CAD: No symptoms to suggest worsening coronary insufficiency.  Plan to continue aspirin and prasugrel for 12 months from time of MI in June.  Continue aggressive secondary prevention.  Cardiac rehab recommended, which Martin Garza will consider.  Ischemic cardiomyopathy: Martin Garza appears euvolemic and well-compensated with NYHA class I-II symptoms.  Continue current doses of metoprolol and lisinopril.  Hyperlipidemia: Tolerating high-intensity statin therapy well.  We will check a lipid panel and ALT today to ensure that LDL is at goal (goal < 70).  Hypertension: BP well-controlled.  Continue current doses of metoprolol and lisinopril.  Nelva Bush, MD 12/31/2018 10:19 AM

## 2018-12-31 ENCOUNTER — Other Ambulatory Visit: Payer: Self-pay

## 2018-12-31 ENCOUNTER — Encounter: Payer: Self-pay | Admitting: Internal Medicine

## 2018-12-31 ENCOUNTER — Ambulatory Visit (INDEPENDENT_AMBULATORY_CARE_PROVIDER_SITE_OTHER): Payer: PPO | Admitting: Internal Medicine

## 2018-12-31 VITALS — BP 118/70 | HR 58 | Ht 71.0 in | Wt 203.5 lb

## 2018-12-31 DIAGNOSIS — I2510 Atherosclerotic heart disease of native coronary artery without angina pectoris: Secondary | ICD-10-CM | POA: Insufficient documentation

## 2018-12-31 DIAGNOSIS — E785 Hyperlipidemia, unspecified: Secondary | ICD-10-CM | POA: Diagnosis not present

## 2018-12-31 DIAGNOSIS — I10 Essential (primary) hypertension: Secondary | ICD-10-CM

## 2018-12-31 DIAGNOSIS — I255 Ischemic cardiomyopathy: Secondary | ICD-10-CM

## 2018-12-31 MED ORDER — LISINOPRIL 5 MG PO TABS: 5.0000 mg | ORAL_TABLET | Freq: Every day | ORAL | 3 refills | Status: DC

## 2018-12-31 MED ORDER — ATORVASTATIN CALCIUM 80 MG PO TABS: 80.0000 mg | ORAL_TABLET | Freq: Every day | ORAL | 3 refills | Status: DC

## 2018-12-31 MED ORDER — FAMOTIDINE 20 MG PO TABS: 20.0000 mg | ORAL_TABLET | Freq: Every day | ORAL | 3 refills | Status: DC | PRN

## 2018-12-31 MED ORDER — METOPROLOL TARTRATE 25 MG PO TABS: 12.5000 mg | ORAL_TABLET | Freq: Two times a day (BID) | ORAL | 3 refills | Status: DC

## 2018-12-31 MED ORDER — PRASUGREL HCL 10 MG PO TABS: 10.0000 mg | ORAL_TABLET | Freq: Every day | ORAL | 2 refills | Status: DC

## 2018-12-31 NOTE — Patient Instructions (Signed)
Medication Instructions:  Your physician recommends that you continue on your current medications as directed. Please refer to the Current Medication list given to you today.  If you need a refill on your cardiac medications before your next appointment, please call your pharmacy.   Lab work: Your physician recommends that you return for lab work in: TODAY (fasting lipid, alt).  If you have labs (blood work) drawn today and your tests are completely normal, you will receive your results only by: Marland Kitchen MyChart Message (if you have MyChart) OR . A paper copy in the mail If you have any lab test that is abnormal or we need to change your treatment, we will call you to review the results.  Testing/Procedures: - None ordered.   Follow-Up: At [facility], you and your health needs are our priority.  As part of our continuing mission to provide you with exceptional heart care, we have created designated Provider Care Teams.  These Care Teams include your primary Cardiologist (physician) and Advanced Practice Providers (APPs -  Physician Assistants and Nurse Practitioners) who all work together to provide you with the care you need, when you need it. You will need a follow up appointment in 3 months.  Please call our office 2 months in advance to schedule this appointment.  You may see Nelva Bush, MD or one of the following Advanced Practice Providers on your designated Care Team:   Murray Hodgkins, NP Christell Faith, PA-C . Marrianne Mood, PA-C

## 2019-01-01 LAB — LIPID PANEL
Chol/HDL Ratio: 3 ratio (ref 0.0–5.0)
Cholesterol, Total: 145 mg/dL (ref 100–199)
HDL: 49 mg/dL (ref >39)
LDL Calculated: 78 mg/dL (ref 0–99)
Triglycerides: 92 mg/dL (ref 0–149)
VLDL Cholesterol Cal: 18 mg/dL (ref 5–40)

## 2019-01-01 LAB — ALT: ALT: 25 IU/L (ref 0–44)

## 2019-01-02 ENCOUNTER — Telehealth: Payer: Self-pay

## 2019-01-02 NOTE — Telephone Encounter (Signed)
Call to patient to discuss lab results and POC from Dr. Saunders Revel. Pt verbalized understanding and has no further questions at this time.   Advised pt to call for any further questions or concerns.

## 2019-01-02 NOTE — Telephone Encounter (Signed)
-----   Message from Nelva Bush, MD sent at 01/01/2019 10:54 PM EDT ----- Please let Martin Garza know that his cholesterol his improved significant, with LDL down to 78 (goal < 70).  His ALT remains normal.  I recommend that he continue atorvastatin 80 mg daily and work on lifestyle modifications to help lower his cholesterol further.

## 2019-02-15 ENCOUNTER — Other Ambulatory Visit: Payer: Self-pay | Admitting: Family Medicine

## 2019-02-15 DIAGNOSIS — Z125 Encounter for screening for malignant neoplasm of prostate: Secondary | ICD-10-CM

## 2019-02-15 DIAGNOSIS — E785 Hyperlipidemia, unspecified: Secondary | ICD-10-CM

## 2019-02-16 ENCOUNTER — Other Ambulatory Visit: Payer: PPO

## 2019-02-23 ENCOUNTER — Encounter: Payer: PPO | Admitting: Family Medicine

## 2019-02-23 ENCOUNTER — Ambulatory Visit: Payer: PPO

## 2019-04-06 NOTE — Progress Notes (Addendum)
Follow-up Outpatient Visit Date: 04/08/2019  Primary Care Provider: Ria Bush, MD [place] [address]  Chief Complaint: Follow-up CAD  HPI:  Martin Garza is a 68 y.o. year-old male with history of CAD s/p PCI to proximal LAD in setting of STEMI (complicated by loss of flow in the distal LAD), HTN, HLD, and ischemic cardiomyopathy, who presents for follow-up of CAD and cardiomyopathy.    He was last seen on 12/31/2018, at which time he reports that he was feeling better with resolution of aforementioned fatigue.  He  walked 2 miles on the beach without any difficulty.  He denied chest pain, shortness of breath, palpitations, and lightheadedness.  He denies bleeding.  He had some indigestion after running out of ranitidine and refill was given.  Patient works in a Copywriter, advertising, he is able to walk around in his job without any symptoms of chest pain or shortness of breath.  He has a little nodule on his head which he has had for over a year now.  He is thinking of taking it off.  He also notes easy bruising when he bumps into objects.  He states it does not bother him that much but just wanted to bring it to our attention.  He otherwise feels great.  --------------------------------------------------------------------------------------------------  Past Medical History:  Diagnosis Date  . Allergy   . CAD (coronary artery disease)    a. 10/2018 NSTEMI/PCI: LM nl, LAD 95p (3.0x12 Resolute Onyx DES), 78m, 85d (2.0x18 Resolute Onyx DES), LCX nl, RCA large, 20ost, 30d.  . Cataract    had surg in both eyes  . Complication of anesthesia    Blood pressure goes up after waking up past sedation/ gets dizzy too per pt!  . Essential hypertension   . Hyperlipidemia LDL goal <70   . Ischemic cardiomyopathy    a. 10/2018 Echo: EF 55-60%, sev apical septal, apical, and ant HK. DD. Nl RV fxn.  . Medical history non-contributory   . Retinal detachment 2014   left   . Umbilical hernia    Past Surgical History:  Procedure Laterality Date  . CATARACT EXTRACTION  2018   Bil  . COLONOSCOPY  04/2018   TA, SSP, diverticulosis, rpt 3 yrs (Danis); with polypectom y  . CORONARY STENT INTERVENTION N/A 11/10/2018   Procedure: CORONARY STENT INTERVENTION;  Surgeon: Nelva Bush, MD;  Location: [facility];  Service: Cardiovascular;  Laterality: N/A;  . LEFT HEART CATH AND CORONARY ANGIOGRAPHY N/A 11/10/2018   Procedure: LEFT HEART CATH AND CORONARY ANGIOGRAPHY;  Surgeon: Nelva Bush, MD;  Location: [facility];  Service: Cardiovascular;  Laterality: N/A;  . Left inguinal repair  in [employer] School  . RETINAL DETACHMENT SURGERY Left    x 2 in 2014  . UMBILICAL HERNIA REPAIR N/A 05/30/2018   Procedure: LAPAROSCOPIC ASSISTED  UMBILICAL HERNIA REPAIR WITH MESH,  ERAS PATHWAY;  Surgeon: Johnathan Hausen, MD;  Location: [facility];  Service: General;  Laterality: N/A;    Current Meds  Medication Sig  . acetaminophen (TYLENOL) 325 MG tablet Take 650 mg by mouth every 6 (six) hours as needed for moderate pain.   Marland Kitchen aspirin EC 81 MG EC tablet Take 1 tablet (81 mg total) by mouth daily.  Marland Kitchen atorvastatin (LIPITOR) 80 MG tablet Take 1 tablet (80 mg total) by mouth daily at 6 PM.  . Cyanocobalamin (B-12 PO) Take 500 mcg by mouth daily.   . famotidine (PEPCID) 20 MG  tablet Take 1 tablet (20 mg total) by mouth daily as needed for heartburn or indigestion.  Marland Kitchen lisinopril (ZESTRIL) 5 MG tablet Take 1 tablet (5 mg total) by mouth daily.  . metoprolol tartrate (LOPRESSOR) 25 MG tablet Take 0.5 tablets (12.5 mg total) by mouth 2 (two) times daily.  . nitroGLYCERIN (NITROSTAT) 0.4 MG SL tablet Place 1 tablet (0.4 mg total) under the tongue every 5 (five) minutes x 3 doses as needed for chest pain.  . prasugrel (EFFIENT) 10 MG TABS tablet Take 1 tablet (10 mg total) by mouth daily.  . vitamin C (ASCORBIC ACID) 500 MG tablet Take 1,000 mg by mouth daily.     Allergies: Patient has no known allergies.  Social History   Tobacco Use  . Smoking status: Never Smoker  . Smokeless tobacco: Former Systems developer    Types: Chew  Substance Use Topics  . Alcohol use: Yes    Alcohol/week: 1.0 standard drinks    Types: 1 Glasses of wine per week    Comment: rare  . Drug use: No    Family History  Problem Relation Age of Onset  . Cancer Mother 39       metastatic cancer, smoker  . Coronary artery disease Father 83       MI with CABG, smoker  . Stroke Father        after catheterization  . Alcohol abuse Father   . Alcohol abuse Brother   . Coronary artery disease Brother 79       CABG, smoker  . Depression Brother   . Drug abuse Brother   . Hearing loss Daughter   . Cancer Maternal Aunt        breast  . Breast cancer Maternal Aunt   . Diabetes Neg Hx   . Colon cancer Neg Hx   . Rectal cancer Neg Hx   . Stomach cancer Neg Hx     Review of Systems: Patient reports enlarging soft tissue nodule on right forehead after hitting that area several months ago (before MI/PCI).  Otherwise, a 12-system review of systems was performed and was negative except as noted in the HPI.  --------------------------------------------------------------------------------------------------  Physical Exam: BP 124/62 (BP Location: Left Arm, Patient Position: Sitting, Cuff Size: Normal)   Pulse (!) 53   Ht 5' 11.5" (1.816 m)   Wt 208 lb 8 oz (94.6 kg)   SpO2 98%   BMI 28.67 kg/m   General:  NAD HEENT: No conjunctival pallor or scleral icterus. Facemask in place, swelling noted in R. forehead Neck: Supple without lymphadenopathy, thyromegaly, JVD, or HJR. Lungs: Normal work of breathing. Clear to auscultation bilaterally without wheezes or crackles. Heart: Regular rate and rhythm without murmurs, rubs, or gallops. Non-displaced PMI. Abd: Bowel sounds present. Soft, NT/ND without hepatosplenomegaly Ext: No lower extremity edema. Radial, PT, and DP pulses are 2+  bilaterally. Skin: Warm and dry.  Firm soft tissue nodule near right temple measuring ~1.5 cm in diameter.  No discharge or erythema.  EKG: Sinus bradycardia, heart rate 53, otherwise normal ECG..  Lab Results  Component Value Date   WBC 11.1 (H) 11/12/2018   HGB 13.4 11/12/2018   HCT 38.6 (L) 11/12/2018   MCV 94.1 11/12/2018   PLT 164 11/12/2018    Lab Results  Component Value Date   NA 144 11/19/2018   K 4.5 11/19/2018   CL 106 11/19/2018   CO2 21 11/19/2018   BUN 16 11/19/2018   CREATININE 1.03 11/19/2018  GLUCOSE 96 11/19/2018   ALT 25 12/31/2018    Lab Results  Component Value Date   CHOL 145 12/31/2018   HDL 49 12/31/2018   LDLCALC 78 12/31/2018   LDLDIRECT 160.0 02/13/2018   TRIG 92 12/31/2018   CHOLHDL 3.0 12/31/2018    --------------------------------------------------------------------------------------------------  ASSESSMENT AND PLAN: CAD: No symptoms to suggest worsening coronary insufficiency.  Plan to continue aspirin and prasugrel for 12 months from time of MI in June.  If bruising becomes an issue, may consider switching patient to Plavix.  But for now, I recommend patient to continue on prasugrel.  Continue aggressive secondary prevention.  Cardiac rehab recommended.  If he is to perform excision of possible lipoma/nodule in forehead, can hold antiplatelet 5 to 7 days prior to procedure and restart as soon as possible after.   Ischemic cardiomyopathy: Martin Garza appears euvolemic and well-compensated with NYHA class I-II symptoms.  Continue current doses of metoprolol and lisinopril.  Hyperlipidemia: Tolerating high-intensity statin therapy well.  Last LDL 78.  ALT within normal limits.  Continue Lipitor 80 mg daily  Hypertension: BP well-controlled.  Continue current doses of metoprolol and lisinopril.  Kate Sable, MD 04/08/2019 10:04 AM

## 2019-04-08 ENCOUNTER — Other Ambulatory Visit: Payer: Self-pay

## 2019-04-08 ENCOUNTER — Encounter: Payer: Self-pay | Admitting: Cardiology

## 2019-04-08 ENCOUNTER — Ambulatory Visit (INDEPENDENT_AMBULATORY_CARE_PROVIDER_SITE_OTHER): Payer: PPO | Admitting: Cardiology

## 2019-04-08 VITALS — BP 124/62 | HR 53 | Ht 71.5 in | Wt 208.5 lb

## 2019-04-08 DIAGNOSIS — I10 Essential (primary) hypertension: Secondary | ICD-10-CM

## 2019-04-08 DIAGNOSIS — I2510 Atherosclerotic heart disease of native coronary artery without angina pectoris: Secondary | ICD-10-CM

## 2019-04-08 NOTE — Patient Instructions (Signed)
Medication Instructions:  Your physician recommends that you continue on your current medications as directed. Please refer to the Current Medication list given to you today.  *If you need a refill on your cardiac medications before your next appointment, please call your pharmacy*  Lab Work: none If you have labs (blood work) drawn today and your tests are completely normal, you will receive your results only by: . MyChart Message (if you have MyChart) OR . A paper copy in the mail If you have any lab test that is abnormal or we need to change your treatment, we will call you to review the results.  Testing/Procedures: none  Follow-Up: At [facility], you and your health needs are our priority.  As part of our continuing mission to provide you with exceptional heart care, we have created designated Provider Care Teams.  These Care Teams include your primary Cardiologist (physician) and Advanced Practice Providers (APPs -  Physician Assistants and Nurse Practitioners) who all work together to provide you with the care you need, when you need it.  Your next appointment:   3 month(s)  The format for your next appointment:   In Person  Provider:    You may see Christopher End, MD or one of the following Advanced Practice Providers on your designated Care Team:    Christopher Berge, NP  Ryan Dunn, PA-C  Jacquelyn Visser, PA-C 

## 2019-04-13 ENCOUNTER — Telehealth: Payer: Self-pay | Admitting: Internal Medicine

## 2019-04-13 NOTE — Telephone Encounter (Signed)
Called patient's wife, ok per DPR.  Let her know to contact patient's PCP for dermatology referral. She was appreciative and verbalized understanding.

## 2019-04-13 NOTE — Telephone Encounter (Signed)
Patient wife is calling asking if we can make a referral for pt a dermatologist. States she has discussed this with Dr. Saunders Revel in pt previous appointment.

## 2019-04-24 ENCOUNTER — Other Ambulatory Visit: Payer: Self-pay | Admitting: Internal Medicine

## 2019-04-24 MED ORDER — PRASUGREL HCL 10 MG PO TABS: 10.0000 mg | ORAL_TABLET | Freq: Every day | ORAL | 0 refills | Status: DC

## 2019-04-24 NOTE — Telephone Encounter (Signed)
°*  STAT* If patient is at the pharmacy, call can be transferred to refill team.   1. Which medications need to be refilled? (please list name of each medication and dose if known)  Prasugrel 10 mg po q d   2. Which pharmacy/location (including street and city if local pharmacy) is medication to be sent to? [facility] [address]   3. Do they need a 30 day or 90 day supply? [place]

## 2019-04-24 NOTE — Telephone Encounter (Signed)
Requested Prescriptions   Signed Prescriptions Disp Refills  . prasugrel (EFFIENT) 10 MG TABS tablet 90 tablet 0    Sig: Take 1 tablet (10 mg total) by mouth daily.    Authorizing Provider: Kate Sable    Ordering User: Britt Bottom

## 2019-07-09 ENCOUNTER — Ambulatory Visit: Payer: PPO | Admitting: Internal Medicine

## 2019-07-20 ENCOUNTER — Ambulatory Visit (INDEPENDENT_AMBULATORY_CARE_PROVIDER_SITE_OTHER): Payer: PPO | Admitting: Internal Medicine

## 2019-07-20 ENCOUNTER — Other Ambulatory Visit: Payer: Self-pay

## 2019-07-20 ENCOUNTER — Encounter: Payer: Self-pay | Admitting: Internal Medicine

## 2019-07-20 VITALS — BP 150/88 | HR 64 | Ht 71.0 in | Wt 213.0 lb

## 2019-07-20 DIAGNOSIS — E785 Hyperlipidemia, unspecified: Secondary | ICD-10-CM

## 2019-07-20 DIAGNOSIS — I2510 Atherosclerotic heart disease of native coronary artery without angina pectoris: Secondary | ICD-10-CM

## 2019-07-20 DIAGNOSIS — I10 Essential (primary) hypertension: Secondary | ICD-10-CM

## 2019-07-20 DIAGNOSIS — I255 Ischemic cardiomyopathy: Secondary | ICD-10-CM

## 2019-07-20 NOTE — Progress Notes (Signed)
Follow-up Outpatient Visit Date: 07/20/2019  Primary Care Provider: Ria Bush, MD [place] [address]  Chief Complaint: Follow-up coronary artery disease  HPI:  Martin Garza is a 69 y.o. male with history of coronary artery disease status post PCI to proximal LAD in the setting of NSTEMI (complicated by loss of flow in the distal LAD), hypertension, hyperlipidemia, and ischemic cardiomyopathy, who presents for follow-up of coronary artery disease and cardiomyopathy.  He was last seen in our office by Dr. Garen Lah in 03/2019.  At that time, he was asymptomatic without chest pain or shortness of breath.  Today, Martin Garza reports he has been feeling relatively well.  He notes a few episode of dizziness, quitting 1 during which she got up to use the bathroom in the middle the night.  After walking back to bed, he became very woozy and had to squat.  This was associated with diaphoresis.  He has not passed out.  At other times, the dizziness seems to happen mostly when he turns his head quickly.  Last episode of lightheadedness was about 2 to 3 weeks ago.  Martin Garza has not had any chest pain, shortness of breath, palpitations, or edema.  He is tolerating his medications well.  He has not needed to use sublingual nitroglycerin.  --------------------------------------------------------------------------------------------------  Past Medical History:  Diagnosis Date  . Allergy   . CAD (coronary artery disease)    a. 10/2018 NSTEMI/PCI: LM nl, LAD 95p (3.0x12 Resolute Onyx DES), 56m, 85d (2.0x18 Resolute Onyx DES), LCX nl, RCA large, 20ost, 30d.  . Cataract    had surg in both eyes  . Complication of anesthesia    Blood pressure goes up after waking up past sedation/ gets dizzy too per pt!  . Essential hypertension   . Hyperlipidemia LDL goal <70   . Ischemic cardiomyopathy    a. 10/2018 Echo: EF 55-60%, sev apical septal, apical, and ant HK. DD. Nl RV fxn.    . Medical history non-contributory   . Retinal detachment 2014   left  . Umbilical hernia    Past Surgical History:  Procedure Laterality Date  . CATARACT EXTRACTION  2018   Bil  . COLONOSCOPY  04/2018   TA, SSP, diverticulosis, rpt 3 yrs ([facility]); with polypectom y  . CORONARY STENT INTERVENTION N/A 11/10/2018   Procedure: CORONARY STENT INTERVENTION;  Surgeon: Nelva Bush, MD;  Location: [facility];  Service: Cardiovascular;  Laterality: N/A;  . LEFT HEART CATH AND CORONARY ANGIOGRAPHY N/A 11/10/2018   Procedure: LEFT HEART CATH AND CORONARY ANGIOGRAPHY;  Surgeon: Nelva Bush, MD;  Location: [facility];  Service: Cardiovascular;  Laterality: N/A;  . Left inguinal repair  in [employer]  . RETINAL DETACHMENT SURGERY Left    x 2 in 2014  . UMBILICAL HERNIA REPAIR N/A 05/30/2018   Procedure: LAPAROSCOPIC ASSISTED  UMBILICAL HERNIA REPAIR WITH MESH,  ERAS PATHWAY;  Surgeon: Johnathan Hausen, MD;  Location: [facility];  Service: General;  Laterality: N/A;    Current Meds  Medication Sig  . acetaminophen (TYLENOL) 325 MG tablet Take 650 mg by mouth every 6 (six) hours as needed for moderate pain.   Marland Kitchen aspirin EC 81 MG EC tablet Take 1 tablet (81 mg total) by mouth daily.  Marland Kitchen atorvastatin (LIPITOR) 80 MG tablet Take 1 tablet (80 mg total) by mouth daily at 6 PM.  . Cyanocobalamin (B-12 PO) Take 500 mcg by mouth daily.   . famotidine (  PEPCID) 20 MG tablet Take 1 tablet (20 mg total) by mouth daily as needed for heartburn or indigestion.  Marland Kitchen lisinopril (ZESTRIL) 5 MG tablet Take 1 tablet (5 mg total) by mouth daily.  . metoprolol tartrate (LOPRESSOR) 25 MG tablet Take 0.5 tablets (12.5 mg total) by mouth 2 (two) times daily.  . nitroGLYCERIN (NITROSTAT) 0.4 MG SL tablet Place 1 tablet (0.4 mg total) under the tongue every 5 (five) minutes x 3 doses as needed for chest pain.  . prasugrel (EFFIENT) 10 MG TABS tablet Take 1 tablet (10 mg total) by mouth daily.  . vitamin C  (ASCORBIC ACID) 500 MG tablet Take 1,000 mg by mouth daily.    Allergies: Patient has no known allergies.  Social History   Tobacco Use  . Smoking status: Never Smoker  . Smokeless tobacco: Former Systems developer    Types: Chew  Substance Use Topics  . Alcohol use: Yes    Alcohol/week: 1.0 standard drinks    Types: 1 Glasses of wine per week    Comment: rare  . Drug use: No    Family History  Problem Relation Age of Onset  . Cancer Mother 19       metastatic cancer, smoker  . Coronary artery disease Father 76       MI with CABG, smoker  . Stroke Father        after catheterization  . Alcohol abuse Father   . Alcohol abuse Brother   . Coronary artery disease Brother 58       CABG, smoker  . Depression Brother   . Drug abuse Brother   . Hearing loss Daughter   . Cancer Maternal Aunt        breast  . Breast cancer Maternal Aunt   . Diabetes Neg Hx   . Colon cancer Neg Hx   . Rectal cancer Neg Hx   . Stomach cancer Neg Hx     Review of Systems: A 12-system review of systems was performed and was negative except as noted in the HPI.  --------------------------------------------------------------------------------------------------  Physical Exam: BP (!) 150/88 (BP Location: Left Arm, Patient Position: Sitting, Cuff Size: Normal)   Pulse 64   Ht 5\' 11"  (1.803 m)   Wt 213 lb (96.6 kg)   SpO2 98%   BMI 29.71 kg/m   Repeat BP: 148/90  General: NAD. HEENT: No conjunctival pallor or scleral icterus. Facemask in place. Neck: No JVD or HJR. Lungs: Normal work of breathing. Clear to auscultation bilaterally without wheezes or crackles. Heart: Regular rate and rhythm without murmurs, rubs, or gallops. Non-displaced PMI. Abd: Bowel sounds present. Soft, NT/ND without hepatosplenomegaly Ext: No lower extremity edema. Radial, PT, and DP pulses are 2+ bilaterally.  EKG: Normal sinus rhythm with sinus arrhythmia.  No significant abnormality.  Heart rate has increased from prior  tracing on 04/08/2019.  Otherwise, there has been no significant interval change.  Lab Results  Component Value Date   WBC 11.1 (H) 11/12/2018   HGB 13.4 11/12/2018   HCT 38.6 (L) 11/12/2018   MCV 94.1 11/12/2018   PLT 164 11/12/2018    Lab Results  Component Value Date   NA 144 11/19/2018   K 4.5 11/19/2018   CL 106 11/19/2018   CO2 21 11/19/2018   BUN 16 11/19/2018   CREATININE 1.03 11/19/2018   GLUCOSE 96 11/19/2018   ALT 25 12/31/2018    Lab Results  Component Value Date   CHOL 145 12/31/2018   HDL  49 12/31/2018   [place] 78 12/31/2018   LDLDIRECT 160.0 02/13/2018   TRIG 92 12/31/2018   CHOLHDL 3.0 12/31/2018    --------------------------------------------------------------------------------------------------  ASSESSMENT AND PLAN: Coronary artery disease: Martin Garza has recovered well from his NSTEMI and PCI to the LAD in 10/2018.  He denies recurrent chest pain as well as other symptoms suggestive of recurrent angina.  We will plan to continue 12 months of dual antiplatelet therapy with aspirin and prasugrel.  We will also continue secondary prevention with statin therapy.  Ischemic cardiomyopathy: Martin Garza appears euvolemic and well compensated with NYHA class I symptoms.  Continue low-dose metoprolol and losartan.  Hypertension: Blood pressure is mildly elevated today.  Recent episodes of dizziness/lightheadedness could represent a combination of vertigo and orthostatic lightheadedness.  I wonder if he may have had a vasovagal episode that led to his marked lightheadedness and diaphoresis 2 to 3 weeks ago.  In light of the dizziness, we have agreed to defer medication changes today.  I encouraged Martin Garza to minimize his sodium intake.  Hyperlipidemia: LDL just above goal on last check in 12/2018.  We will recheck a lipid panel and ALT today, continuing atorvastatin 80 mg daily for now for target LDL less than 70.  Follow-up: Return to clinic in 3  months.  Nelva Bush, MD 07/20/2019 1:25 PM

## 2019-07-20 NOTE — Patient Instructions (Signed)
Medication Instructions:  Your physician recommends that you continue on your current medications as directed. Please refer to the Current Medication list given to you today.  *If you need a refill on your cardiac medications before your next appointment, please call your pharmacy*  Lab Work: Your physician recommends that you return for lab work in: TODAY - LIPID, ALT.  If you have labs (blood work) drawn today and your tests are completely normal, you will receive your results only by: Marland Kitchen MyChart Message (if you have MyChart) OR . A paper copy in the mail If you have any lab test that is abnormal or we need to change your treatment, we will call you to review the results.   Testing/Procedures: none  Follow-Up: At [facility], you and your health needs are our priority.  As part of our continuing mission to provide you with exceptional heart care, we have created designated Provider Care Teams.  These Care Teams include your primary Cardiologist (physician) and Advanced Practice Providers (APPs -  Physician Assistants and Nurse Practitioners) who all work together to provide you with the care you need, when you need it.  We recommend signing up for the patient portal called "MyChart".  Sign up information is provided on this After Visit Summary.  MyChart is used to connect with patients for Virtual Visits (Telemedicine).  Patients are able to view lab/test results, encounter notes, upcoming appointments, etc.  Non-urgent messages can be sent to your provider as well.   To learn more about what you can do with MyChart, go to [email or web address].    Your next appointment:   3 month(s)  The format for your next appointment:   In Person  Provider:    You may see Nelva Bush, MD or one of the following Advanced Practice Providers on your designated Care Team:    Murray Hodgkins, NP  Christell Faith, PA-C  Marrianne Mood, PA-C  Other Instructions Please Monitor your blood  pressure and heart rate at home 1-2 times a week. Please let us know if it is consistently greater than 140/90.

## 2019-07-21 LAB — LIPID PANEL
Chol/HDL Ratio: 3.5 ratio (ref 0.0–5.0)
Cholesterol, Total: 154 mg/dL (ref 100–199)
HDL: 44 mg/dL (ref >39)
LDL Chol Calc (NIH): 88 mg/dL (ref 0–99)
Triglycerides: 121 mg/dL (ref 0–149)
VLDL Cholesterol Cal: 22 mg/dL (ref 5–40)

## 2019-07-21 LAB — ALT: ALT: 29 IU/L (ref 0–44)

## 2019-07-22 ENCOUNTER — Telehealth: Payer: Self-pay

## 2019-07-22 DIAGNOSIS — I249 Acute ischemic heart disease, unspecified: Secondary | ICD-10-CM

## 2019-07-22 DIAGNOSIS — E785 Hyperlipidemia, unspecified: Secondary | ICD-10-CM

## 2019-07-22 MED ORDER — EZETIMIBE 10 MG PO TABS: 10.0000 mg | ORAL_TABLET | Freq: Every day | ORAL | 3 refills | Status: DC

## 2019-07-22 NOTE — Telephone Encounter (Signed)
Results released to [facility].   Attempted to reach patient, no answer and VM full.

## 2019-07-22 NOTE — Telephone Encounter (Signed)
-----   Message from Nelva Bush, MD sent at 07/21/2019  8:52 PM EST ----- Please let [name] know that his LDL is above goal.  I recommend that he continue atorvastatin 80 mg daily and add ezetimibe 10 mg daily, for target LDL < 70.  He should have a repeat lipid panel and ALT in ~3 months.

## 2019-07-22 NOTE — Telephone Encounter (Signed)
Patient calling to discuss recent testing results  ° °Please call  ° °

## 2019-07-22 NOTE — Telephone Encounter (Signed)
Spoke with patient regarding results and recommendations. Patient verbalizes understanding. He agrees to start ezetimibe 10 mg daily and to come back to the [facility] in 3 months for ALT/Lipid panel. Pt is aware that he will need to be fasting for these labs.    Encouraged patient to call back with any questions or concerns.

## 2019-07-28 ENCOUNTER — Other Ambulatory Visit: Payer: Self-pay

## 2019-07-28 MED ORDER — PRASUGREL HCL 10 MG PO TABS: 10.0000 mg | ORAL_TABLET | Freq: Every day | ORAL | 0 refills | Status: DC

## 2019-09-14 DIAGNOSIS — X32XXXA Exposure to sunlight, initial encounter: Secondary | ICD-10-CM | POA: Diagnosis not present

## 2019-09-14 DIAGNOSIS — D2261 Melanocytic nevi of right upper limb, including shoulder: Secondary | ICD-10-CM | POA: Diagnosis not present

## 2019-09-14 DIAGNOSIS — D2271 Melanocytic nevi of right lower limb, including hip: Secondary | ICD-10-CM | POA: Diagnosis not present

## 2019-09-14 DIAGNOSIS — D225 Melanocytic nevi of trunk: Secondary | ICD-10-CM | POA: Diagnosis not present

## 2019-09-14 DIAGNOSIS — L728 Other follicular cysts of the skin and subcutaneous tissue: Secondary | ICD-10-CM | POA: Diagnosis not present

## 2019-09-14 DIAGNOSIS — D2262 Melanocytic nevi of left upper limb, including shoulder: Secondary | ICD-10-CM | POA: Diagnosis not present

## 2019-09-14 DIAGNOSIS — L570 Actinic keratosis: Secondary | ICD-10-CM | POA: Diagnosis not present

## 2019-09-14 DIAGNOSIS — L821 Other seborrheic keratosis: Secondary | ICD-10-CM | POA: Diagnosis not present

## 2019-09-14 DIAGNOSIS — D2272 Melanocytic nevi of left lower limb, including hip: Secondary | ICD-10-CM | POA: Diagnosis not present

## 2019-10-14 ENCOUNTER — Other Ambulatory Visit: Payer: Self-pay | Admitting: Internal Medicine

## 2019-10-21 ENCOUNTER — Ambulatory Visit (INDEPENDENT_AMBULATORY_CARE_PROVIDER_SITE_OTHER): Payer: PPO | Admitting: Internal Medicine

## 2019-10-21 ENCOUNTER — Encounter: Payer: Self-pay | Admitting: Internal Medicine

## 2019-10-21 ENCOUNTER — Other Ambulatory Visit: Payer: Self-pay

## 2019-10-21 VITALS — BP 150/90 | HR 51 | Ht 71.0 in | Wt 213.2 lb

## 2019-10-21 DIAGNOSIS — I10 Essential (primary) hypertension: Secondary | ICD-10-CM

## 2019-10-21 DIAGNOSIS — I255 Ischemic cardiomyopathy: Secondary | ICD-10-CM | POA: Diagnosis not present

## 2019-10-21 DIAGNOSIS — Z79899 Other long term (current) drug therapy: Secondary | ICD-10-CM

## 2019-10-21 DIAGNOSIS — E785 Hyperlipidemia, unspecified: Secondary | ICD-10-CM | POA: Diagnosis not present

## 2019-10-21 DIAGNOSIS — I25119 Atherosclerotic heart disease of native coronary artery with unspecified angina pectoris: Secondary | ICD-10-CM | POA: Diagnosis not present

## 2019-10-21 MED ORDER — VALSARTAN 160 MG PO TABS
160.0000 mg | ORAL_TABLET | Freq: Every day | ORAL | 3 refills | Status: DC
Start: 2019-10-21 — End: 2020-01-13

## 2019-10-21 NOTE — Progress Notes (Signed)
Follow-up Outpatient Visit Date: 10/21/2019  Primary Care Provider: Ria Bush, MD [place] [address]  Chief Complaint: Shortness of breath and fatigue with activity  HPI:  Martin Garza is a 69 y.o. male with history of coronary artery disease status post PCI to proximal LAD in the setting of NSTEMI (complicated by loss of flow in the distal LAD), hypertension, hyperlipidemia, and ischemic cardiomyopathy, who presents for follow-up of coronary artery disease and cardiomyopathy.  I last saw Martin Garza in early March, at which time he was feeling well other than occasional episodes of dizziness.  Symptoms are most consistent with a vasovagal episode.  No medication changes or additional testing were pursued at that time.  Today, Martin Garza reports that he has noticed some fatigue and shortness of breath with activity.  This most often develops in the early afternoon when he has been busy in the mornings.  He feels like he sleeps well at night and is refreshed in the mornings.  He has not had any chest pain.  He has put on a few pounds over the last few several months and wonders if this could be contributing to his shortness of breath and fatigue.  He remains compliant with his medications.  He wonders if lisinopril could be contributing to his symptoms.  He also endorses a nonproductive cough.  He has not had any edema, orthopnea, PND, palpitations, lightheadedness, or bleeding.  --------------------------------------------------------------------------------------------------  Past Medical History:  Diagnosis Date  . Allergy   . CAD (coronary artery disease)    a. 10/2018 NSTEMI/PCI: LM nl, LAD 95p (3.0x12 Resolute Onyx DES), 51m, 85d (2.0x18 Resolute Onyx DES), LCX nl, RCA large, 20ost, 30d.  . Cataract    had surg in both eyes  . Complication of anesthesia    Blood pressure goes up after waking up past sedation/ gets dizzy too per pt!  . Essential  hypertension   . Hyperlipidemia LDL goal <70   . Ischemic cardiomyopathy    a. 10/2018 Echo: EF 55-60%, sev apical septal, apical, and ant HK. DD. Nl RV fxn.  . Medical history non-contributory   . Retinal detachment 2014   left  . Umbilical hernia    Past Surgical History:  Procedure Laterality Date  . CARDIAC CATHETERIZATION    . CATARACT EXTRACTION  2018   Bil  . COLONOSCOPY  04/2018   TA, SSP, diverticulosis, rpt 3 yrs ([facility]); with polypectom y  . CORONARY STENT INTERVENTION N/A 11/10/2018   Procedure: CORONARY STENT INTERVENTION;  Surgeon: Nelva Bush, MD;  Location: [facility];  Service: Cardiovascular;  Laterality: N/A;  . LEFT HEART CATH AND CORONARY ANGIOGRAPHY N/A 11/10/2018   Procedure: LEFT HEART CATH AND CORONARY ANGIOGRAPHY;  Surgeon: Nelva Bush, MD;  Location: [facility];  Service: Cardiovascular;  Laterality: N/A;  . Left inguinal repair  in [employer]  . RETINAL DETACHMENT SURGERY Left    x 2 in 2014  . UMBILICAL HERNIA REPAIR N/A 05/30/2018   Procedure: LAPAROSCOPIC ASSISTED  UMBILICAL HERNIA REPAIR WITH MESH,  ERAS PATHWAY;  Surgeon: Johnathan Hausen, MD;  Location: [facility];  Service: General;  Laterality: N/A;   Current Meds  Medication Sig  . acetaminophen (TYLENOL) 325 MG tablet Take 650 mg by mouth every 6 (six) hours as needed for moderate pain.   Marland Kitchen aspirin EC 81 MG EC tablet Take 1 tablet (81 mg total) by mouth daily.  Marland Kitchen atorvastatin (LIPITOR) 80 MG tablet Take  1 tablet (80 mg total) by mouth daily at 6 PM.  . Cyanocobalamin (B-12 PO) Take 500 mcg by mouth daily.   Marland Kitchen ezetimibe (ZETIA) 10 MG tablet TAKE 1 TABLET BY MOUTH EVERY DAY  . famotidine (PEPCID) 20 MG tablet Take 1 tablet (20 mg total) by mouth daily as needed for heartburn or indigestion.  . nitroGLYCERIN (NITROSTAT) 0.4 MG SL tablet Place 1 tablet (0.4 mg total) under the tongue every 5 (five) minutes x 3 doses as needed for chest pain.  . prasugrel (EFFIENT) 10 MG TABS  tablet Take 1 tablet (10 mg total) by mouth daily.  . vitamin C (ASCORBIC ACID) 500 MG tablet Take 1,000 mg by mouth daily.  . [DISCONTINUED] lisinopril (ZESTRIL) 5 MG tablet Take 1 tablet (5 mg total) by mouth daily.  . [DISCONTINUED] metoprolol tartrate (LOPRESSOR) 25 MG tablet Take 0.5 tablets (12.5 mg total) by mouth 2 (two) times daily.    Allergies: Patient has no known allergies.  Social History   Tobacco Use  . Smoking status: Never Smoker  . Smokeless tobacco: Former Systems developer    Types: Chew  Substance Use Topics  . Alcohol use: Not Currently    Alcohol/week: 1.0 standard drinks    Types: 1 Glasses of wine per week    Comment: rare  . Drug use: No    Family History  Problem Relation Age of Onset  . Cancer Mother 61       metastatic cancer, smoker  . Coronary artery disease Father 68       MI with CABG, smoker  . Stroke Father        after catheterization  . Alcohol abuse Father   . Alcohol abuse Brother   . Coronary artery disease Brother 5       CABG, smoker  . Depression Brother   . Drug abuse Brother   . Hearing loss Daughter   . Cancer Maternal Aunt        breast  . Breast cancer Maternal Aunt   . Diabetes Neg Hx   . Colon cancer Neg Hx   . Rectal cancer Neg Hx   . Stomach cancer Neg Hx     Review of Systems: A 12-system review of systems was performed and was negative except as noted in the HPI.  --------------------------------------------------------------------------------------------------  Physical Exam: BP (!) 150/90 (BP Location: Left Arm, Patient Position: Sitting, Cuff Size: Normal)   Pulse (!) 51   Ht 5\' 11"  (1.803 m)   Wt 213 lb 4 oz (96.7 kg)   SpO2 98%   BMI 29.74 kg/m   General: NAD. Neck: No JVD or HJR. Lungs: Clear to auscultation bilaterally that wheezes or crackles. Heart: Bradycardic but regular without murmurs, rubs, or gallops. Abdomen: Soft, nontender, nondistended without hepatosplenomegaly. Extremities: 2+ radial  pulses bilaterally.  No lower extremity edema.  EKG: Sinus bradycardia with sinus arrhythmia (heart rate 51 bpm).  Otherwise, no significant abnormality.  Lab Results  Component Value Date   WBC 11.1 (H) 11/12/2018   HGB 13.4 11/12/2018   HCT 38.6 (L) 11/12/2018   MCV 94.1 11/12/2018   PLT 164 11/12/2018    Lab Results  Component Value Date   NA 144 11/19/2018   K 4.5 11/19/2018   CL 106 11/19/2018   CO2 21 11/19/2018   BUN 16 11/19/2018   CREATININE 1.03 11/19/2018   GLUCOSE 96 11/19/2018   ALT 29 07/20/2019    Lab Results  Component Value Date   CHOL  154 07/20/2019   HDL 44 07/20/2019   LDLCALC 88 07/20/2019   LDLDIRECT 160.0 02/13/2018   TRIG 121 07/20/2019   CHOLHDL 3.5 07/20/2019    --------------------------------------------------------------------------------------------------  ASSESSMENT AND PLAN: Coronary artery disease and dyspnea on exertion: Martin Garza denies frank angina but has had some increasing fatigue and exertional dyspnea.  He wonders if this could be an anginal equivalent for him, as he did not have significant chest pain until time of catheterization with his NSTEMI.  EKG today is notable for sinus bradycardia.  Excess beta-blockade may be contributing to some of his symptoms.  We have therefore agreed to discontinue metoprolol.  We will also stop lisinopril in light of his nonproductive cough and add valsartan 160 mg daily instead.  I will check CBC, CMP, and TSH today for further evaluation of his symptoms as well.  If his symptoms worsen or do not improve with the aforementioned medication changes, I would favor performing an exercise tolerance test to exclude worsening coronary insufficiency.  We will plan to continue dual antiplatelet therapy until aforementioned intervention/work-up have been completed.  Ischemic cardiomyopathy: Martin Garza appears euvolemic.  It is conceivable that some of his aforementioned symptoms could reflect worsening  cardiomyopathy, though EF was preserved with only a focal apical wall motion abnormality at the time of his MI.  We will make medication changes, as outlined above, before proceeding with additional imaging.  Hypertension: Blood pressure suboptimally controlled today.  As above, we will discontinue lisinopril and metoprolol.  Valsartan 160 mg daily will be added.  I will check labs today, as outlined above, as well as a BMP in 2 weeks to ensure stable renal function and electrolytes.  Hyperlipidemia: We will check a fasting lipid panel today to ensure appropriate response to atorvastatin and ezetimibe.  Target LDL is less than 70.  Follow-up: Return to clinic in 1 month.  Nelva Bush, MD 10/21/2019 12:58 PM

## 2019-10-21 NOTE — Patient Instructions (Signed)
Medication Instructions:  Your physician has recommended you make the following change in your medication:  1- STOP Lisinopril. 2- STOP Metoprolol. 3- START Valsartan 160 mg by mouth once a day.  *If you need a refill on your cardiac medications before your next appointment, please call your pharmacy*   Lab Work: 1- Your physician recommends that you return for lab work in: [place], CBC, LIPID, TSH.  2- Your physician recommends that you return for lab work in: [place]  ~ 11-04-2019 TO [place] [facility]. - Please go to the [facility]. You will check in at the front desk to the right as you walk into the atrium. Valet Parking is offered if needed. - No appointment needed. You may go any day between 7 am and 6 pm.  If you have labs (blood work) drawn today and your tests are completely normal, you will receive your results only by: Marland Kitchen MyChart Message (if you have MyChart) OR . A paper copy in the mail If you have any lab test that is abnormal or we need to change your treatment, we will call you to review the results.   Testing/Procedures: none   Follow-Up: At [facility], you and your health needs are our priority.  As part of our continuing mission to provide you with exceptional heart care, we have created designated Provider Care Teams.  These Care Teams include your primary Cardiologist (physician) and Advanced Practice Providers (APPs -  Physician Assistants and Nurse Practitioners) who all work together to provide you with the care you need, when you need it.  We recommend signing up for the patient portal called "MyChart".  Sign up information is provided on this After Visit Summary.  MyChart is used to connect with patients for Virtual Visits (Telemedicine).  Patients are able to view lab/test results, encounter notes, upcoming appointments, etc.  Non-urgent messages can be sent to your provider as well.   To learn more about what you can do with MyChart, go to  [email or web address].    Your next appointment:   1 month(s) WITH AN APP.  The format for your next appointment:   In Person  Provider:    You may see one of the following Advanced Practice Providers on your designated Care Team:    Murray Hodgkins, NP  Christell Faith, PA-C  Marrianne Mood, PA-C

## 2019-10-22 LAB — COMPREHENSIVE METABOLIC PANEL
ALT: 25 IU/L (ref 0–44)
AST: 18 IU/L (ref 0–40)
Albumin/Globulin Ratio: 2 (ref 1.2–2.2)
Albumin: 4.4 g/dL (ref 3.8–4.8)
Alkaline Phosphatase: 66 IU/L (ref 48–121)
BUN/Creatinine Ratio: 13 (ref 10–24)
BUN: 12 mg/dL (ref 8–27)
Bilirubin Total: 0.7 mg/dL (ref 0.0–1.2)
CO2: 22 mmol/L (ref 20–29)
Calcium: 9.1 mg/dL (ref 8.6–10.2)
Chloride: 106 mmol/L (ref 96–106)
Creatinine, Ser: 0.96 mg/dL (ref 0.76–1.27)
GFR calc Af Amer: 92 mL/min/{1.73_m2} (ref >59)
GFR calc non Af Amer: 80 mL/min/{1.73_m2} (ref >59)
Globulin, Total: 2.2 g/dL (ref 1.5–4.5)
Glucose: 93 mg/dL (ref 65–99)
Potassium: 4.4 mmol/L (ref 3.5–5.2)
Sodium: 141 mmol/L (ref 134–144)
Total Protein: 6.6 g/dL (ref 6.0–8.5)

## 2019-10-22 LAB — CBC WITH DIFFERENTIAL/PLATELET
Basophils Absolute: 0 10*3/uL (ref 0.0–0.2)
Basos: 1 %
EOS (ABSOLUTE): 0.2 10*3/uL (ref 0.0–0.4)
Eos: 3 %
Hematocrit: 41.2 % (ref 37.5–51.0)
Hemoglobin: 14.2 g/dL (ref 13.0–17.7)
Immature Grans (Abs): 0 10*3/uL (ref 0.0–0.1)
Immature Granulocytes: 0 %
Lymphocytes Absolute: 1.5 10*3/uL (ref 0.7–3.1)
Lymphs: 20 %
MCH: 33.6 pg — ABNORMAL HIGH (ref 26.6–33.0)
MCHC: 34.5 g/dL (ref 31.5–35.7)
MCV: 97 fL (ref 79–97)
Monocytes Absolute: 0.9 10*3/uL (ref 0.1–0.9)
Monocytes: 13 %
Neutrophils Absolute: 4.6 10*3/uL (ref 1.4–7.0)
Neutrophils: 63 %
Platelets: 192 10*3/uL (ref 150–450)
RBC: 4.23 x10E6/uL (ref 4.14–5.80)
RDW: 12.3 % (ref 11.6–15.4)
WBC: 7.3 10*3/uL (ref 3.4–10.8)

## 2019-10-22 LAB — LIPID PANEL
Chol/HDL Ratio: 2.8 ratio (ref 0.0–5.0)
Cholesterol, Total: 141 mg/dL (ref 100–199)
HDL: 51 mg/dL (ref >39)
LDL Chol Calc (NIH): 75 mg/dL (ref 0–99)
Triglycerides: 75 mg/dL (ref 0–149)
VLDL Cholesterol Cal: 15 mg/dL (ref 5–40)

## 2019-10-22 LAB — TSH: TSH: 1.45 u[IU]/mL (ref 0.450–4.500)

## 2019-10-23 ENCOUNTER — Telehealth: Payer: Self-pay | Admitting: *Deleted

## 2019-10-23 DIAGNOSIS — Z79899 Other long term (current) drug therapy: Secondary | ICD-10-CM

## 2019-10-23 DIAGNOSIS — E785 Hyperlipidemia, unspecified: Secondary | ICD-10-CM

## 2019-10-23 MED ORDER — ROSUVASTATIN CALCIUM 40 MG PO TABS
40.0000 mg | ORAL_TABLET | Freq: Every day | ORAL | 2 refills | Status: DC
Start: 2019-10-23 — End: 2020-03-03

## 2019-10-23 NOTE — Telephone Encounter (Signed)
Results called to pt. Pt verbalized understanding of results, to stop atorvastatin, start rosuvastatin 40 mg daily, continue other meds, recheck lab work in 3 months. Aware to go to the [facility] in about 3 months ~ 01/23/20 and to be fasting for the cholesterol panel. Lab orders entered. Rx sent to pharmacy.

## 2019-10-23 NOTE — Telephone Encounter (Signed)
-----   Message from Nelva Bush, MD sent at 10/22/2019 12:59 PM EDT ----- Please let Martin Garza know that no significant abnormalities are noted on his blood counts, kidney function, liver function, and thyroid function.  His LDL is just slightly above goal at 75.  I suggest that we try switching atorvastatin to rosuvastatin 40 mg daily to see if we can get his LDL below 70.  He should otherwise continue his current medications.  We should recheck a fasting lipid panel and ALT in about 3 months.

## 2019-10-26 ENCOUNTER — Other Ambulatory Visit: Payer: Self-pay

## 2019-10-26 MED ORDER — PRASUGREL HCL 10 MG PO TABS: 10.0000 mg | ORAL_TABLET | Freq: Every day | ORAL | 0 refills | Status: DC

## 2019-11-10 ENCOUNTER — Other Ambulatory Visit
Admission: RE | Admit: 2019-11-10 | Discharge: 2019-11-10 | Disposition: A | Discharge status: Home / Self Care | Payer: PPO | Attending: Internal Medicine | Admitting: Internal Medicine

## 2019-11-10 ENCOUNTER — Other Ambulatory Visit: Payer: Self-pay

## 2019-11-10 DIAGNOSIS — Z79899 Other long term (current) drug therapy: Secondary | ICD-10-CM | POA: Insufficient documentation

## 2019-11-10 DIAGNOSIS — I10 Essential (primary) hypertension: Secondary | ICD-10-CM | POA: Insufficient documentation

## 2019-11-10 LAB — BASIC METABOLIC PANEL
Anion gap: 7 (ref 5–15)
BUN: 17 mg/dL (ref 8–23)
CO2: 25 mmol/L (ref 22–32)
Calcium: 8.7 mg/dL — ABNORMAL LOW (ref 8.9–10.3)
Chloride: 108 mmol/L (ref 98–111)
Creatinine, Ser: 1.17 mg/dL (ref 0.61–1.24)
GFR calc Af Amer: >60 mL/min (ref >60)
GFR calc non Af Amer: >60 mL/min (ref >60)
Glucose, Bld: 115 mg/dL — ABNORMAL HIGH (ref 70–99)
Potassium: 3.8 mmol/L (ref 3.5–5.1)
Sodium: 140 mmol/L (ref 135–145)

## 2019-11-24 NOTE — Progress Notes (Signed)
Office Visit    Patient Name: Martin Garza Date of Encounter: 11/25/2019  Primary Care Provider:  Ria Bush, MD Primary Cardiologist:  Nelva Bush, MD Electrophysiologist:  None   Chief Complaint    Martin Garza is a 69 y.o. male with a hx of CAD s/p PCI to proximal LAD in the setting of NSTEMI (complicated by loss of flow to the distal LAD), HTN, HLD, ICM presents today for follow up after medication changes.   Past Medical History    Past Medical History:  Diagnosis Date  . Allergy   . CAD (coronary artery disease)    a. 10/2018 NSTEMI/PCI: LM nl, LAD 95p (3.0x12 Resolute Onyx DES), 46m, 85d (2.0x18 Resolute Onyx DES), LCX nl, RCA large, 20ost, 30d.  . Cataract    had surg in both eyes  . Complication of anesthesia    Blood pressure goes up after waking up past sedation/ gets dizzy too per pt!  . Essential hypertension   . Hyperlipidemia LDL goal <70   . Ischemic cardiomyopathy    a. 10/2018 Echo: EF 55-60%, sev apical septal, apical, and ant HK. DD. Nl RV fxn.  . Medical history non-contributory   . Retinal detachment 2014   left  . Umbilical hernia    Past Surgical History:  Procedure Laterality Date  . CARDIAC CATHETERIZATION    . CATARACT EXTRACTION  2018   Bil  . COLONOSCOPY  04/2018   TA, SSP, diverticulosis, rpt 3 yrs ([facility]); with polypectom y  . CORONARY STENT INTERVENTION N/A 11/10/2018   Procedure: CORONARY STENT INTERVENTION;  Surgeon: Nelva Bush, MD;  Location: [place] [facility];  Service: Cardiovascular;  Laterality: N/A;  . LEFT HEART CATH AND CORONARY ANGIOGRAPHY N/A 11/10/2018   Procedure: LEFT HEART CATH AND CORONARY ANGIOGRAPHY;  Surgeon: Nelva Bush, MD;  Location: [place] [facility];  Service: Cardiovascular;  Laterality: N/A;  . Left inguinal repair  in [employer]  . RETINAL DETACHMENT SURGERY Left    x 2 in 2014  . UMBILICAL HERNIA REPAIR N/A 05/30/2018   Procedure: LAPAROSCOPIC ASSISTED  UMBILICAL HERNIA  REPAIR WITH MESH,  ERAS PATHWAY;  Surgeon: Johnathan Hausen, MD;  Location: [facility];  Service: General;  Laterality: N/A;    Allergies  Allergies  Allergen Reactions  . Lisinopril Cough    History of Present Illness    Martin Garza is a 69 y.o. male with a hx of CAD s/p PCI to proximal LAD in the setting of NSTEMI (complicated by loss of flow to the distal LAD), HTN, HLD, ICM.  He was last seen 10/21/2019 by Dr. Saunders Revel.  Seen March 2021 with occasional episodes of dizziness consistent with vasovagal episode.  No medication changes or additional testing at that time.    Seen in clinic 10/21/2019 noting fatigue and shortness of breath with activity.  EKG was notable for sinus bradycardia.  His metoprolol was discontinued.  Lisinopril was stopped due to nonproductive cough and valsartan started 160 mg daily.  Labs that day included normal thyroid function, CBC with no evidence of anemia or infection, normal renal/liver function.  His LDL was mildly elevated at 75 and his atorvastatin was transitioned to rosuvastatin.  BP when checked at home intermittently was SBP 128. He reports his cough has resolved completely. Reports marked improvement in his fatigue and exercise tolerance since medication changes.  Reports resolution in his DOE.  Gained 13 lb since January.  Shares with me that he lost 7 pounds  with feeling better over the last month however, he has regained 3 pounds as he spent the weekend at the beach with his sister.  EKGs/Labs/Other Studies Reviewed:   The following studies were reviewed today:  EKG:  EKG is ordered today.  The ekg ordered today demonstrates SB 57 bpm with sinus arrhythmia and no acute ST/T wave changes.   Recent Labs: 10/21/2019: ALT 25; Hemoglobin 14.2; Platelets 192; TSH 1.450 11/10/2019: BUN 17; Creatinine, Ser 1.17; Potassium 3.8; Sodium 140  Recent Lipid Panel    Component Value Date/Time   CHOL 141 10/21/2019 0846   TRIG 75 10/21/2019 0846   HDL 51  10/21/2019 0846   CHOLHDL 2.8 10/21/2019 0846   CHOLHDL 5.1 11/10/2018 0256   VLDL 16 11/10/2018 0256   LDLCALC 75 10/21/2019 0846   LDLDIRECT 160.0 02/13/2018 1034    Home Medications   Current Meds  Medication Sig  . acetaminophen (TYLENOL) 325 MG tablet Take 650 mg by mouth every 6 (six) hours as needed for moderate pain.   Marland Kitchen aspirin EC 81 MG EC tablet Take 1 tablet (81 mg total) by mouth daily.  . Cyanocobalamin (B-12 PO) Take 500 mcg by mouth daily.   Marland Kitchen ezetimibe (ZETIA) 10 MG tablet TAKE 1 TABLET BY MOUTH EVERY DAY  . famotidine (PEPCID) 20 MG tablet Take 1 tablet (20 mg total) by mouth daily as needed for heartburn or indigestion.  . nitroGLYCERIN (NITROSTAT) 0.4 MG SL tablet Place 1 tablet (0.4 mg total) under the tongue every 5 (five) minutes x 3 doses as needed for chest pain.  . prasugrel (EFFIENT) 10 MG TABS tablet Take 1 tablet (10 mg total) by mouth daily.  . rosuvastatin (CRESTOR) 40 MG tablet Take 1 tablet (40 mg total) by mouth daily.  . valsartan (DIOVAN) 160 MG tablet Take 1 tablet (160 mg total) by mouth daily.  . vitamin C (ASCORBIC ACID) 500 MG tablet Take 1,000 mg by mouth daily.    Review of Systems      Review of Systems  Constitutional: Negative for chills, fever and malaise/fatigue.  Cardiovascular: Negative for chest pain, dyspnea on exertion, leg swelling, near-syncope, orthopnea, palpitations and syncope.  Respiratory: Negative for cough, shortness of breath and wheezing.   Gastrointestinal: Negative for nausea and vomiting.  Neurological: Negative for dizziness, light-headedness and weakness.   All other systems reviewed and are otherwise negative except as noted above.  Physical Exam    VS:  BP 132/76 (BP Location: Left Arm, Patient Position: Sitting, Cuff Size: Normal)   Pulse (!) 57   Ht 5\' 11"  (1.803 m)   Wt 211 lb 6 oz (95.9 kg)   SpO2 98%   BMI 29.48 kg/m  , BMI Body mass index is 29.48 kg/m. GEN: Well nourished, well developed, in no  acute distress. HEENT: normal. Neck: Supple, no JVD, carotid bruits, or masses. Cardiac: RRR, no murmurs, rubs, or gallops. No clubbing, cyanosis, edema.  Radials/DP/PT 2+ and equal bilaterally.  Respiratory:  Respirations regular and unlabored, clear to auscultation bilaterally. GI: Soft, nontender, nondistended, BS + x 4. MS: No deformity or atrophy. Skin: Warm and dry, no rash. Neuro:  Strength and sensation are intact. Psych: Normal affect.  Assessment & Plan    1. CAD -no anginal symptoms.  Reports resolution of DOE since medication changes.  No indication for ischemic evaluation at this time.  GDMT includes aspirin, Effient, rosuvastatin.  He will finish his current supply of Effient and can consider discontinuation of DAPT at  next follow-up appointment.  No beta-blocker secondary to previous exercise intolerance, fatigue, bradycardia.  2. Ischemic cardiomyopathy -euvolemic and well compensated on exam.  GDMT includes aspirin, statin.  No beta-blocker secondary to previous exercise intolerance and bradycardia.  3. HTN -BP well controlled.  Continue with antihypertensive regimen of valsartan 160 mg daily.  4. HLD, LDL goal less than 70 -recent transition from atorvastatin to rosuvastatin due to LDL of 75.  Continue Zetia 10 mg daily.  Plan for repeat lipid/ALT in early September.  Disposition: Follow up in 2-3 month(s) with Dr. Saunders Revel or APP  Loel Dubonnet, NP 11/25/2019, 9:10 AM

## 2019-11-25 ENCOUNTER — Encounter: Payer: Self-pay | Admitting: Family

## 2019-11-25 ENCOUNTER — Ambulatory Visit (INDEPENDENT_AMBULATORY_CARE_PROVIDER_SITE_OTHER): Payer: PPO | Admitting: Family

## 2019-11-25 ENCOUNTER — Other Ambulatory Visit: Payer: Self-pay

## 2019-11-25 VITALS — BP 132/76 | HR 57 | Ht 71.0 in | Wt 211.4 lb

## 2019-11-25 DIAGNOSIS — I25118 Atherosclerotic heart disease of native coronary artery with other forms of angina pectoris: Secondary | ICD-10-CM

## 2019-11-25 DIAGNOSIS — I255 Ischemic cardiomyopathy: Secondary | ICD-10-CM | POA: Diagnosis not present

## 2019-11-25 DIAGNOSIS — E785 Hyperlipidemia, unspecified: Secondary | ICD-10-CM | POA: Diagnosis not present

## 2019-11-25 DIAGNOSIS — I10 Essential (primary) hypertension: Secondary | ICD-10-CM | POA: Diagnosis not present

## 2019-11-25 NOTE — Patient Instructions (Addendum)
Medication Instructions:  No medication changes today.   *If you need a refill on your cardiac medications before your next appointment, please call your pharmacy*  Lab Work: Your physician recommends that you return for lab work early September for lipid panel  Please be fasting for these labs. You may stop by [facility] to have these done.   If you have labs (blood work) drawn today and your tests are completely normal, you will receive your results only by: Marland Kitchen MyChart Message (if you have MyChart) OR . A paper copy in the mail If you have any lab test that is abnormal or we need to change your treatment, we will call you to review the results.  Testing/Procedures: Your EKG today shows sinus bradycardia at a rate of 57 beats per minute.    Follow-Up: At [facility], you and your health needs are our priority.  As part of our continuing mission to provide you with exceptional heart care, we have created designated Provider Care Teams.  These Care Teams include your primary Cardiologist (physician) and Advanced Practice Providers (APPs -  Physician Assistants and Nurse Practitioners) who all work together to provide you with the care you need, when you need it.  We recommend signing up for the patient portal called "MyChart".  Sign up information is provided on this After Visit Summary.  MyChart is used to connect with patients for Virtual Visits (Telemedicine).  Patients are able to view lab/test results, encounter notes, upcoming appointments, etc.  Non-urgent messages can be sent to your provider as well.   To learn more about what you can do with MyChart, go to [email or web address].    Your next appointment:   2-3 month(s)  The format for your next appointment:   In Person  Provider:   You may see Nelva Bush, MD or one of the following Advanced Practice Providers on your designated Care Team:    Murray Hodgkins, NP  Christell Faith, PA-C  Marrianne Mood,  PA-C  Laurann Montana, NP

## 2019-12-24 ENCOUNTER — Other Ambulatory Visit: Payer: Self-pay | Admitting: Internal Medicine

## 2019-12-28 ENCOUNTER — Other Ambulatory Visit: Payer: Self-pay | Admitting: Internal Medicine

## 2019-12-30 ENCOUNTER — Ambulatory Visit: Admission: RE | Admit: 2019-12-30 | Payer: PPO | Source: Home / Self Care | Admitting: Internal Medicine

## 2019-12-30 ENCOUNTER — Other Ambulatory Visit
Admission: RE | Admit: 2019-12-30 | Discharge: 2019-12-30 | Disposition: A | Discharge status: Home / Self Care | Payer: PPO | Attending: Internal Medicine | Admitting: Internal Medicine

## 2019-12-30 DIAGNOSIS — E785 Hyperlipidemia, unspecified: Secondary | ICD-10-CM | POA: Insufficient documentation

## 2019-12-30 DIAGNOSIS — Z79899 Other long term (current) drug therapy: Secondary | ICD-10-CM | POA: Diagnosis not present

## 2019-12-30 LAB — LIPID PANEL
Cholesterol: 116 mg/dL (ref 0–200)
HDL: 49 mg/dL (ref >40)
LDL Cholesterol: 59 mg/dL (ref 0–99)
Total CHOL/HDL Ratio: 2.4 RATIO
Triglycerides: 38 mg/dL (ref <150)
VLDL: 8 mg/dL (ref 0–40)

## 2019-12-30 LAB — ALT: ALT: 42 U/L (ref 0–44)

## 2020-01-04 ENCOUNTER — Telehealth: Payer: Self-pay | Admitting: Internal Medicine

## 2020-01-04 MED ORDER — EZETIMIBE 10 MG PO TABS: 10.0000 mg | ORAL_TABLET | Freq: Every day | ORAL | 2 refills | Status: DC

## 2020-01-04 NOTE — Telephone Encounter (Signed)
Patient called back. He verbalized understanding to continue Zetia. He said he needed a refill for the Zetia. Rx sent.

## 2020-01-04 NOTE — Telephone Encounter (Signed)
VM is full and cannot accept messages at this time.

## 2020-01-04 NOTE — Telephone Encounter (Signed)
Please call to discuss if patient should still continue Zetia. States his cholesterol has came down.

## 2020-01-13 ENCOUNTER — Other Ambulatory Visit: Payer: Self-pay | Admitting: Internal Medicine

## 2020-01-22 ENCOUNTER — Other Ambulatory Visit: Payer: Self-pay | Admitting: Internal Medicine

## 2020-02-25 ENCOUNTER — Ambulatory Visit: Payer: PPO | Admitting: Family

## 2020-03-02 NOTE — Progress Notes (Signed)
Office Visit    Patient Name: ELIAS DENNINGTON Date of Encounter: 03/02/2020  Primary Care Provider:  Ria Bush, MD Primary Cardiologist:  Nelva Bush, MD Electrophysiologist:  None   Chief Complaint    ALY HAUSER is a 69 y.o. male with a hx of CAD s/p PCI to proximal LAD in the setting of NSTEMI (complicated by loss of flow to the distal LAD), HTN, HLD, ICM presents today for follow up of CAD  Past Medical History    Past Medical History:  Diagnosis Date  . Allergy   . CAD (coronary artery disease)    a. 10/2018 NSTEMI/PCI: LM nl, LAD 95p (3.0x12 Resolute Onyx DES), 54m, 85d (2.0x18 Resolute Onyx DES), LCX nl, RCA large, 20ost, 30d.  . Cataract    had surg in both eyes  . Complication of anesthesia    Blood pressure goes up after waking up past sedation/ gets dizzy too per pt!  . Essential hypertension   . Hyperlipidemia LDL goal <70   . Ischemic cardiomyopathy    a. 10/2018 Echo: EF 55-60%, sev apical septal, apical, and ant HK. DD. Nl RV fxn.  . Medical history non-contributory   . Retinal detachment 2014   left  . Umbilical hernia    Past Surgical History:  Procedure Laterality Date  . CARDIAC CATHETERIZATION    . CATARACT EXTRACTION  2018   Bil  . COLONOSCOPY  04/2018   TA, SSP, diverticulosis, rpt 3 yrs ([facility]); with polypectom y  . CORONARY STENT INTERVENTION N/A 11/10/2018   Procedure: CORONARY STENT INTERVENTION;  Surgeon: Nelva Bush, MD;  Location: [place] [facility];  Service: Cardiovascular;  Laterality: N/A;  . LEFT HEART CATH AND CORONARY ANGIOGRAPHY N/A 11/10/2018   Procedure: LEFT HEART CATH AND CORONARY ANGIOGRAPHY;  Surgeon: Nelva Bush, MD;  Location: [place] [facility];  Service: Cardiovascular;  Laterality: N/A;  . Left inguinal repair  in [employer] School  . RETINAL DETACHMENT SURGERY Left    x 2 in 2014  . UMBILICAL HERNIA REPAIR N/A 05/30/2018   Procedure: LAPAROSCOPIC ASSISTED  UMBILICAL HERNIA REPAIR WITH MESH,  ERAS  PATHWAY;  Surgeon: Johnathan Hausen, MD;  Location: [facility];  Service: General;  Laterality: N/A;    Allergies  Allergies  Allergen Reactions  . Lisinopril Cough    History of Present Illness    MYCAL CONDE is a 69 y.o. male with a hx of CAD s/p PCI to proximal LAD in the setting of NSTEMI (complicated by loss of flow to the distal LAD), HTN, HLD, ICM.  He was last seen 11/25/19.  Seen March 2021 with occasional episodes of dizziness consistent with vasovagal episode.  No medication changes or additional testing at that time.    Seen in clinic 10/21/2019 noting fatigue and shortness of breath with activity.  EKG was notable for sinus bradycardia.  His metoprolol was discontinued.  Lisinopril was stopped due to nonproductive cough and valsartan started 160 mg daily.  Labs that day included normal thyroid function, CBC with no evidence of anemia or infection, normal renal/liver function.  His LDL was mildly elevated at 75 and his atorvastatin was transitioned to rosuvastatin.  He was seen in follow-up 11/25/2019.  His cough had resolved completely since transition from lisinopril to valsartan.  His fatigue and exercise tolerance were markedly improved and dyspnea completely resolved.  Recently got back from a [place] trip [place] to [place].  Very much enjoyed the time spent with  his wife.  He continues to work.  He has another trip upcoming to hopefully visit his grandchildren near [place] week.  Reports feeling overall well.  Does note that occasionally he will hear his heartbeat in his left ear.  Reassurance provided.  Endorses eating mild heart healthy diet.  Stays active at work and maintaining his motor home. Reports no shortness of breath nor dyspnea on exertion. Reports no chest pain, pressure, or tightness. No edema, orthopnea, PND. Reports no palpitations.   EKGs/Labs/Other Studies Reviewed:   The following studies were reviewed today:  EKG:  No EKG is ordered today.   The ekg independently reviewed from 11/25/2019 demonstrated SB 57 bpm with sinus arrhythmia and no acute ST/T wave changes.   Recent Labs: 10/21/2019: Hemoglobin 14.2; Platelets 192; TSH 1.450 11/10/2019: BUN 17; Creatinine, Ser 1.17; Potassium 3.8; Sodium 140 12/30/2019: ALT 42  Recent Lipid Panel    Component Value Date/Time   CHOL 116 12/30/2019 0824   CHOL 141 10/21/2019 0846   TRIG 38 12/30/2019 0824   HDL 49 12/30/2019 0824   HDL 51 10/21/2019 0846   CHOLHDL 2.4 12/30/2019 0824   VLDL 8 12/30/2019 0824   LDLCALC 59 12/30/2019 0824   LDLCALC 75 10/21/2019 0846   LDLDIRECT 160.0 02/13/2018 1034    Home Medications   No outpatient medications have been marked as taking for the 03/03/20 encounter (Appointment) with Loel Dubonnet, NP.    Review of Systems      All other systems reviewed and are otherwise negative except as noted above.  Physical Exam    VS:  There were no vitals taken for this visit. , BMI There is no height or weight on file to calculate BMI. GEN: Well nourished, well developed, in no acute distress. HEENT: normal. Neck: Supple, no JVD, carotid bruits, or masses. Cardiac: RRR, no murmurs, rubs, or gallops. No clubbing, cyanosis, edema.  Radials/DP/PT 2+ and equal bilaterally.  Respiratory:  Respirations regular and unlabored, clear to auscultation bilaterally. GI: Soft, nontender, nondistended, BS + x 4. MS: No deformity or atrophy. Skin: Warm and dry, no rash. Neuro:  Strength and sensation are intact. Psych: Normal affect.  Assessment & Plan    1. CAD -GDMT includes aspirin, Effient, rosuvastatin. As he is greater than 12 months from stent intervention and notices significant bruising, continue aspirin and stop Effient.   No beta-blocker secondary to previous exercise intolerance, fatigue, bradycardia.  Continue healthy diet.  Regular cardiovascular exercise encouraged.  2. Ischemic cardiomyopathy -Echo 11/10/2018 with LVEF 55-60%. GDMT includes  aspirin, statin.  No beta-blocker secondary to previous exercise intolerance and bradycardia.  3. HTN - BP well controlled. Continue current antihypertensive regimen including valsartan 160 mg daily.  Refill provided.  4. HLD, LDL goal less than 70 -lipid panel 12/30/2019 with LDL 59.  Continue Zetia 10 mg daily and rosuvastatin 40 mg daily.  Refills provided.  Disposition: Follow up in 6 months with Dr. Saunders Revel or APP  Loel Dubonnet, NP 03/02/2020, 1:23 PM

## 2020-03-03 ENCOUNTER — Encounter: Payer: Self-pay | Admitting: Family

## 2020-03-03 ENCOUNTER — Ambulatory Visit (INDEPENDENT_AMBULATORY_CARE_PROVIDER_SITE_OTHER): Payer: PPO | Admitting: Family

## 2020-03-03 ENCOUNTER — Other Ambulatory Visit: Payer: Self-pay

## 2020-03-03 VITALS — BP 126/84 | HR 63 | Ht 71.0 in | Wt 209.2 lb

## 2020-03-03 DIAGNOSIS — E785 Hyperlipidemia, unspecified: Secondary | ICD-10-CM | POA: Diagnosis not present

## 2020-03-03 DIAGNOSIS — I10 Essential (primary) hypertension: Secondary | ICD-10-CM

## 2020-03-03 DIAGNOSIS — I255 Ischemic cardiomyopathy: Secondary | ICD-10-CM | POA: Diagnosis not present

## 2020-03-03 DIAGNOSIS — I25118 Atherosclerotic heart disease of native coronary artery with other forms of angina pectoris: Secondary | ICD-10-CM | POA: Diagnosis not present

## 2020-03-03 MED ORDER — VALSARTAN 160 MG PO TABS
160.0000 mg | ORAL_TABLET | Freq: Every day | ORAL | 3 refills | Status: DC
Start: 2020-03-03 — End: 2021-04-10

## 2020-03-03 MED ORDER — EZETIMIBE 10 MG PO TABS: 10.0000 mg | ORAL_TABLET | Freq: Every day | ORAL | 3 refills | Status: DC

## 2020-03-03 MED ORDER — ROSUVASTATIN CALCIUM 40 MG PO TABS: 40.0000 mg | ORAL_TABLET | Freq: Every day | ORAL | 3 refills | Status: DC

## 2020-03-03 NOTE — Patient Instructions (Addendum)
Medication Instructions:  Your physician has recommended you make the following change in your medication:   STOP Effient  *If you need a refill on your cardiac medications before your next appointment, please call your pharmacy*  Lab Work: None ordered today  Testing/Procedures: None ordered today.   Follow-Up: At [facility], you and your health needs are our priority.  As part of our continuing mission to provide you with exceptional heart care, we have created designated Provider Care Teams.  These Care Teams include your primary Cardiologist (physician) and Advanced Practice Providers (APPs -  Physician Assistants and Nurse Practitioners) who all work together to provide you with the care you need, when you need it.  We recommend signing up for the patient portal called "MyChart".  Sign up information is provided on this After Visit Summary.  MyChart is used to connect with patients for Virtual Visits (Telemedicine).  Patients are able to view lab/test results, encounter notes, upcoming appointments, etc.  Non-urgent messages can be sent to your provider as well.   To learn more about what you can do with MyChart, go to [email or web address].    Your next appointment:   6 month(s)  The format for your next appointment:   In Person  Provider:   You may see Nelva Bush, MD or one of the following Advanced Practice Providers on your designated Care Team:    Murray Hodgkins, NP  Christell Faith, PA-C  Marrianne Mood, PA-C  Cadence Kathlen Mody, [place]    Other Instructions  Fat and Cholesterol Restricted Eating Plan Getting too much fat and cholesterol in your diet may cause health problems. Choosing the right foods helps keep your fat and cholesterol at normal levels. This can keep you from getting certain diseases.  What are tips for following this plan? Meal planning  At meals, divide your plate into four equal parts: ? Fill one-half of your plate with vegetables and  green salads. ? Fill one-fourth of your plate with whole grains. ? Fill one-fourth of your plate with low-fat (lean) protein foods.  Eat fish that is high in omega-3 fats at least two times a week. This includes mackerel, tuna, sardines, and salmon.  Eat foods that are high in fiber, such as whole grains, beans, apples, broccoli, carrots, peas, and barley. General tips   Work with your doctor to lose weight if you need to.  Avoid: ? Foods with added sugar. ? Fried foods. ? Foods with partially hydrogenated oils.  Limit alcohol intake to no more than 1 drink a day for nonpregnant women and 2 drinks a day for men. One drink equals 12 oz of beer, 5 oz of wine, or 1 oz of hard liquor. Reading food labels  Check food labels for: ? Trans fats. ? Partially hydrogenated oils. ? Saturated fat (g) in each serving. ? Cholesterol (mg) in each serving. ? Fiber (g) in each serving.  Choose foods with healthy fats, such as: ? Monounsaturated fats. ? Polyunsaturated fats. ? Omega-3 fats.  Choose grain products that have whole grains. Look for the word "whole" as the first word in the ingredient list. Cooking  Cook foods using low-fat methods. These include baking, boiling, grilling, and broiling.  Eat more home-cooked foods. Eat at restaurants and buffets less often.  Avoid cooking using saturated fats, such as butter, cream, palm oil, palm kernel oil, and coconut oil. Recommended foods  Fruits  All fresh, canned (in natural juice), or frozen fruits. Vegetables  Fresh or frozen vegetables (  raw, steamed, roasted, or grilled). Green salads. Grains  Whole grains, such as whole wheat or whole grain breads, crackers, cereals, and pasta. Unsweetened oatmeal, bulgur, barley, quinoa, or brown rice. Corn or whole wheat flour tortillas. Meats and other protein foods  Ground beef (85% or leaner), grass-fed beef, or beef trimmed of fat. Skinless chicken or [place]. Ground chicken or [place].  Pork trimmed of fat. All fish and seafood. Egg whites. Dried beans, peas, or lentils. Unsalted nuts or seeds. Unsalted canned beans. Nut butters without added sugar or oil. Dairy  Low-fat or nonfat dairy products, such as skim or 1% milk, 2% or reduced-fat cheeses, low-fat and fat-free ricotta or cottage cheese, or plain low-fat and nonfat yogurt. Fats and oils  Tub margarine without trans fats. Light or reduced-fat mayonnaise and salad dressings. Avocado. Olive, canola, sesame, or safflower oils. The items listed above may not be a complete list of foods and beverages you can eat. Contact a dietitian for more information. Foods to avoid Fruits  Canned fruit in heavy syrup. Fruit in cream or butter sauce. Fried fruit. Vegetables  Vegetables cooked in cheese, cream, or butter sauce. Fried vegetables. Grains  White bread. White pasta. White rice. Cornbread. Bagels, pastries, and croissants. Crackers and snack foods that contain trans fat and hydrogenated oils. Meats and other protein foods  Fatty cuts of meat. Ribs, chicken wings, bacon, sausage, bologna, salami, chitterlings, fatback, hot dogs, bratwurst, and packaged lunch meats. Liver and organ meats. Whole eggs and egg yolks. Chicken and [place] with skin. Fried meat. Dairy  Whole or 2% milk, cream, half-and-half, and cream cheese. Whole milk cheeses. Whole-fat or sweetened yogurt. Full-fat cheeses. Nondairy creamers and whipped toppings. Processed cheese, cheese spreads, and cheese curds. Beverages  Alcohol. Sugar-sweetened drinks such as sodas, lemonade, and fruit drinks. Fats and oils  Butter, stick margarine, lard, shortening, ghee, or bacon fat. Coconut, palm kernel, and palm oils. Sweets and desserts  Corn syrup, sugars, honey, and molasses. Candy. Jam and jelly. Syrup. Sweetened cereals. Cookies, pies, cakes, donuts, muffins, and ice cream. The items listed above may not be a complete list of foods and beverages you should  avoid. Contact a dietitian for more information. Summary  Choosing the right foods helps keep your fat and cholesterol at normal levels. This can keep you from getting certain diseases.  At meals, fill one-half of your plate with vegetables and green salads.  Eat high-fiber foods, like whole grains, beans, apples, carrots, peas, and barley.  Limit added sugar, saturated fats, alcohol, and fried foods. This information is not intended to replace advice given to you by your health care provider. Make sure you discuss any questions you have with your health care provider. Document Revised: 01/08/2018 Document Reviewed: 01/22/2017 Elsevier Patient Education  [place].

## 2020-04-21 ENCOUNTER — Telehealth: Payer: Self-pay | Admitting: Family

## 2020-04-21 NOTE — Telephone Encounter (Signed)
Patient wife would like to discuss patient's Zetia. She is not sure patient is taking at the right time of day. States patient is taking Crestor also. She asks if patient should take Zetia in the a.m. or p.m.

## 2020-04-21 NOTE — Telephone Encounter (Signed)
Notified patient's wife, ok per DPR, that it is ok to take Zetia at morning or evening time when convenient for patient. She was appreciative.

## 2020-06-20 ENCOUNTER — Other Ambulatory Visit: Payer: Self-pay

## 2020-06-20 ENCOUNTER — Telehealth: Payer: Self-pay

## 2020-06-20 ENCOUNTER — Ambulatory Visit
Admission: EM | Admit: 2020-06-20 | Discharge: 2020-06-20 | Disposition: A | Discharge status: Home / Self Care | Payer: PPO | Attending: Family Medicine | Admitting: Family Medicine

## 2020-06-20 DIAGNOSIS — J029 Acute pharyngitis, unspecified: Secondary | ICD-10-CM | POA: Diagnosis not present

## 2020-06-20 DIAGNOSIS — U071 COVID-19: Secondary | ICD-10-CM | POA: Diagnosis not present

## 2020-06-20 LAB — POCT RAPID STREP A (OFFICE): Rapid Strep A Screen: NEGATIVE

## 2020-06-20 MED ORDER — DEXAMETHASONE SODIUM PHOSPHATE 10 MG/ML IJ SOLN
10.0000 mg | Freq: Once | INTRAMUSCULAR | Status: AC
  Administered 2020-06-20: 10 mg via INTRAMUSCULAR

## 2020-06-20 MED ORDER — LIDOCAINE VISCOUS HCL 2 % MT SOLN
15.0000 mL | Freq: Four times a day (QID) | OROMUCOSAL | 0 refills | Status: DC | PRN
Start: 2020-06-20 — End: 2020-07-08

## 2020-06-20 NOTE — Telephone Encounter (Signed)
Patient's wife called stating that her husband does not really want to go to an UC. Coralyn Mark stated that her husband now has white spots on his throat and wants an antibiotic called in. Advised patient's wife unfortunately our providers do not call in antibiotics without at least a virtual visit. Advised patient's wife unfortunately our office does not have any appointments available today or tomorrow for a virtual visit. Patient's wife was given information on the [facility] and encouraged to take her husband there. Patient's wife stated that she will call and see if she can schedule an appointment and if not she will plan on taking him there today to be seen and possibly tested for strep.

## 2020-06-20 NOTE — Telephone Encounter (Signed)
We can do testing without virtual visit if ok'd by provider.  Would offer to bring him in tomorrow pm curbside for strep swab only.

## 2020-06-20 NOTE — Discharge Instructions (Addendum)
Strep test negative. Sending for culture. Believe your sore throat is related to covid.  Steroid injection given here for tonsillar pain, swelling, inflammation.  Prescribing lidocaine gel. You can gargle and spit as needed. DO NOT SWALLOW.

## 2020-06-20 NOTE — Telephone Encounter (Signed)
[facility] Night - Client TELEPHONE ADVICE RECORD [facility] Patient Name: SURAJ RAMDASS Gender: Male DOB: 11-24-1950 Age: 70 Y 46 M 14 D Return Phone Number: [phone] (Primary), [phone] (Secondary) [place] [address] Client [place] Night - Client Client Site [facility] Physician Ria Bush - MD Contact Type Call Who Is Calling Patient / Member / Family / Caregiver Call Type Triage / Clinical Caller Name Hewitt Garner Relationship To Patient Spouse Return Phone Number [phone] (Primary) Chief Complaint Sore Throat Reason for Call Symptomatic / Request for [place] states her husband tested positive for Covid and he has a sore throat. He can barely swallow due to throat pain. She needs to know what she can give him for pain. Translation No Nurse Assessment Nurse: Gilford Rile, RN, Ginny Date/Time (Eastern Time): 06/19/2020 9:04:05 AM Confirm and document reason for call. If symptomatic, describe symptoms. ---Caller stating that her husband tested positive for Covid and he has a sore throat. He can barely swallow due to throat pain. Stating other symptoms of nasal drainage, achey. Does the patient have any new or worsening symptoms? ---Yes Will a triage be completed? ---Yes Related visit to physician within the last 2 weeks? ---No Does the PT have any chronic conditions? (i.e. diabetes, asthma, this includes High risk factors for pregnancy, etc.) ---Yes List chronic conditions. ---Past MI, CAD (Stent) Is this a behavioral health or substance abuse call? ---No Guidelines Guideline Title Affirmed Question Affirmed Notes Nurse Date/Time (Eastern Time) COVID-19 - Diagnosed or Suspected HIGH RISK for severe COVID complications (e.g., weak immune system, age > 65 years, obesity with BMI > 79, pregnant, chronic  lung disease or other chronic medical condition) (Exception: Already seen Gilford Rile, Therapist, sports, Ginny 06/19/2020 9:06:15 AM PLEASE NOTE: All timestamps contained within this report are represented as [place] Standard Time. CONFIDENTIALTY NOTICE: This fax transmission is intended only for the addressee. It contains information that is legally privileged, confidential or otherwise protected from use or disclosure. If you are not the intended recipient, you are strictly prohibited from reviewing, disclosing, copying using or disseminating any of this information or taking any action in reliance on or regarding this information. If you have received this fax in error, please notify us immediately by telephone so that we can arrange for its return to [place]. Phone: [phone], Toll-Free: [phone], Fax: [phone] Page: 2 of 2 Call Id: [id number] Guidelines Guideline Title Affirmed Question Affirmed Notes Nurse Date/Time Eilene Ghazi Time) by PCP and no new or worsening symptoms.) Disp. Time Eilene Ghazi Time) Disposition Final User 06/19/2020 9:25:53 AM Called On-Call Provider Gilford Rile, RN, Viola 06/19/2020 9:21:49 AM Call PCP Now Yes Gilford Rile, RN, Ginny Caller Disagree/Comply Comply Caller Understands Yes PreDisposition Call Doctor Care Advice Given Per Guideline CALL PCP NOW: * I'll page the on-call provider now. If you haven't heard from the provider (or me) within 30 minutes, call again. CALL BACK IF: * You become worse CARE ADVICE given per COVID-19 - DIAGNOSED OR SUSPECTED (Adult) guideline. Paging DoctorName Phone DateTime Result/Outcome Message Type Notes Waunita Schooner- MD [phone] 06/19/2020 9:25:53 AM Called On Call Provider - Reached Doctor Paged Waunita Schooner- MD 06/19/2020 9:26:12 AM Spoke with On Call - General Message Result MD advising patient to call office tomorrow if interested in infusion.

## 2020-06-20 NOTE — ED Triage Notes (Signed)
Pt c/o sore throat, congestion, fever since Friday. States he had positive home COVID test on Saturday. Reports fever of 101 on Saturday, 99 degrees on Sunday. Referred here by PCP to r/o strep throat.  Mild petechiae to oropharynx, tonsil area, and mild edema to uvula.  Denies SOB, n/v/d. Took Advil cold at 1100.

## 2020-06-20 NOTE — Telephone Encounter (Signed)
I spoke with pt; pt was positive for covid 06/18/20; today pt is feeling the same; pt has slight runny nose; dry cough, pt has extreme s/T and throat is swollen; a lot of drainage at back of throat but also  difficulty in swallowing. Pt is wondering if might have strep; no available appts at [facility] and pt will go to [place]. Pt is not sure if will want infusion or not; pt will talk with provider at [facility] if any need for infusion. Sending note to DR G.

## 2020-06-21 NOTE — ED Provider Notes (Signed)
[facility]    CSN: [id number] Arrival date & time: 06/20/20  1328      History   Chief Complaint Chief Complaint  Patient presents with  . Sore Throat    HPI Martin Garza is a 70 y.o. male.   Patient is a 70 year old male who presents today with sore throat, congestion, fever since Friday.  Positive at home Covid test on Saturday.  Reports fever of 101 on Saturday.  Here today due to severe sore throat.  Reporting some mild swelling in the throat area.  Denies any chest pain, shortness of breath.  Has been taking Advil cold and sinus.      Past Medical History:  Diagnosis Date  . Allergy   . CAD (coronary artery disease)    a. 10/2018 NSTEMI/PCI: LM nl, LAD 95p (3.0x12 Resolute Onyx DES), 36m, 85d (2.0x18 Resolute Onyx DES), LCX nl, RCA large, 20ost, 30d.  . Cataract    had surg in both eyes  . Complication of anesthesia    Blood pressure goes up after waking up past sedation/ gets dizzy too per pt!  . Essential hypertension   . Hyperlipidemia LDL goal <70   . Ischemic cardiomyopathy    a. 10/2018 Echo: EF 55-60%, sev apical septal, apical, and ant HK. DD. Nl RV fxn.  . Medical history non-contributory   . Retinal detachment 2014   left  . Umbilical hernia     Patient Active Problem List   Diagnosis Date Noted  . Coronary artery disease involving native coronary artery of native heart with angina pectoris ([place]) 12/31/2018  . Ischemic cardiomyopathy 12/31/2018  . Essential hypertension 12/31/2018  . Non-ST elevation (NSTEMI) myocardial infarction ([place])   . ACS (acute coronary syndrome) ([place]) 11/09/2018  . Health maintenance examination 02/11/2017  . Right foot pain 02/09/2016  . Umbilical hernia 50/01/3817  . Advanced care planning/counseling discussion 01/31/2015  . Medicare annual wellness visit, initial 01/29/2014  . Retinal detachment   . Family history of early CAD 02/19/2012  . Hyperlipidemia LDL goal <70 04/27/2009    Past Surgical  History:  Procedure Laterality Date  . CARDIAC CATHETERIZATION    . CATARACT EXTRACTION  2018   Bil  . COLONOSCOPY  04/2018   TA, SSP, diverticulosis, rpt 3 yrs ([place]); with polypectom y  . CORONARY STENT INTERVENTION N/A 11/10/2018   Procedure: CORONARY STENT INTERVENTION;  Surgeon: Nelva Bush, MD;  Location: [facility];  Service: Cardiovascular;  Laterality: N/A;  . LEFT HEART CATH AND CORONARY ANGIOGRAPHY N/A 11/10/2018   Procedure: LEFT HEART CATH AND CORONARY ANGIOGRAPHY;  Surgeon: Nelva Bush, MD;  Location: [facility];  Service: Cardiovascular;  Laterality: N/A;  . Left inguinal repair  in High School  . RETINAL DETACHMENT SURGERY Left    x 2 in 2014  . UMBILICAL HERNIA REPAIR N/A 05/30/2018   Procedure: LAPAROSCOPIC ASSISTED  UMBILICAL HERNIA REPAIR WITH MESH,  ERAS PATHWAY;  Surgeon: Johnathan Hausen, MD;  Location: [facility];  Service: General;  Laterality: N/A;       Home Medications    Prior to Admission medications   Medication Sig Start Date End Date Taking? Authorizing Provider  aspirin EC 81 MG EC tablet Take 1 tablet (81 mg total) by mouth daily. 11/12/18  Yes Fritzi Mandes, MD  atorvastatin (LIPITOR) 80 MG tablet Take 80 mg by mouth daily. 12/27/19  Yes [provider]  ezetimibe (ZETIA) 10 MG tablet Take 1 tablet (10 mg total) by  mouth daily. 03/03/20  Yes Loel Dubonnet, NP  famotidine (PEPCID) 20 MG tablet TAKE 1 TABLET (20 MG TOTAL) BY MOUTH DAILY AS NEEDED FOR HEARTBURN OR INDIGESTION. 12/24/19  Yes End, Harrell Gave, MD  lidocaine (XYLOCAINE) 2 % solution Use as directed 15 mLs in the mouth or throat every 6 (six) hours as needed for mouth pain. 06/20/20  Yes Nakeesha Bowler A, NP  rosuvastatin (CRESTOR) 40 MG tablet Take 1 tablet (40 mg total) by mouth daily. 03/03/20 02/26/21 Yes Loel Dubonnet, NP  valsartan (DIOVAN) 160 MG tablet Take 1 tablet (160 mg total) by mouth daily. 03/03/20  Yes Loel Dubonnet, NP  acetaminophen  (TYLENOL) 325 MG tablet Take 650 mg by mouth every 6 (six) hours as needed for moderate pain.     [provider]  Cyanocobalamin (B-12 PO) Take 500 mcg by mouth daily.     [provider]  nitroGLYCERIN (NITROSTAT) 0.4 MG SL tablet Place 1 tablet (0.4 mg total) under the tongue every 5 (five) minutes x 3 doses as needed for chest pain. 11/12/18   Fritzi Mandes, MD  vitamin C (ASCORBIC ACID) 500 MG tablet Take 1,000 mg by mouth daily.    [provider]    Family History Family History  Problem Relation Age of Onset  . Cancer Mother 43       metastatic cancer, smoker  . Coronary artery disease Father 51       MI with CABG, smoker  . Stroke Father        after catheterization  . Alcohol abuse Father   . Alcohol abuse Brother   . Coronary artery disease Brother 64       CABG, smoker  . Depression Brother   . Drug abuse Brother   . Hearing loss Daughter   . Cancer Maternal Aunt        breast  . Breast cancer Maternal Aunt   . Diabetes Neg Hx   . Colon cancer Neg Hx   . Rectal cancer Neg Hx   . Stomach cancer Neg Hx     Social History Social History   Tobacco Use  . Smoking status: Never Smoker  . Smokeless tobacco: Former Systems developer    Types: Secondary school teacher  . Vaping Use: Never used  Substance Use Topics  . Alcohol use: Not Currently    Alcohol/week: 1.0 standard drink    Types: 1 Glasses of wine per week    Comment: rare  . Drug use: No     Allergies   Lisinopril   Review of Systems Review of Systems   Physical Exam Triage Vital Signs ED Triage Vitals  Enc Vitals Group     BP 06/20/20 1350 130/82     Pulse Rate 06/20/20 1350 82     Resp 06/20/20 1350 20     Temp 06/20/20 1350 98 F (36.7 C)     Temp Source 06/20/20 1350 Oral     SpO2 06/20/20 1350 97 %     Weight --      Height --      Head Circumference --      Peak Flow --      Pain Score 06/20/20 1348 8     Pain Loc --      Pain Edu? --      Excl. in [place]? --    No data  found.  Updated Vital Signs BP 130/82 (BP Location: Right Arm)   Pulse  82   Temp 98 F (36.7 C) (Oral)   Resp 20   SpO2 97%   Visual Acuity Right Eye Distance:   Left Eye Distance:   Bilateral Distance:    Right Eye Near:   Left Eye Near:    Bilateral Near:     Physical Exam Vitals and nursing note reviewed.  Constitutional:      General: He is not in acute distress.    Appearance: Normal appearance. He is not ill-appearing, toxic-appearing or diaphoretic.  HENT:     Head: Normocephalic and atraumatic.     Right Ear: Tympanic membrane and ear canal normal.     Left Ear: Tympanic membrane and ear canal normal.     Nose: Nose normal.     Mouth/Throat:     Pharynx: Posterior oropharyngeal erythema present.     Tonsils: No tonsillar exudate. 1+ on the right. 1+ on the left.     Comments: Mild swelling to uvula Eyes:     Conjunctiva/sclera: Conjunctivae normal.  Cardiovascular:     Rate and Rhythm: Normal rate and regular rhythm.  Pulmonary:     Effort: Pulmonary effort is normal.     Breath sounds: Normal breath sounds.  Musculoskeletal:        General: Normal range of motion.     Cervical back: Normal range of motion.  Skin:    General: Skin is warm and dry.  Neurological:     Mental Status: He is alert.  Psychiatric:        Mood and Affect: Mood normal.      UC Treatments / Results  Labs (all labs ordered are listed, but only abnormal results are displayed) Labs Reviewed  CULTURE, GROUP A STREP [facility])  POCT RAPID STREP A (OFFICE)    EKG   Radiology No results found.  Procedures Procedures (including critical care time)  Medications Ordered in UC Medications  dexamethasone (DECADRON) injection 10 mg (10 mg Intramuscular Given 06/20/20 1436)    Initial Impression / Assessment and Plan / UC Course  I have reviewed the triage vital signs and the nursing notes.  Pertinent labs & imaging results that were available during my care of the patient  were reviewed by me and considered in my medical decision making (see chart for details).     Sore throat and covid 19 Strep test negative here today.  Will send for culture.  For symptoms related to Covid. Steroid injection given for tonsillar swelling, inflammation. Also prescribed lidocaine gel for discomfort.  Instructions given on how to use this. Follow up as needed for continued or worsening symptoms  Final Clinical Impressions(s) / UC Diagnoses   Final diagnoses:  Sore throat  COVID-19     Discharge Instructions     Strep test negative. Sending for culture. Believe your sore throat is related to covid.  Steroid injection given here for tonsillar pain, swelling, inflammation.  Prescribing lidocaine gel. You can gargle and spit as needed. DO NOT SWALLOW.     ED Prescriptions    Medication Sig Dispense Auth. Provider   lidocaine (XYLOCAINE) 2 % solution Use as directed 15 mLs in the mouth or throat every 6 (six) hours as needed for mouth pain. 100 mL Loura Halt A, NP     PDMP not reviewed this encounter.   Orvan July, NP 06/21/20 1224

## 2020-06-23 LAB — CULTURE, GROUP A STREP (THRC)

## 2020-07-07 ENCOUNTER — Telehealth: Payer: Self-pay | Admitting: Family Medicine

## 2020-07-07 NOTE — Telephone Encounter (Signed)
Error

## 2020-07-08 ENCOUNTER — Other Ambulatory Visit: Payer: Self-pay

## 2020-07-08 ENCOUNTER — Encounter: Payer: Self-pay | Admitting: Family Medicine

## 2020-07-08 ENCOUNTER — Ambulatory Visit (INDEPENDENT_AMBULATORY_CARE_PROVIDER_SITE_OTHER): Payer: PPO | Admitting: Family Medicine

## 2020-07-08 VITALS — BP 112/70 | HR 64 | Temp 98.0°F | Ht 71.0 in | Wt 211.5 lb

## 2020-07-08 DIAGNOSIS — R1031 Right lower quadrant pain: Secondary | ICD-10-CM

## 2020-07-08 DIAGNOSIS — R109 Unspecified abdominal pain: Secondary | ICD-10-CM | POA: Diagnosis not present

## 2020-07-08 LAB — CBC WITH DIFFERENTIAL/PLATELET
Basophils Absolute: 0 10*3/uL (ref 0.0–0.1)
Basophils Relative: 0.4 % (ref 0.0–3.0)
Eosinophils Absolute: 0.2 10*3/uL (ref 0.0–0.7)
Eosinophils Relative: 2.6 % (ref 0.0–5.0)
HCT: 41 % (ref 39.0–52.0)
Hemoglobin: 13.9 g/dL (ref 13.0–17.0)
Lymphocytes Relative: 27.1 % (ref 12.0–46.0)
Lymphs Abs: 1.7 10*3/uL (ref 0.7–4.0)
MCHC: 33.9 g/dL (ref 30.0–36.0)
MCV: 95.4 fl (ref 78.0–100.0)
Monocytes Absolute: 1 10*3/uL (ref 0.1–1.0)
Monocytes Relative: 16.4 % — ABNORMAL HIGH (ref 3.0–12.0)
Neutro Abs: 3.4 10*3/uL (ref 1.4–7.7)
Neutrophils Relative %: 53.5 % (ref 43.0–77.0)
Platelets: 214 10*3/uL (ref 150.0–400.0)
RBC: 4.3 Mil/uL (ref 4.22–5.81)
RDW: 12.9 % (ref 11.5–15.5)
WBC: 6.3 10*3/uL (ref 4.0–10.5)

## 2020-07-08 LAB — POC URINALSYSI DIPSTICK (AUTOMATED)
Bilirubin, UA: NEGATIVE
Blood, UA: NEGATIVE
Glucose, UA: NEGATIVE
Ketones, UA: NEGATIVE
Leukocytes, UA: NEGATIVE
Nitrite, UA: NEGATIVE
Protein, UA: NEGATIVE
Spec Grav, UA: 1.01 (ref 1.010–1.025)
Urobilinogen, UA: 0.2 E.U./dL
pH, UA: 6 (ref 5.0–8.0)

## 2020-07-08 LAB — COMPREHENSIVE METABOLIC PANEL
ALT: 33 U/L (ref 0–53)
AST: 26 U/L (ref 0–37)
Albumin: 4.3 g/dL (ref 3.5–5.2)
Alkaline Phosphatase: 40 U/L (ref 39–117)
BUN: 16 mg/dL (ref 6–23)
CO2: 29 mEq/L (ref 19–32)
Calcium: 9.6 mg/dL (ref 8.4–10.5)
Chloride: 105 mEq/L (ref 96–112)
Creatinine, Ser: 1.15 mg/dL (ref 0.40–1.50)
GFR: 64.03 mL/min (ref >60.00)
Glucose, Bld: 109 mg/dL — ABNORMAL HIGH (ref 70–99)
Potassium: 4.3 mEq/L (ref 3.5–5.1)
Sodium: 140 mEq/L (ref 135–145)
Total Bilirubin: 0.7 mg/dL (ref 0.2–1.2)
Total Protein: 7 g/dL (ref 6.0–8.3)

## 2020-07-08 NOTE — Progress Notes (Signed)
Patient ID: Martin Garza, male    DOB: [birth year], 70 y.o.   MRN: [id number]  This visit was conducted in person.  BP 112/70   Pulse 64   Temp 98 F (36.7 C) (Temporal)   Ht 5\' 11"  (1.803 m)   Wt 211 lb 8 oz (95.9 kg)   SpO2 99%   BMI 29.50 kg/m    CC:  Chief Complaint  Patient presents with  . Flank Pain    Right  . Constipation    Last BM Sunday    Subjective:   HPI: Martin Garza is a 70 y.o. male  Patient of Dr. Darnell Level with history of  CAD, HTN, cardiomyopathy presenting on 07/08/2020 for Flank Pain (Right) and Constipation (Last BM Sunday)  New onset pain in right flank. First noted 6 days ago, gradualy onset. Occurred in car on way back from a ride. Felt pinch in mid lower back.  No change in activity, no falls.   He has tried treating with lemonade, cranberry. Took tylenol, used heating pad.   Pain continued to worsen, now radiates to RLQ.  Pain better with up and moving,  Worse when lies down.   No urinary symptoms, no dysuria. NO fever.  no change in motion.  No  BM in last week... this is unusual for him.  Took dulcolax last night.. tiny BM today.   History of hernia repair  Colonoscopy in  2019.. diverticula in left colon. Urinalysis    Component Value Date/Time   BILIRUBINUR Negative 07/08/2020 1000   PROTEINUR Negative 07/08/2020 1000   UROBILINOGEN 0.2 07/08/2020 1000   NITRITE Negative 07/08/2020 1000   LEUKOCYTESUR Negative 07/08/2020 1000   No blood, no glucose on UA       Relevant past medical, surgical, family and social history reviewed and updated as indicated. Interim medical history since our last visit reviewed. Allergies and medications reviewed and updated. Outpatient Medications Prior to Visit  Medication Sig Dispense Refill  . acetaminophen (TYLENOL) 325 MG tablet Take 650 mg by mouth every 6 (six) hours as needed for moderate pain.     Marland Kitchen aspirin EC 81 MG EC tablet Take 1 tablet (81 mg total) by mouth daily. 30 tablet 1   . Cyanocobalamin (B-12 PO) Take 500 mcg by mouth daily.     Marland Kitchen ezetimibe (ZETIA) 10 MG tablet Take 1 tablet (10 mg total) by mouth daily. 90 tablet 3  . famotidine (PEPCID) 20 MG tablet TAKE 1 TABLET (20 MG TOTAL) BY MOUTH DAILY AS NEEDED FOR HEARTBURN OR INDIGESTION. 90 tablet 0  . nitroGLYCERIN (NITROSTAT) 0.4 MG SL tablet Place 1 tablet (0.4 mg total) under the tongue every 5 (five) minutes x 3 doses as needed for chest pain. 20 tablet 2  . rosuvastatin (CRESTOR) 40 MG tablet Take 1 tablet (40 mg total) by mouth daily. 90 tablet 3  . valsartan (DIOVAN) 160 MG tablet Take 1 tablet (160 mg total) by mouth daily. 90 tablet 3  . vitamin C (ASCORBIC ACID) 500 MG tablet Take 1,000 mg by mouth daily.    Marland Kitchen atorvastatin (LIPITOR) 80 MG tablet Take 80 mg by mouth daily.    Marland Kitchen lidocaine (XYLOCAINE) 2 % solution Use as directed 15 mLs in the mouth or throat every 6 (six) hours as needed for mouth pain. 100 mL 0   No facility-administered medications prior to visit.     Per HPI unless specifically indicated in ROS section below Review of Systems  Constitutional: Negative for fatigue and fever.  HENT: Negative for ear pain.   Eyes: Negative for pain.  Respiratory: Negative for cough and shortness of breath.   Cardiovascular: Negative for chest pain, palpitations and leg swelling.  Gastrointestinal: Positive for constipation. Negative for abdominal pain.  Genitourinary: Positive for flank pain. Negative for dysuria.  Musculoskeletal: Positive for back pain. Negative for arthralgias.  Neurological: Negative for syncope, light-headedness and headaches.  Psychiatric/Behavioral: Negative for dysphoric mood.   Objective:  BP 112/70   Pulse 64   Temp 98 F (36.7 C) (Temporal)   Ht 5\' 11"  (1.803 m)   Wt 211 lb 8 oz (95.9 kg)   SpO2 99%   BMI 29.50 kg/m   Wt Readings from Last 3 Encounters:  07/08/20 211 lb 8 oz (95.9 kg)  03/03/20 209 lb 3.2 oz (94.9 kg)  11/25/19 211 lb 6 oz (95.9 kg)       Physical Exam Constitutional:      Appearance: He is well-developed.  HENT:     Head: Normocephalic.     Right Ear: Hearing normal.     Left Ear: Hearing normal.     Nose: Nose normal.  Neck:     Thyroid: No thyroid mass or thyromegaly.     Vascular: No carotid bruit.     Trachea: Trachea normal.  Cardiovascular:     Rate and Rhythm: Normal rate and regular rhythm.     Pulses: Normal pulses.     Heart sounds: Heart sounds not distant. No murmur heard. No friction rub. No gallop.      Comments: No peripheral edema Pulmonary:     Effort: Pulmonary effort is normal. No respiratory distress.     Breath sounds: Normal breath sounds.  Skin:    General: Skin is warm and dry.     Findings: No rash.  Psychiatric:        Speech: Speech normal.        Behavior: Behavior normal.        Thought Content: Thought content normal.       Results for orders placed or performed in visit on 07/08/20  POCT Urinalysis Dipstick (Automated)  Result Value Ref Range   Color, UA Yellow    Clarity, UA Clear    Glucose, UA Negative Negative   Bilirubin, UA Negative    Ketones, UA Negative    Spec Grav, UA 1.010 1.010 - 1.025   Blood, UA Negative    pH, UA 6.0 5.0 - 8.0   Protein, UA Negative Negative   Urobilinogen, UA 0.2 0.2 or 1.0 E.U./dL   Nitrite, UA Negative    Leukocytes, UA Negative Negative    This visit occurred during the SARS-CoV-2 public health emergency.  Safety protocols were in place, including screening questions prior to the visit, additional usage of staff PPE, and extensive cleaning of exam room while observing appropriate contact time as indicated for disinfecting solutions.   COVID 19 screen:  No recent travel or known exposure to COVID19 The patient denies respiratory symptoms of COVID 19 at this time. The importance of social distancing was discussed today.   Assessment and Plan    Problem List Items Addressed This Visit    Right flank pain - Primary    No sign  of UTI and no blood in urine sugessting kidney stone.   Eval with labs.      Relevant Orders   POCT Urinalysis Dipstick (Automated) (Completed)   RLQ abdominal pain  Possibly due to constipation. Start Miralax 17 g twice daily until BM.  Keep up with water.  Call if pain increasing.Marland Kitchen go to ER if severe pain, throwing up or fever.       Relevant Orders   Comprehensive metabolic panel (Completed)   CBC with Differential/Platelet (Completed)       Eliezer Lofts, MD

## 2020-07-08 NOTE — Patient Instructions (Addendum)
Please stop at the lab to have labs drawn.  Start Miralax 17 g twice daily until BM.  Keep up with water.  Call if pain increasing.Marland Kitchen go to ER if severe pain, throwing up or fever.

## 2020-07-11 ENCOUNTER — Telehealth: Payer: Self-pay | Admitting: *Deleted

## 2020-07-11 NOTE — Telephone Encounter (Signed)
Patient left a voicemail stating that he was seen Friday by Dr. Maryjane Hurter and was told to call the office today. Tried to call patient back and got his voicemail. Unable to leave a message because patient's in box was full. Will have to try and call patient back later or wait for him to call the office back.

## 2020-07-12 NOTE — Telephone Encounter (Signed)
Next step will be an abd CT to eva further if pt agreeable... let me know locations and I will order.

## 2020-07-12 NOTE — Telephone Encounter (Signed)
Spoke to patient and was advised that he is still having the back pain. Patient stated that he has had several bowel movements since his visit. Patient stated the bowel movements don't seem to be quite right. Patient stated that the bowel movements are small in size like there may be something there blocking the stool. Patient stated that he can not sleep on his back and after a while can manage to sleep on his right side. Patient stated when he gets up during the night and walks the pain seems to improve but then gets worse when he lays back down. Patient stated sometimes the pain level is a 8-9. Patient stated that the pan is causing him not to be able to sleep at night. Patient wants to know what the next step would be. Pharmacy [facility]

## 2020-07-13 ENCOUNTER — Other Ambulatory Visit: Payer: Self-pay

## 2020-07-13 ENCOUNTER — Ambulatory Visit (INDEPENDENT_AMBULATORY_CARE_PROVIDER_SITE_OTHER)
Admission: RE | Admit: 2020-07-13 | Discharge: 2020-07-13 | Disposition: A | Discharge status: Home / Self Care | Payer: PPO | Source: Ambulatory Visit | Attending: Family Medicine | Admitting: Family Medicine

## 2020-07-13 DIAGNOSIS — R109 Unspecified abdominal pain: Secondary | ICD-10-CM | POA: Diagnosis not present

## 2020-07-13 DIAGNOSIS — R1031 Right lower quadrant pain: Secondary | ICD-10-CM

## 2020-07-13 DIAGNOSIS — K760 Fatty (change of) liver, not elsewhere classified: Secondary | ICD-10-CM | POA: Diagnosis not present

## 2020-07-13 MED ORDER — IOHEXOL 300 MG/ML  SOLN
100.0000 mL | Freq: Once | INTRAMUSCULAR | Status: AC | PRN
  Administered 2020-07-13: 100 mL via INTRAVENOUS

## 2020-09-14 DIAGNOSIS — R1031 Right lower quadrant pain: Secondary | ICD-10-CM | POA: Insufficient documentation

## 2020-09-14 DIAGNOSIS — R109 Unspecified abdominal pain: Secondary | ICD-10-CM | POA: Insufficient documentation

## 2020-09-14 NOTE — Assessment & Plan Note (Addendum)
No sign of UTI and no blood in urine sugessting kidney stone.   Eval with labs.

## 2020-09-14 NOTE — Assessment & Plan Note (Signed)
Possibly due to constipation. Start Miralax 17 g twice daily until BM.  Keep up with water.  Call if pain increasing.Marland Kitchen go to ER if severe pain, throwing up or fever.

## 2020-09-16 ENCOUNTER — Ambulatory Visit (INDEPENDENT_AMBULATORY_CARE_PROVIDER_SITE_OTHER): Payer: PPO | Admitting: Internal Medicine

## 2020-09-16 ENCOUNTER — Encounter: Payer: Self-pay | Admitting: Internal Medicine

## 2020-09-16 ENCOUNTER — Other Ambulatory Visit: Payer: Self-pay

## 2020-09-16 VITALS — BP 126/80 | HR 58 | Ht 71.0 in | Wt 216.0 lb

## 2020-09-16 DIAGNOSIS — I10 Essential (primary) hypertension: Secondary | ICD-10-CM

## 2020-09-16 DIAGNOSIS — H524 Presbyopia: Secondary | ICD-10-CM | POA: Diagnosis not present

## 2020-09-16 DIAGNOSIS — I2510 Atherosclerotic heart disease of native coronary artery without angina pectoris: Secondary | ICD-10-CM | POA: Diagnosis not present

## 2020-09-16 DIAGNOSIS — R42 Dizziness and giddiness: Secondary | ICD-10-CM | POA: Diagnosis not present

## 2020-09-16 DIAGNOSIS — R0602 Shortness of breath: Secondary | ICD-10-CM | POA: Insufficient documentation

## 2020-09-16 DIAGNOSIS — Z961 Presence of intraocular lens: Secondary | ICD-10-CM | POA: Diagnosis not present

## 2020-09-16 DIAGNOSIS — R5383 Other fatigue: Secondary | ICD-10-CM | POA: Insufficient documentation

## 2020-09-16 DIAGNOSIS — E785 Hyperlipidemia, unspecified: Secondary | ICD-10-CM

## 2020-09-16 DIAGNOSIS — R0609 Other forms of dyspnea: Secondary | ICD-10-CM | POA: Insufficient documentation

## 2020-09-16 DIAGNOSIS — H35352 Cystoid macular degeneration, left eye: Secondary | ICD-10-CM | POA: Diagnosis not present

## 2020-09-16 NOTE — Patient Instructions (Signed)
Medication Instructions:  Your physician recommends that you continue on your current medications as directed. Please refer to the Current Medication list given to you today.  *If you need a refill on your cardiac medications before your next appointment, please call your pharmacy*   Lab Work: None ordered If you have labs (blood work) drawn today and your tests are completely normal, you will receive your results only by: Marland Kitchen MyChart Message (if you have MyChart) OR . A paper copy in the mail If you have any lab test that is abnormal or we need to change your treatment, we will call you to review the results.   Testing/Procedures: Your physician has requested that you have an echocardiogram. Echocardiography is a painless test that uses sound waves to create images of your heart. It provides your doctor with information about the size and shape of your heart and how well your heart's chambers and valves are working. This procedure takes approximately one hour. There are no restrictions for this procedure.     Follow-Up: At [facility], you and your health needs are our priority.  As part of our continuing mission to provide you with exceptional heart care, we have created designated Provider Care Teams.  These Care Teams include your primary Cardiologist (physician) and Advanced Practice Providers (APPs -  Physician Assistants and Nurse Practitioners) who all work together to provide you with the care you need, when you need it.  We recommend signing up for the patient portal called "MyChart".  Sign up information is provided on this After Visit Summary.  MyChart is used to connect with patients for Virtual Visits (Telemedicine).  Patients are able to view lab/test results, encounter notes, upcoming appointments, etc.  Non-urgent messages can be sent to your provider as well.   To learn more about what you can do with MyChart, go to [email or web address].    Your next appointment:   2  month(s)  The format for your next appointment:   In Person  Provider:   You may see Nelva Bush, MD or one of the following Advanced Practice Providers on your designated Care Team:    Murray Hodgkins, NP  Christell Faith, PA-C  Marrianne Mood, PA-C  Cadence Kathlen Mody, [place]  Laurann Montana, NP    Other Instructions Decrease your caffeine intake and increase your water intake.

## 2020-09-16 NOTE — Progress Notes (Signed)
Follow-up Outpatient Visit Date: 09/16/2020  Primary Care Provider: Ria Bush, MD [address] [place] [address]  Chief Complaint: Lightheadedness and fatigue  HPI:  Martin Garza is a 70 y.o. male with history of coronary artery disease status post PCI to proximal LAD in the setting of NSTEMI (complicated by loss of flow in the distal LAD), hypertension, hyperlipidemia, and ischemic cardiomyopathy, who presents for follow-up of coronary artery disease and cardiomyopathy.  He was last seen in our office in 02/2020 by Laurann Montana, NP, at which time he was feeling well.  Prasugrel was discontinued, as he was more than 12 months out from his MI/PCI.  No other medication changes or additional testing were pursued.  Today, Martin Garza reports that he has experienced 2 episodes of significant lightheadedness during which he was afraid he might fall or blackout.  The first occurred when he got up in the middle the night to use the bathroom.  The second happened while he was sitting at his desk.  He denies associated symptoms, with resolution of the lightheadedness after a few minutes.  He notes that his blood pressure at home is typically well controlled.  He tries to stay well-hydrated but endorses drinking several cups of coffee per day as well as caffeinated water.  Martin Garza has not had any chest pain though he has experienced increased fatigue and exertional dyspnea over the last year.  He wonders if some of this could be due to weight gain.  He has not had any palpitations or edema.  He had an episode of right-sided flank pain in February with CT being unrevealing.  Pain has since resolved.   --------------------------------------------------------------------------------------------------  Past Medical History:  Diagnosis Date  . Allergy   . CAD (coronary artery disease)    a. 10/2018 NSTEMI/PCI: LM nl, LAD 95p (3.0x12 Resolute Onyx DES), 9m, 85d (2.0x18 Resolute Onyx  DES), LCX nl, RCA large, 20ost, 30d.  . Cataract    had surg in both eyes  . Complication of anesthesia    Blood pressure goes up after waking up past sedation/ gets dizzy too per pt!  . Essential hypertension   . Hyperlipidemia LDL goal <70   . Ischemic cardiomyopathy    a. 10/2018 Echo: EF 55-60%, sev apical septal, apical, and ant HK. DD. Nl RV fxn.  . Medical history non-contributory   . Retinal detachment 2014   left  . Umbilical hernia    Past Surgical History:  Procedure Laterality Date  . CARDIAC CATHETERIZATION    . CATARACT EXTRACTION  2018   Bil  . COLONOSCOPY  04/2018   TA, SSP, diverticulosis, rpt 3 yrs ([facility]); with polypectom y  . CORONARY STENT INTERVENTION N/A 11/10/2018   Procedure: CORONARY STENT INTERVENTION;  Surgeon: Nelva Bush, MD;  Location: [facility];  Service: Cardiovascular;  Laterality: N/A;  . LEFT HEART CATH AND CORONARY ANGIOGRAPHY N/A 11/10/2018   Procedure: LEFT HEART CATH AND CORONARY ANGIOGRAPHY;  Surgeon: Nelva Bush, MD;  Location: [facility];  Service: Cardiovascular;  Laterality: N/A;  . Left inguinal repair  in High School  . RETINAL DETACHMENT SURGERY Left    x 2 in 2014  . UMBILICAL HERNIA REPAIR N/A 05/30/2018   Procedure: LAPAROSCOPIC ASSISTED  UMBILICAL HERNIA REPAIR WITH MESH,  ERAS PATHWAY;  Surgeon: Johnathan Hausen, MD;  Location: [facility];  Service: General;  Laterality: N/A;    Current Meds  Medication Sig  . acetaminophen (TYLENOL) 325 MG  tablet Take 650 mg by mouth every 6 (six) hours as needed for moderate pain.   Marland Kitchen aspirin EC 81 MG EC tablet Take 1 tablet (81 mg total) by mouth daily.  . Cyanocobalamin (B-12 PO) Take 500 mcg by mouth daily.   Marland Kitchen ezetimibe (ZETIA) 10 MG tablet Take 1 tablet (10 mg total) by mouth daily.  . famotidine (PEPCID) 20 MG tablet TAKE 1 TABLET (20 MG TOTAL) BY MOUTH DAILY AS NEEDED FOR HEARTBURN OR INDIGESTION.  . nitroGLYCERIN (NITROSTAT) 0.4 MG SL tablet Place 1 tablet (0.4  mg total) under the tongue every 5 (five) minutes x 3 doses as needed for chest pain.  . rosuvastatin (CRESTOR) 40 MG tablet Take 1 tablet (40 mg total) by mouth daily.  . valsartan (DIOVAN) 160 MG tablet Take 1 tablet (160 mg total) by mouth daily.  . vitamin C (ASCORBIC ACID) 500 MG tablet Take 1,000 mg by mouth daily.    Allergies: Lisinopril  Social History   Tobacco Use  . Smoking status: Never Smoker  . Smokeless tobacco: Former Systems developer    Types: Secondary school teacher  . Vaping Use: Never used  Substance Use Topics  . Alcohol use: Not Currently    Alcohol/week: 1.0 standard drink    Types: 1 Glasses of wine per week    Comment: rare  . Drug use: No    Family History  Problem Relation Age of Onset  . Cancer Mother 4       metastatic cancer, smoker  . Coronary artery disease Father 58       MI with CABG, smoker  . Stroke Father        after catheterization  . Alcohol abuse Father   . Alcohol abuse Brother   . Coronary artery disease Brother 25       CABG, smoker  . Depression Brother   . Drug abuse Brother   . Hearing loss Daughter   . Cancer Maternal Aunt        breast  . Breast cancer Maternal Aunt   . Diabetes Neg Hx   . Colon cancer Neg Hx   . Rectal cancer Neg Hx   . Stomach cancer Neg Hx     Review of Systems: A 12-system review of systems was performed and was negative except as noted in the HPI.  --------------------------------------------------------------------------------------------------  Physical Exam: BP 126/80 (BP Location: Left Arm, Patient Position: Sitting, Cuff Size: Large)   Pulse (!) 58   Ht 5\' 11"  (1.803 m)   Wt 216 lb (98 kg)   SpO2 97%   BMI 30.13 kg/m   General:  NAD. Neck: No JVD or HJR. Lungs: Clear to auscultation bilaterally without wheezes or crackles. Heart: Regular rate and rhythm without murmurs, rubs, or gallops. Abdomen: Soft, nontender, nondistended. Extremities: No lower extremity edema.  EKG: Sinus bradycardia.   Otherwise normal EKG.  Lab Results  Component Value Date   WBC 6.3 07/08/2020   HGB 13.9 07/08/2020   HCT 41.0 07/08/2020   MCV 95.4 07/08/2020   PLT 214.0 07/08/2020    Lab Results  Component Value Date   NA 140 07/08/2020   K 4.3 07/08/2020   CL 105 07/08/2020   CO2 29 07/08/2020   BUN 16 07/08/2020   CREATININE 1.15 07/08/2020   GLUCOSE 109 (H) 07/08/2020   ALT 33 07/08/2020    Lab Results  Component Value Date   CHOL 116 12/30/2019   HDL 49 12/30/2019   LDLCALC 59  12/30/2019   LDLDIRECT 160.0 02/13/2018   TRIG 38 12/30/2019   CHOLHDL 2.4 12/30/2019    --------------------------------------------------------------------------------------------------  ASSESSMENT AND PLAN: Coronary artery disease with shortness of breath and fatigue: Martin Garza does not report any chest pain but has experienced some increased exertional dyspnea and generalized fatigue over the last 6 to 12 months.  His physical exam today is unrevealing.  EKG is also normal.  We discussed the symptoms as a potential anginal equivalent.  I have recommended obtaining a transthoracic echocardiogram.  If this does not reveal any significant structural abnormalities, we will subsequently proceed with exercise myocardial perfusion stress testing.  In the meantime, we will continue his current medications for secondary prevention, including aspirin, rosuvastatin, and ezetimibe.  Lightheadedness and hypertension: Two episodes of lightheadedness reported, both of which sound orthostatic.  He has an initial drop in blood pressure today from lying to sitting, though his blood pressure subsequently rises when standing.  It is possible that dehydration and/or medication side effects could be contributing.  I have recommended reduction in caffeine consumption and drinking plenty of water to stay well-hydrated.  We will defer medication changes at this time.  Hyperlipidemia: LDL at goal on last check in 12/2019.  Continue  rosuvastatin 40 mg daily and ezetimibe 10 mg daily.  Shared Decision Making/Informed Consent The risks [chest pain, shortness of breath, cardiac arrhythmias, dizziness, blood pressure fluctuations, myocardial infarction, stroke/transient ischemic attack, nausea, vomiting, allergic reaction, radiation exposure, metallic taste sensation and life-threatening complications (estimated to be 1 in 10,000)], benefits (risk stratification, diagnosing coronary artery disease, treatment guidance) and alternatives of a nuclear stress test were discussed in detail with Martin Garza and he agrees to proceed.  Follow-up: Return to clinic in 2 months.  Nelva Bush, MD 09/16/2020 9:59 AM

## 2020-10-18 ENCOUNTER — Ambulatory Visit (INDEPENDENT_AMBULATORY_CARE_PROVIDER_SITE_OTHER): Payer: PPO

## 2020-10-18 ENCOUNTER — Other Ambulatory Visit: Payer: Self-pay

## 2020-10-18 DIAGNOSIS — R0602 Shortness of breath: Secondary | ICD-10-CM | POA: Diagnosis not present

## 2020-10-18 LAB — ECHOCARDIOGRAM COMPLETE
AR max vel: 3.57 cm2
AV Area VTI: 3.21 cm2
AV Area mean vel: 3.21 cm2
AV Mean grad: 4 mmHg
AV Peak grad: 7.5 mmHg
Ao pk vel: 1.37 m/s
Area-P 1/2: 2.77 cm2
Calc EF: 59.3 %
S' Lateral: 3.2 cm
Single Plane A2C EF: 55.7 %
Single Plane A4C EF: 66.1 %

## 2020-10-18 MED ORDER — PERFLUTREN LIPID MICROSPHERE
1.0000 mL | INTRAVENOUS | Status: AC | PRN
  Administered 2020-10-18: 2 mL via INTRAVENOUS

## 2020-10-20 ENCOUNTER — Telehealth: Payer: Self-pay | Admitting: *Deleted

## 2020-10-20 DIAGNOSIS — R0609 Other forms of dyspnea: Secondary | ICD-10-CM

## 2020-10-20 DIAGNOSIS — I208 Other forms of angina pectoris: Secondary | ICD-10-CM

## 2020-10-20 DIAGNOSIS — R0600 Dyspnea, unspecified: Secondary | ICD-10-CM

## 2020-10-20 NOTE — Telephone Encounter (Signed)
-----   Message from Nelva Bush, MD sent at 10/19/2020  7:11 AM EDT ----- Please let Martin Garza know that his echocardiogram shows that his heart is contracting normally.  No significant valvular abnormality is identified.  As we discussed at our last office visit, I suggest that we obtain an exercise myocardial perfusion stress test for further evaluation of his exertional dyspnea concerning for anginal equivalent.  We will follow-up as planned at the end of this month to review his testing and reassess his symptoms.

## 2020-10-20 NOTE — Telephone Encounter (Deleted)
[place]  Your caregiver has ordered a Stress Test with nuclear imaging. The purpose of this test is to evaluate the blood supply to your heart muscle. This procedure is referred to as a "Non-Invasive Stress Test." This is because other than having an IV started in your vein, nothing is inserted or "invades" your body. Cardiac stress tests are done to find areas of poor blood flow to the heart by determining the extent of coronary artery disease (CAD). Some patients exercise on a treadmill, which naturally increases the blood flow to your heart, while others who are  unable to walk on a treadmill due to physical limitations have a pharmacologic/chemical stress agent called Lexiscan . This medicine will mimic walking on a treadmill by temporarily increasing your coronary blood flow.   Please note: these test may take anywhere between 2-4 hours to complete  PLEASE REPORT TO [facility]  THE VOLUNTEERS AT THE FIRST DESK WILL DIRECT YOU WHERE TO GO  Date of Procedure:_____________________________________  Arrival Time for Procedure:______________________________  Instructions regarding medication:   ____ : Hold diabetes medication morning of procedure  ____:  Hold betablocker(s) night before procedure and morning of procedure  ____:  Hold other medications as   PLEASE NOTIFY THE OFFICE AT LEAST 24 HOURS IN ADVANCE IF YOU ARE UNABLE TO KEEP YOUR APPOINTMENT.  [phone] AND  PLEASE NOTIFY NUCLEAR MEDICINE AT [facility] AT LEAST 24 HOURS IN ADVANCE IF YOU ARE UNABLE TO KEEP YOUR APPOINTMENT. [phone]  How to prepare for your Myoview test:  1. Do not eat or drink after midnight 2. No caffeine  for 24 hours prior to test 3. No smoking 24 hours prior to test. 4. Your medication may be taken with water.  If your doctor stopped a medication because of this test, do not take that medication. 5. Ladies, please do not wear dresses.  Skirts or pants are appropriate. Please wear a short sleeve shirt. 6. No perfume, cologne or lotion. 7. Wear comfortable walking shoes. No heels!

## 2020-10-21 NOTE — Telephone Encounter (Signed)
-----   Message from Nelva Bush, MD sent at 10/19/2020  7:11 AM EDT ----- Please let Mr. Bannister know that his echocardiogram shows that his heart is contracting normally.  No significant valvular abnormality is identified.  As we discussed at our last office visit, I suggest that we obtain an exercise myocardial perfusion stress test for further evaluation of his exertional dyspnea concerning for anginal equivalent.  We will follow-up as planned at the end of this month to review his testing and reassess his symptoms.

## 2020-10-21 NOTE — Telephone Encounter (Signed)
Spoke to pt, notified of echo results and Dr. Darnelle Bos recc to obtain an exercise myocardial perfusion stress test for further evaluation of his exertional dyspnea concerning for anginal equivalent. And pt will follow up as scheduled 11/16/20 with Dr. Saunders Revel. Pt voiced understanding and agrees to proceed with Exercise Myoview as planned.  Reviewed instructions below with pt. Pt aware scheduling will call him to set up date/time for Myoview.  Forwarded note to scheduling to contact pt with date/time prior to follow up on 6/29.     Your caregiver has ordered an Exercise Myoview Stress Test with nuclear imaging. The purpose of this test is to evaluate the blood supply to your heart muscle. This procedure is referred to as a "Non-Invasive Stress Test." This is because other than having an IV started in your vein, nothing is inserted or "invades" your body. Cardiac stress tests are done to find areas of poor blood flow to the heart by determining the extent of coronary artery disease (CAD). Some patients exercise on a treadmill, which naturally increases the blood flow to your heart, while others who are  unable to walk on a treadmill due to physical limitations have a pharmacologic/chemical stress agent called Lexiscan . This medicine will mimic walking on a treadmill by temporarily increasing your coronary blood flow.   Please note: these test may take anywhere between 2-4 hours to complete  PLEASE REPORT TO [place] AT THE FIRST DESK WILL DIRECT YOU WHERE TO GO  Date of Procedure:_____________________________________  Arrival Time for Procedure:______________________________  PLEASE NOTIFY THE OFFICE AT LEAST 24 HOURS IN ADVANCE IF YOU ARE UNABLE TO KEEP YOUR APPOINTMENT.  [phone] AND  PLEASE NOTIFY NUCLEAR MEDICINE AT [facility] AT LEAST 24 HOURS IN ADVANCE IF YOU ARE UNABLE TO KEEP YOUR APPOINTMENT. [phone]  How to prepare for your Myoview test:  1. Do not eat  or drink after midnight 2. No caffeine for 24 hours prior to test 3. No smoking 24 hours prior to test. 4. Your medication may be taken with water.  If your doctor stopped a medication because of this test, do not take that medication. 5. Please wear a short sleeve shirt. 6. No perfume, cologne or lotion. 7. Wear comfortable walking shoes. No heels!

## 2020-10-24 ENCOUNTER — Telehealth: Payer: Self-pay | Admitting: Family Medicine

## 2020-10-24 NOTE — Telephone Encounter (Signed)
Martin Garza called in her husband saw Dr. Diona Browner for pain on the side, and they sent him to do a MRI and they saw something on his prostate and wanted to know if he needs to get a appointment to see Dr. Darnell Level or get a referral to someone to check his prostate.    Please advise

## 2020-10-24 NOTE — Telephone Encounter (Signed)
Yes please schedule OV. May place in slot at end of morning or afternoon session this week.  He is overdue for CPE as well (last done 2019).

## 2020-10-25 NOTE — Telephone Encounter (Signed)
Thanks

## 2020-10-25 NOTE — Telephone Encounter (Signed)
Pt has been scheduled for Exercise Myoview 11/04/20.  Follow up with Dr. Saunders Revel 11/16/20.

## 2020-10-25 NOTE — Telephone Encounter (Signed)
Patient is scheduled for tom 10/26/20 at 4:30 in office appt. EM CPE is scheduled for OCT per patient request. EM

## 2020-10-26 ENCOUNTER — Ambulatory Visit (INDEPENDENT_AMBULATORY_CARE_PROVIDER_SITE_OTHER): Payer: PPO | Admitting: Family Medicine

## 2020-10-26 ENCOUNTER — Encounter: Payer: Self-pay | Admitting: Family Medicine

## 2020-10-26 ENCOUNTER — Other Ambulatory Visit: Payer: Self-pay

## 2020-10-26 VITALS — BP 136/60 | HR 69 | Temp 97.5°F | Ht 71.0 in | Wt 213.1 lb

## 2020-10-26 DIAGNOSIS — I10 Essential (primary) hypertension: Secondary | ICD-10-CM | POA: Diagnosis not present

## 2020-10-26 DIAGNOSIS — N429 Disorder of prostate, unspecified: Secondary | ICD-10-CM | POA: Diagnosis not present

## 2020-10-26 DIAGNOSIS — I2510 Atherosclerotic heart disease of native coronary artery without angina pectoris: Secondary | ICD-10-CM

## 2020-10-26 DIAGNOSIS — M48061 Spinal stenosis, lumbar region without neurogenic claudication: Secondary | ICD-10-CM | POA: Diagnosis not present

## 2020-10-26 DIAGNOSIS — R5383 Other fatigue: Secondary | ICD-10-CM | POA: Diagnosis not present

## 2020-10-26 DIAGNOSIS — K5790 Diverticulosis of intestine, part unspecified, without perforation or abscess without bleeding: Secondary | ICD-10-CM | POA: Insufficient documentation

## 2020-10-26 DIAGNOSIS — I700 Atherosclerosis of aorta: Secondary | ICD-10-CM | POA: Diagnosis not present

## 2020-10-26 DIAGNOSIS — R0602 Shortness of breath: Secondary | ICD-10-CM | POA: Diagnosis not present

## 2020-10-26 DIAGNOSIS — K760 Fatty (change of) liver, not elsewhere classified: Secondary | ICD-10-CM | POA: Insufficient documentation

## 2020-10-26 DIAGNOSIS — R9389 Abnormal findings on diagnostic imaging of other specified body structures: Secondary | ICD-10-CM | POA: Diagnosis not present

## 2020-10-26 LAB — POC URINALSYSI DIPSTICK (AUTOMATED)
Bilirubin, UA: NEGATIVE
Blood, UA: NEGATIVE
Glucose, UA: NEGATIVE
Ketones, UA: NEGATIVE
Leukocytes, UA: NEGATIVE
Nitrite, UA: NEGATIVE
Protein, UA: NEGATIVE
Spec Grav, UA: 1.025 (ref 1.010–1.025)
Urobilinogen, UA: 2 E.U./dL — AB
pH, UA: 6 (ref 5.0–8.0)

## 2020-10-26 NOTE — Assessment & Plan Note (Signed)
By CT - continue aspirin, statin, zetia.

## 2020-10-26 NOTE — Assessment & Plan Note (Signed)
Appreciate cards care, followed by Dr End on aspirin, statin, zetia.

## 2020-10-26 NOTE — Patient Instructions (Addendum)
Lab today  Urinalysis today  We will be in touch with results and plan.  For indigestion - try gas-X (simethicone)

## 2020-10-26 NOTE — Assessment & Plan Note (Signed)
Chronic, stable period on valsartan 160mg  daily.

## 2020-10-26 NOTE — Progress Notes (Signed)
Patient ID: Martin Garza, male    DOB: [birth year], 70 y.o.   MRN: [id number]  This visit was conducted in person.  BP 136/60   Pulse 69   Temp (!) 97.5 F (36.4 C) (Temporal)   Ht 5\' 11"  (1.803 m)   Wt 213 lb 2 oz (96.7 kg)   SpO2 97%   BMI 29.72 kg/m    CC: abnormal imaging study Subjective:   HPI: Martin Garza is a 70 y.o. male presenting on 10/26/2020 for Results (Wants to discuss prostate issue from 07/13/20 abd/pelvis CT results. )   Ongoing low energy.  COVID infection end of 05/2020. Ongoing fatigue and dyspnea since then.  Reassuring recent echocardiogram through cardiology, pending stress test on Friday (Dr End).   Saw Dr Diona Browner earlier this year with new R flank pain and RLQ abdominal pain. Labwork reassuring, CT scan with non-acute findings which were reviewed with patient today as below. Dx constipation, did improve with miralax use.   Abnormal CT abd/pelvis with contrast 07/13/2020: 1. There are sigmoid diverticula without diverticulitis. No evident bowel obstruction. No abscess in the abdomen or pelvis. Appendix appears normal. 2. No evident renal or ureteral calculus on either side. No evident hydronephrosis. Urinary bladder wall thickness normal. 3. Mildly irregular contour along the superior aspect of the prostate. This finding warrants clinical assessment and PSA evaluation. Prostate not grossly enlarged. 4.  Hepatic steatosis.  Apparent liver hemangiomas. 5. There is a degree of spinal stenosis at L4-5 due to disc protrusion and bony hypertrophy. 6.  Aortic Atherosclerosis (ICD10-I70.0).  CT also showed HS with liver hemangiomas, lumbar spinal stenosis due to disc protrusion and bony hypertrophy, and aort ATH.   COLONOSCOPY 04/2018 - TA, SSP, diverticulosis, rpt 3 yrs ([facility]); with polypectomy  Lab Results  Component Value Date   PSA 1.33 02/13/2018   PSA 0.97 02/08/2017   PSA 0.96 02/01/2016     Notes ED trouble for years even prior to  starting cardiac meds. Libido still present. Notes nocturia x2, but also daytime frequency and urgency.   Notes worsening indigestion described as gassiness/bloating despite pepcid. He's been using antacid bottle but it causes constipation.   Non smoker.  No significant alcohol use.      Relevant past medical, surgical, family and social history reviewed and updated as indicated. Interim medical history since our last visit reviewed. Allergies and medications reviewed and updated. Outpatient Medications Prior to Visit  Medication Sig Dispense Refill  . acetaminophen (TYLENOL) 325 MG tablet Take 650 mg by mouth every 6 (six) hours as needed for moderate pain.     Marland Kitchen aspirin EC 81 MG EC tablet Take 1 tablet (81 mg total) by mouth daily. 30 tablet 1  . Cyanocobalamin (B-12 PO) Take 500 mcg by mouth daily.     Marland Kitchen ezetimibe (ZETIA) 10 MG tablet Take 1 tablet (10 mg total) by mouth daily. 90 tablet 3  . famotidine (PEPCID) 20 MG tablet TAKE 1 TABLET (20 MG TOTAL) BY MOUTH DAILY AS NEEDED FOR HEARTBURN OR INDIGESTION. 90 tablet 0  . nitroGLYCERIN (NITROSTAT) 0.4 MG SL tablet Place 1 tablet (0.4 mg total) under the tongue every 5 (five) minutes x 3 doses as needed for chest pain. 20 tablet 2  . rosuvastatin (CRESTOR) 40 MG tablet Take 1 tablet (40 mg total) by mouth daily. 90 tablet 3  . valsartan (DIOVAN) 160 MG tablet Take 1 tablet (160 mg total) by mouth daily. 90 tablet 3  .  vitamin C (ASCORBIC ACID) 500 MG tablet Take 1,000 mg by mouth daily.     No facility-administered medications prior to visit.     Per HPI unless specifically indicated in ROS section below Review of Systems Objective:  BP 136/60   Pulse 69   Temp (!) 97.5 F (36.4 C) (Temporal)   Ht 5\' 11"  (1.803 m)   Wt 213 lb 2 oz (96.7 kg)   SpO2 97%   BMI 29.72 kg/m   Wt Readings from Last 3 Encounters:  10/26/20 213 lb 2 oz (96.7 kg)  09/16/20 216 lb (98 kg)  07/08/20 211 lb 8 oz (95.9 kg)      Physical Exam Vitals and  nursing note reviewed.  Constitutional:      Appearance: Normal appearance. He is not ill-appearing.  HENT:     Head: Normocephalic.  Eyes:     Extraocular Movements: Extraocular movements intact.     Pupils: Pupils are equal, round, and reactive to light.  Cardiovascular:     Rate and Rhythm: Normal rate and regular rhythm.     Pulses: Normal pulses.     Heart sounds: Normal heart sounds. No murmur heard.   Pulmonary:     Effort: Pulmonary effort is normal. No respiratory distress.     Breath sounds: Normal breath sounds. No wheezing, rhonchi or rales.  Abdominal:     General: Bowel sounds are normal. There is no distension.     Palpations: Abdomen is soft. There is no mass.     Tenderness: There is no abdominal tenderness. There is no right CVA tenderness, left CVA tenderness, guarding or rebound. Negative signs include Murphy's sign.     Hernia: No hernia is present.  Genitourinary:    Prostate: Normal. Not enlarged (20gm), not tender and no nodules present.     Rectum: Normal. No mass, tenderness, anal fissure, external hemorrhoid or internal hemorrhoid. Normal anal tone.  Musculoskeletal:        General: Normal range of motion.     Right lower leg: No edema.     Left lower leg: No edema.     Comments: No midline or paraspinous mm back pain   Skin:    General: Skin is warm and dry.     Findings: No rash.  Neurological:     Mental Status: He is alert.  Psychiatric:        Mood and Affect: Mood normal.        Behavior: Behavior normal.       Results for orders placed or performed in visit on 10/26/20  POCT Urinalysis Dipstick (Automated)  Result Value Ref Range   Color, UA yellow    Clarity, UA clear    Glucose, UA Negative Negative   Bilirubin, UA negative    Ketones, UA negative    Spec Grav, UA 1.025 1.010 - 1.025   Blood, UA negative    pH, UA 6.0 5.0 - 8.0   Protein, UA Negative Negative   Urobilinogen, UA 2.0 (A) 0.2 or 1.0 E.U./dL   Nitrite, UA negative     Leukocytes, UA Negative Negative    Assessment & Plan:  This visit occurred during the SARS-CoV-2 public health emergency.  Safety protocols were in place, including screening questions prior to the visit, additional usage of staff PPE, and extensive cleaning of exam room while observing appropriate contact time as indicated for disinfecting solutions.   Problem List Items Addressed This Visit    Coronary artery disease  involving native coronary artery of native heart without angina pectoris    Appreciate cards care, followed by Dr End on aspirin, statin, zetia.       Essential hypertension    Chronic, stable period on valsartan 160mg  daily.       SOB (shortness of breath)    Ongoing since COVID.  Recent echo through cards reassuring.  Pending stress test.      Fatigue    ?post-COVID fatigue.  Currently undergoing workup through cardiology.       Prostate irregularity - Primary    Irregularity to superior prostate incidentally noted on abdominal/pelvic CT scan, although it specifically mentions prostate size within normal limits. Pt endorses some symptoms of nocturia, daytime frequency and urgency. Digital rectal exam overall reassuring today. No fmhx prostate cancer.  Will check UA and PSA and determine need for further eval pending results.       Relevant Orders   PSA, Total with Reflex to PSA, Free   POCT Urinalysis Dipstick (Automated) (Completed)   Atherosclerosis of aorta (HCC)    By CT - continue aspirin, statin, zetia.       Diverticulosis    Discussed with patient, as well as need to seek evaluation if lower abd pain lasting more than a few days with fever or bowel changes.       Hepatic steatosis    By imaging. Reviewed with patient. LFTs normal.       Spinal stenosis at L4-L5 level    By imaging study       Other Visit Diagnoses    Abnormal finding on imaging           No orders of the defined types were placed in this encounter.  Orders Placed  This Encounter  Procedures  . PSA, Total with Reflex to PSA, Free  . POCT Urinalysis Dipstick (Automated)   Patient Instructions  Lab today  Urinalysis today  We will be in touch with results and plan.  For indigestion - try gas-X (simethicone)     Follow up plan: Return if symptoms worsen or fail to improve.  Ria Bush, MD

## 2020-10-26 NOTE — Assessment & Plan Note (Signed)
Irregularity to superior prostate incidentally noted on abdominal/pelvic CT scan, although it specifically mentions prostate size within normal limits. Pt endorses some symptoms of nocturia, daytime frequency and urgency. Digital rectal exam overall reassuring today. No fmhx prostate cancer.  Will check UA and PSA and determine need for further eval pending results.

## 2020-10-26 NOTE — Assessment & Plan Note (Signed)
By imaging. Reviewed with patient. LFTs normal.

## 2020-10-26 NOTE — Assessment & Plan Note (Signed)
?  post-COVID fatigue.  Currently undergoing workup through cardiology.

## 2020-10-26 NOTE — Assessment & Plan Note (Signed)
By imaging study

## 2020-10-26 NOTE — Assessment & Plan Note (Signed)
Discussed with patient, as well as need to seek evaluation if lower abd pain lasting more than a few days with fever or bowel changes.

## 2020-10-26 NOTE — Assessment & Plan Note (Signed)
Ongoing since COVID.  Recent echo through cards reassuring.  Pending stress test.

## 2020-10-27 LAB — PSA, TOTAL WITH REFLEX TO PSA, FREE: PSA, Total: 1.2 ng/mL (ref <=4.0)

## 2020-11-04 ENCOUNTER — Ambulatory Visit
Admission: RE | Admit: 2020-11-04 | Discharge: 2020-11-04 | Disposition: A | Discharge status: Home / Self Care | Payer: PPO | Source: Ambulatory Visit | Attending: Internal Medicine | Admitting: Internal Medicine

## 2020-11-04 ENCOUNTER — Other Ambulatory Visit: Payer: Self-pay

## 2020-11-04 DIAGNOSIS — R0600 Dyspnea, unspecified: Secondary | ICD-10-CM | POA: Diagnosis not present

## 2020-11-04 DIAGNOSIS — I208 Other forms of angina pectoris: Secondary | ICD-10-CM | POA: Insufficient documentation

## 2020-11-04 DIAGNOSIS — R0609 Other forms of dyspnea: Secondary | ICD-10-CM

## 2020-11-04 LAB — NM MYOCAR MULTI W/SPECT W/WALL MOTION / EF
Estimated workload: 7 METS
Exercise duration (min): 6 min
Exercise duration (sec): 0 s
LV dias vol: 58 mL (ref 62–150)
LV sys vol: 18 mL
MPHR: 149 {beats}/min
Peak HR: 136 {beats}/min
Percent HR: 91 %
Rest HR: 48 {beats}/min
SDS: 3
SRS: 8
SSS: 5
TID: 0.72

## 2020-11-04 MED ORDER — TECHNETIUM TC 99M TETROFOSMIN IV KIT
9.7700 | PACK | Freq: Once | INTRAVENOUS | Status: AC | PRN
  Administered 2020-11-04: 9.77 via INTRAVENOUS

## 2020-11-04 MED ORDER — TECHNETIUM TC 99M TETROFOSMIN IV KIT
30.8300 | PACK | Freq: Once | INTRAVENOUS | Status: AC | PRN
  Administered 2020-11-04: 30.83 via INTRAVENOUS

## 2020-11-04 MED ORDER — REGADENOSON 0.4 MG/5ML IV SOLN
0.4000 mg | Freq: Once | INTRAVENOUS | Status: AC
  Administered 2020-11-04: 0.4 mg via INTRAVENOUS

## 2020-11-14 NOTE — Progress Notes (Signed)
Follow-up Outpatient Visit Date: 11/16/2020  Primary Care Provider: Ria Bush, [place] [id number]  Chief Complaint: Dyspnea on exertion  HPI:  Martin Garza is a 70 y.o. male with history of coronary artery disease status post PCI to proximal LAD in the setting of NSTEMI (complicated by loss of flow in the distal LAD), hypertension, hyperlipidemia, and ischemic cardiomyopathy, who presents for follow-up of lightheadedness and fatigue.  I last saw him in late April, at which time Martin Garza complained of two episodes of significant lightheadedness with near-syncope.  He also reported increasing exertional dyspnea and fatigue over the last year, prompting [place] to obtain an myocardial perfusion stress test.  This showed apical scar without significant ischemia.  Echo showed normal LVEF with indeterminate diastolic function and no significant valvular abnormality.  Today, Martin Garza reports that he feels about the same as at our last visit.  He still complains of exertional dyspnea with modest activity.  He believes this began after he had COVID-19 in January.  He notes that he felt short of breath prior to his MI in 2020.  He has not had any chest pain, palpitations, lightheadedness, orthopnea, or edema.  He denies bleeding.  He is compliant with his medications.  --------------------------------------------------------------------------------------------------  Past Medical History:  Diagnosis Date   Allergy    CAD (coronary artery disease)    a. 10/2018 NSTEMI/PCI: LM nl, LAD 95p (3.0x12 Resolute Onyx DES), 6m, 85d (2.0x18 Resolute Onyx DES), LCX nl, RCA large, 20ost, 30d.   Cataract    had surg in both eyes   Complication of anesthesia    Blood pressure goes up after waking up past sedation/ gets dizzy too per pt!   Essential hypertension    Hyperlipidemia LDL goal <70    Ischemic cardiomyopathy    a. 10/2018 Echo: EF 55-60%, sev apical septal, apical, and  ant HK. DD. Nl RV fxn.   Medical history non-contributory    Retinal detachment 8502   left   Umbilical hernia    Past Surgical History:  Procedure Laterality Date   CARDIAC CATHETERIZATION     CATARACT EXTRACTION  2018   Bil   COLONOSCOPY  04/2018   TA, SSP, diverticulosis, rpt 3 yrs ([facility]); with polypectom y   CORONARY STENT INTERVENTION N/A 11/10/2018   Procedure: CORONARY STENT INTERVENTION;  Surgeon: Nelva Bush, MD;  Location: [facility];  Service: Cardiovascular;  Laterality: N/A;   LEFT HEART CATH AND CORONARY ANGIOGRAPHY N/A 11/10/2018   Procedure: LEFT HEART CATH AND CORONARY ANGIOGRAPHY;  Surgeon: Nelva Bush, MD;  Location: [facility];  Service: Cardiovascular;  Laterality: N/A;   Left inguinal repair  in [place] Left    x 2 in [id number]   [place] N/A 05/30/2018   Procedure: LAPAROSCOPIC ASSISTED  UMBILICAL HERNIA REPAIR WITH MESH,  ERAS PATHWAY;  Surgeon: Johnathan Hausen, MD;  Location: [facility];  Service: General;  Laterality: N/A;    Current Meds  Medication Sig   acetaminophen (TYLENOL) 325 MG tablet Take 650 mg by mouth every 6 (six) hours as needed for moderate pain.    aspirin EC 81 MG EC tablet Take 1 tablet (81 mg total) by mouth daily.   Cyanocobalamin (B-12 PO) Take 500 mcg by mouth daily.    ezetimibe (ZETIA) 10 MG tablet Take 1 tablet (10 mg total) by mouth daily.   famotidine (PEPCID) 20 MG tablet TAKE 1  TABLET (20 MG TOTAL) BY MOUTH DAILY AS NEEDED FOR HEARTBURN OR INDIGESTION.   nitroGLYCERIN (NITROSTAT) 0.4 MG SL tablet Place 1 tablet (0.4 mg total) under the tongue every 5 (five) minutes x 3 doses as needed for chest pain.   rosuvastatin (CRESTOR) 40 MG tablet Take 1 tablet (40 mg total) by mouth daily.   valsartan (DIOVAN) 160 MG tablet Take 1 tablet (160 mg total) by mouth daily.   vitamin C (ASCORBIC ACID) 500 MG tablet Take 1,000 mg by mouth daily.    Allergies:  Lisinopril  Social History   Tobacco Use   Smoking status: Never   Smokeless tobacco: Former    Types: Chew    Quit date: 05/21/1986  Vaping Use   Vaping Use: Never used  Substance Use Topics   Alcohol use: Not Currently    Alcohol/week: 1.0 standard drink    Types: 1 Glasses of wine per week    Comment: rare   Drug use: No    Family History  Problem Relation Age of Onset   Cancer Mother 5       metastatic cancer, smoker   Coronary artery disease Father 48       MI with CABG, smoker   Stroke Father        after catheterization   Alcohol abuse Father    Alcohol abuse Brother    Coronary artery disease Brother 43       CABG, smoker   Depression Brother    Drug abuse Brother    Hearing loss Daughter    Cancer Maternal Aunt        breast   Breast cancer Maternal Aunt    Diabetes Neg Hx    Colon cancer Neg Hx    Rectal cancer Neg Hx    Stomach cancer Neg Hx     Review of Systems: A 12-system review of systems was performed and was negative except as noted in the HPI.  --------------------------------------------------------------------------------------------------  Physical Exam: BP 104/74 (BP Location: Left Arm, Patient Position: Sitting, Cuff Size: Large)   Pulse 68   Ht 5\' 11"  (1.803 m)   Wt 210 lb (95.3 kg)   SpO2 98%   BMI 29.29 kg/m   General:  NAD. Neck: No JVD or HJR. Lungs: Clear to auscultation bilaterally without wheezes or crackles. Heart: Regular rate and rhythm without murmurs, rubs, or gallops. Abdomen: Soft, nontender, nondistended. Extremities: No lower extremity edema.  Lab Results  Component Value Date   WBC 6.3 07/08/2020   HGB 13.9 07/08/2020   HCT 41.0 07/08/2020   MCV 95.4 07/08/2020   PLT 214.0 07/08/2020    Lab Results  Component Value Date   NA 140 07/08/2020   K 4.3 07/08/2020   CL 105 07/08/2020   CO2 29 07/08/2020   BUN 16 07/08/2020   CREATININE 1.15 07/08/2020   GLUCOSE 109 (H) 07/08/2020   ALT 33 07/08/2020     Lab Results  Component Value Date   CHOL 116 12/30/2019   HDL 49 12/30/2019   LDLCALC 59 12/30/2019   LDLDIRECT 160.0 02/13/2018   TRIG 38 12/30/2019   CHOLHDL 2.4 12/30/2019    --------------------------------------------------------------------------------------------------  ASSESSMENT AND PLAN: Dyspnea on exertion and coronary artery disease: This has been present for several months, beginning around the time that Martin Garza had COVID-19 in January.  Echocardiogram and myocardial perfusion stress test since our last visit were both reassuring with evidence of prior apical infarct but no new abnormality.  It  is possible that his exertional dyspnea is due to deconditioning.  I have encouraged Martin Garza to try to increase his activity.  If he does not notice any improvement, we will need to consider further cardiac testing including right and left heart catheterization, as well as pulmonary consultation.  I will check a CBC and CMP as well as TSH today.  Continue aspirin, rosuvastatin, and ezetimibe for secondary prevention.  Hyperlipidemia: Lipids well controlled on last check a year ago.  We will repeat a CMP and fasting lipid panel today with plans to continue rosuvastatin and ezetimibe for target LDL less than 70.  Hypertension: Blood pressure well controlled today.  Continue valsartan 160 mg daily.  Nelva Bush, MD 11/16/2020 8:15 AM

## 2020-11-16 ENCOUNTER — Ambulatory Visit (INDEPENDENT_AMBULATORY_CARE_PROVIDER_SITE_OTHER): Payer: PPO | Admitting: Internal Medicine

## 2020-11-16 ENCOUNTER — Other Ambulatory Visit: Payer: Self-pay

## 2020-11-16 ENCOUNTER — Encounter: Payer: Self-pay | Admitting: Internal Medicine

## 2020-11-16 VITALS — BP 104/74 | HR 68 | Ht 71.0 in | Wt 210.0 lb

## 2020-11-16 DIAGNOSIS — Z79899 Other long term (current) drug therapy: Secondary | ICD-10-CM | POA: Diagnosis not present

## 2020-11-16 DIAGNOSIS — I2510 Atherosclerotic heart disease of native coronary artery without angina pectoris: Secondary | ICD-10-CM

## 2020-11-16 DIAGNOSIS — E785 Hyperlipidemia, unspecified: Secondary | ICD-10-CM

## 2020-11-16 DIAGNOSIS — R0600 Dyspnea, unspecified: Secondary | ICD-10-CM | POA: Diagnosis not present

## 2020-11-16 DIAGNOSIS — I10 Essential (primary) hypertension: Secondary | ICD-10-CM

## 2020-11-16 DIAGNOSIS — R0609 Other forms of dyspnea: Secondary | ICD-10-CM

## 2020-11-16 NOTE — Patient Instructions (Signed)
Medication Instructions:  Your physician recommends that you continue on your current medications as directed. Please refer to the Current Medication list given to you today.  *If you need a refill on your cardiac medications before your next appointment, please call your pharmacy*   Lab Work:  Today: CBC, CMET, Lipid panel, TSH  If you have labs (blood work) drawn today and your tests are completely normal, you will receive your results only by: [place] (if you have MyChart) OR A paper copy in the mail If you have any lab test that is abnormal or we need to change your treatment, we will call you to review the results.   Testing/Procedures:  None ordered   Follow-Up: At [facility], you and your health needs are our priority.  As part of our continuing mission to provide you with exceptional heart care, we have created designated Provider Care Teams.  These Care Teams include your primary Cardiologist (physician) and Advanced Practice Providers (APPs -  Physician Assistants and Nurse Practitioners) who all work together to provide you with the care you need, when you need it.  We recommend signing up for the patient portal called "MyChart".  Sign up information is provided on this After Visit Summary.  MyChart is used to connect with patients for Virtual Visits (Telemedicine).  Patients are able to view lab/test results, encounter notes, upcoming appointments, etc.  Non-urgent messages can be sent to your provider as well.   To learn more about what you can do with MyChart, go to [email or web address].    Your next appointment:   6 month(s)  The format for your next appointment:   In Person  Provider:   You may see Nelva Bush, MD or one of the following Advanced Practice Providers on your designated Care Team:   Murray Hodgkins, NP Christell Faith, PA-C Marrianne Mood, PA-C Cadence [place], [place] Laurann Montana, NP

## 2020-11-17 LAB — COMPREHENSIVE METABOLIC PANEL
ALT: 37 IU/L (ref 0–44)
AST: 35 IU/L (ref 0–40)
Albumin/Globulin Ratio: 2.2 (ref 1.2–2.2)
Albumin: 4.7 g/dL (ref 3.7–4.7)
Alkaline Phosphatase: 50 IU/L (ref 44–121)
BUN/Creatinine Ratio: 13 (ref 10–24)
BUN: 15 mg/dL (ref 8–27)
Bilirubin Total: 0.6 mg/dL (ref 0.0–1.2)
CO2: 21 mmol/L (ref 20–29)
Calcium: 9.4 mg/dL (ref 8.6–10.2)
Chloride: 102 mmol/L (ref 96–106)
Creatinine, Ser: 1.14 mg/dL (ref 0.76–1.27)
Globulin, Total: 2.1 g/dL (ref 1.5–4.5)
Glucose: 104 mg/dL — ABNORMAL HIGH (ref 65–99)
Potassium: 4.4 mmol/L (ref 3.5–5.2)
Sodium: 140 mmol/L (ref 134–144)
Total Protein: 6.8 g/dL (ref 6.0–8.5)
eGFR: 69 mL/min/{1.73_m2} (ref >59)

## 2020-11-17 LAB — LIPID PANEL
Chol/HDL Ratio: 2.7 ratio (ref 0.0–5.0)
Cholesterol, Total: 126 mg/dL (ref 100–199)
HDL: 46 mg/dL (ref >39)
LDL Chol Calc (NIH): 61 mg/dL (ref 0–99)
Triglycerides: 102 mg/dL (ref 0–149)
VLDL Cholesterol Cal: 19 mg/dL (ref 5–40)

## 2020-11-17 LAB — CBC
Hematocrit: 42.5 % (ref 37.5–51.0)
Hemoglobin: 14.8 g/dL (ref 13.0–17.7)
MCH: 32.7 pg (ref 26.6–33.0)
MCHC: 34.8 g/dL (ref 31.5–35.7)
MCV: 94 fL (ref 79–97)
Platelets: 200 10*3/uL (ref 150–450)
RBC: 4.52 x10E6/uL (ref 4.14–5.80)
RDW: 12.1 % (ref 11.6–15.4)
WBC: 6 10*3/uL (ref 3.4–10.8)

## 2020-11-17 LAB — TSH: TSH: 1.17 u[IU]/mL (ref 0.450–4.500)

## 2021-01-27 ENCOUNTER — Telehealth: Payer: Self-pay | Admitting: Family Medicine

## 2021-01-27 NOTE — Telephone Encounter (Signed)
LVM for pt to rtn my call to r/s appt with NHA on 02/28/21

## 2021-02-26 ENCOUNTER — Other Ambulatory Visit: Payer: Self-pay | Admitting: Family Medicine

## 2021-02-26 DIAGNOSIS — K760 Fatty (change of) liver, not elsewhere classified: Secondary | ICD-10-CM

## 2021-02-26 DIAGNOSIS — E785 Hyperlipidemia, unspecified: Secondary | ICD-10-CM

## 2021-02-26 DIAGNOSIS — N429 Disorder of prostate, unspecified: Secondary | ICD-10-CM

## 2021-02-28 ENCOUNTER — Ambulatory Visit: Payer: PPO

## 2021-03-01 ENCOUNTER — Other Ambulatory Visit: Payer: PPO

## 2021-03-07 ENCOUNTER — Encounter: Payer: Self-pay | Admitting: Family Medicine

## 2021-03-07 ENCOUNTER — Other Ambulatory Visit: Payer: Self-pay

## 2021-03-07 ENCOUNTER — Ambulatory Visit (INDEPENDENT_AMBULATORY_CARE_PROVIDER_SITE_OTHER): Payer: PPO | Admitting: Family Medicine

## 2021-03-07 VITALS — BP 128/70 | HR 78 | Temp 97.4°F | Ht 69.25 in | Wt 209.5 lb

## 2021-03-07 DIAGNOSIS — Z0000 Encounter for general adult medical examination without abnormal findings: Secondary | ICD-10-CM

## 2021-03-07 DIAGNOSIS — N429 Disorder of prostate, unspecified: Secondary | ICD-10-CM | POA: Diagnosis not present

## 2021-03-07 DIAGNOSIS — Z7189 Other specified counseling: Secondary | ICD-10-CM

## 2021-03-07 DIAGNOSIS — E785 Hyperlipidemia, unspecified: Secondary | ICD-10-CM

## 2021-03-07 DIAGNOSIS — I2510 Atherosclerotic heart disease of native coronary artery without angina pectoris: Secondary | ICD-10-CM

## 2021-03-07 DIAGNOSIS — I10 Essential (primary) hypertension: Secondary | ICD-10-CM

## 2021-03-07 DIAGNOSIS — I255 Ischemic cardiomyopathy: Secondary | ICD-10-CM

## 2021-03-07 DIAGNOSIS — I700 Atherosclerosis of aorta: Secondary | ICD-10-CM

## 2021-03-07 LAB — BASIC METABOLIC PANEL
BUN: 17 mg/dL (ref 6–23)
CO2: 26 mEq/L (ref 19–32)
Calcium: 9.3 mg/dL (ref 8.4–10.5)
Chloride: 106 mEq/L (ref 96–112)
Creatinine, Ser: 1.13 mg/dL (ref 0.40–1.50)
GFR: 65.09 mL/min (ref >60.00)
Glucose, Bld: 103 mg/dL — ABNORMAL HIGH (ref 70–99)
Potassium: 4.3 mEq/L (ref 3.5–5.1)
Sodium: 141 mEq/L (ref 135–145)

## 2021-03-07 LAB — PSA: PSA: 1.15 ng/mL (ref 0.10–4.00)

## 2021-03-07 MED ORDER — NITROGLYCERIN 0.4 MG SL SUBL: 0.4000 mg | SUBLINGUAL_TABLET | SUBLINGUAL | 2 refills | Status: AC | PRN

## 2021-03-07 MED ORDER — VITAMIN C 500 MG PO TABS: 500.0000 mg | ORAL_TABLET | ORAL | Status: DC

## 2021-03-07 NOTE — Assessment & Plan Note (Signed)
Preventative protocols reviewed and updated unless pt declined. Discussed healthy diet and lifestyle.  

## 2021-03-07 NOTE — Assessment & Plan Note (Signed)
Continue aspirin, statin.  

## 2021-03-07 NOTE — Assessment & Plan Note (Signed)
Chronic, stable on crestor 40mg  daily and zetia. Followed by cardiology. Recent FLP stable.  The ASCVD Risk score (Arnett DK, et al., 2019) failed to calculate for the following reasons:   The patient has a prior MI or stroke diagnosis

## 2021-03-07 NOTE — Assessment & Plan Note (Signed)
Appreciate cards care on crestor, zetia, aspirin 81mg  daily.

## 2021-03-07 NOTE — Patient Instructions (Addendum)
Labs today  Advanced directive packet provided today. Check at home and if you have one, bring [place] a copy You are due for repeat colonoscopy 04/2021.  Exclude gas producing foods (beans, onions, celery, carrots, raisins, bananas, apricots, prunes, brussel sprouts, wheat germ, pretzels). May try gas X as needed. Continue pepcid as needed.  Consider trail of lactose free diet (back off milk) Good to see you today.   Health Maintenance After Age 72 After age 68, you are at a higher risk for certain long-term diseases and infections as well as injuries from falls. Falls are a major cause of broken bones and head injuries in people who are older than age 76. Getting regular preventive care can help to keep you healthy and well. Preventive care includes getting regular testing and making lifestyle changes as recommended by your health care provider. Talk with your health care provider about: Which screenings and tests you should have. A screening is a test that checks for a disease when you have no symptoms. A diet and exercise plan that is right for you. What should I know about screenings and tests to prevent falls? Screening and testing are the best ways to find a health problem early. Early diagnosis and treatment give you the best chance of managing medical conditions that are common after age 36. Certain conditions and lifestyle choices may make you more likely to have a fall. Your health care provider may recommend: Regular vision checks. Poor vision and conditions such as cataracts can make you more likely to have a fall. If you wear glasses, make sure to get your prescription updated if your vision changes. Medicine review. Work with your health care provider to regularly review all of the medicines you are taking, including over-the-counter medicines. Ask your health care provider about any side effects that may make you more likely to have a fall. Tell your health care provider if any medicines  that you take make you feel dizzy or sleepy. Osteoporosis screening. Osteoporosis is a condition that causes the bones to get weaker. This can make the bones weak and cause them to break more easily. Blood pressure screening. Blood pressure changes and medicines to control blood pressure can make you feel dizzy. Strength and balance checks. Your health care provider may recommend certain tests to check your strength and balance while standing, walking, or changing positions. Foot health exam. Foot pain and numbness, as well as not wearing proper footwear, can make you more likely to have a fall. Depression screening. You may be more likely to have a fall if you have a fear of falling, feel emotionally low, or feel unable to do activities that you used to do. Alcohol use screening. Using too much alcohol can affect your balance and may make you more likely to have a fall. What actions can I take to lower my risk of falls? General instructions Talk with your health care provider about your risks for falling. Tell your health care provider if: You fall. Be sure to tell your health care provider about all falls, even ones that seem minor. You feel dizzy, sleepy, or off-balance. Take over-the-counter and prescription medicines only as told by your health care provider. These include any supplements. Eat a healthy diet and maintain a healthy weight. A healthy diet includes low-fat dairy products, low-fat (lean) meats, and fiber from whole grains, beans, and lots of fruits and vegetables. Home safety Remove any tripping hazards, such as rugs, cords, and clutter. Install safety equipment  such as grab bars in bathrooms and safety rails on stairs. Keep rooms and walkways well-lit. Activity  Follow a regular exercise program to stay fit. This will help you maintain your balance. Ask your health care provider what types of exercise are appropriate for you. If you need a cane or walker, use it as recommended  by your health care provider. Wear supportive shoes that have nonskid soles. Lifestyle Do not drink alcohol if your health care provider tells you not to drink. If you drink alcohol, limit how much you have: 0-1 drink a day for women. 0-2 drinks a day for men. Be aware of how much alcohol is in your drink. In the [place] one drink equals one typical bottle of beer (12 oz), one-half glass of wine (5 oz), or one shot of hard liquor (1 oz). Do not use any products that contain nicotine or tobacco, such as cigarettes and e-cigarettes. If you need help quitting, ask your health care provider. Summary Having a healthy lifestyle and getting preventive care can help to protect your health and wellness after age 62. Screening and testing are the best way to find a health problem early and help you avoid having a fall. Early diagnosis and treatment give you the best chance for managing medical conditions that are more common for people who are older than age 25. Falls are a major cause of broken bones and head injuries in people who are older than age 77. Take precautions to prevent a fall at home. Work with your health care provider to learn what changes you can make to improve your health and wellness and to prevent falls. This information is not intended to replace advice given to you by your health care provider. Make sure you discuss any questions you have with your health care provider. Document Revised: 07/15/2020 Document Reviewed: 04/22/2020 Elsevier Patient Education  2022 [employer].

## 2021-03-07 NOTE — Assessment & Plan Note (Signed)

## 2021-03-07 NOTE — Assessment & Plan Note (Addendum)
Advanced directives - would want wife to be HCPOA. Thinks this is set up at home. Packet provided today

## 2021-03-07 NOTE — Assessment & Plan Note (Addendum)
Pt with mild symptoms of nocturia, PSA stable, DRE reassuring. Continue yearly check for next several years.

## 2021-03-07 NOTE — Progress Notes (Signed)
Patient ID: Martin Garza, male    DOB: 1951/02/02, 70 y.o.   MRN: [id number]  This visit was conducted in person.  BP 128/70   Pulse 78   Temp (!) 97.4 F (36.3 C) (Temporal)   Ht 5' 9.25" (1.759 m)   Wt 209 lb 8 oz (95 kg)   SpO2 98%   BMI 30.71 kg/m    CC: AMW/CPE Subjective:   HPI: Martin Garza is a 70 y.o. male presenting on 03/07/2021 for Medicare Wellness   Did not see health advisor this year.   Hearing Screening   _0  _1  _2  _3   Right ear 40 40 20 0  Left ear _4 0  Comments: Per pt, wife says his hearing is worsening.   Vision Screening - Comments:: Last eye exam, 08/2020.  Martin Garza Office Visit from 03/07/2021 in [facility]  PHQ-2 Total Score 0     Discussed audiology referral - declines  Fall Risk  03/07/2021 02/13/2018 02/08/2017 02/01/2016 01/31/2015  Falls in the past year? 0 No No No No   See prior note for details.  Notes ongoing GI upset with significant gassiness/belching, pepcid helps. Pepto bismol causes constipation. Has tried limiting dairy with some benefit.   Preventative: COLONOSCOPY 04/2018 - TA, SSP, diverticulosis, rpt 3 yrs ([facility]); with polypectomy. Discussed will be due for rpt 04/2021.  Prostate cancer screening - abnormal imaging study with normal DRE and PSA - rec yearly screen to ensure no changes. Denies prostate symptoms. Nocturia x2. CT scan 2022 with irregularity to superior prostate however not enlarged.  Lung cancer screening - not eligible  Flu shot - declines  COVID vaccine - Pfizer 06/2019, 07/2019, booster 05/2020   Prevna-13 92016, pneumovax 01/2016 Tdap 08/2011  Shingrix - discussed, declines  Advanced directives - would want wife to be HCPOA. Thinks this is set up at home.  Seat belt use discussed Sunscreen use discussed, no suspicious moles Non smoker  Alcohol - seldom  Dentist Q6 mo Eye exam regularly  Bowel - no constipation  Bladder - no incontinence    Caffeine: 3 cups coffee/day, some unsweet tea  Lives with wife, 1 dog. one grown daughter  Occupation: Adult nurse  Activity: no regular exercise - walks 3 mi/day at work  Diet: some water, fruits/vegetables daily, avoids fried foods and simple carbs       Relevant past medical, surgical, family and social history reviewed and updated as indicated. Interim medical history since our last visit reviewed. Allergies and medications reviewed and updated. Outpatient Medications Prior to Visit  Medication Sig Dispense Refill   aspirin EC 81 MG EC tablet Take 1 tablet (81 mg total) by mouth daily. 30 tablet 1   Cyanocobalamin (B-12 PO) Take 500 mcg by mouth daily.      ezetimibe (ZETIA) 10 MG tablet Take 1 tablet (10 mg total) by mouth daily. 90 tablet 3   famotidine (PEPCID) 20 MG tablet TAKE 1 TABLET (20 MG TOTAL) BY MOUTH DAILY AS NEEDED FOR HEARTBURN OR INDIGESTION. 90 tablet 0   rosuvastatin (CRESTOR) 40 MG tablet Take 1 tablet (40 mg total) by mouth daily. 90 tablet 3   valsartan (DIOVAN) 160 MG tablet Take 1 tablet (160 mg total) by mouth daily. 90 tablet 3   nitroGLYCERIN (NITROSTAT) 0.4 MG SL tablet Place 1 tablet (0.4 mg total) under the tongue every 5 (five) minutes x 3 doses as needed for chest pain. 20 tablet 2  vitamin C (ASCORBIC ACID) 500 MG tablet Take 1,000 mg by mouth daily.     acetaminophen (TYLENOL) 325 MG tablet Take 650 mg by mouth every 6 (six) hours as needed for moderate pain.      No facility-administered medications prior to visit.     Per HPI unless specifically indicated in ROS section below Review of Systems  Constitutional:  Negative for activity change, appetite change, chills, fatigue, fever and unexpected weight change.  HENT:  Negative for hearing loss.   Eyes:  Negative for visual disturbance.  Respiratory:  Negative for cough, chest tightness, shortness of breath and wheezing.   Cardiovascular:  Negative for chest pain, palpitations and leg  swelling.  Gastrointestinal:  Negative for abdominal distention, abdominal pain, blood in stool, constipation, diarrhea, nausea and vomiting.  Genitourinary:  Negative for difficulty urinating and hematuria.  Musculoskeletal:  Negative for arthralgias, myalgias and neck pain.  Skin:  Negative for rash.  Neurological:  Negative for dizziness, seizures, syncope and headaches.  Hematological:  Negative for adenopathy. Does not bruise/bleed easily.  Psychiatric/Behavioral:  Negative for dysphoric mood. The patient is not nervous/anxious.    Objective:  BP 128/70   Pulse 78   Temp (!) 97.4 F (36.3 C) (Temporal)   Ht 5' 9.25" (1.759 m)   Wt 209 lb 8 oz (95 kg)   SpO2 98%   BMI 30.71 kg/m   Wt Readings from Last 3 Encounters:  03/07/21 209 lb 8 oz (95 kg)  11/16/20 210 lb (95.3 kg)  10/26/20 213 lb 2 oz (96.7 kg)      Physical Exam Vitals and nursing note reviewed.  Constitutional:      General: He is not in acute distress.    Appearance: Normal appearance. He is well-developed. He is not ill-appearing.  HENT:     Head: Normocephalic and atraumatic.     Right Ear: Hearing, tympanic membrane, ear canal and external ear normal.     Left Ear: Hearing, tympanic membrane, ear canal and external ear normal.  Eyes:     General: No scleral icterus.    Extraocular Movements: Extraocular movements intact.     Conjunctiva/sclera: Conjunctivae normal.     Pupils: Pupils are equal, round, and reactive to light.  Neck:     Thyroid: No thyroid mass or thyromegaly.     Vascular: No carotid bruit.  Cardiovascular:     Rate and Rhythm: Normal rate and regular rhythm.     Pulses: Normal pulses.          Radial pulses are 2+ on the right side and 2+ on the left side.     Heart sounds: Normal heart sounds. No murmur heard. Pulmonary:     Effort: Pulmonary effort is normal. No respiratory distress.     Breath sounds: Normal breath sounds. No wheezing, rhonchi or rales.  Abdominal:     General:  Bowel sounds are normal. There is no distension.     Palpations: Abdomen is soft. There is no mass.     Tenderness: There is no abdominal tenderness. There is no guarding or rebound.     Hernia: No hernia is present.  Genitourinary:    Prostate: Normal. Not enlarged (20gm), not tender and no nodules present.     Rectum: Normal. No mass, tenderness, anal fissure, external hemorrhoid or internal hemorrhoid. Normal anal tone.  Musculoskeletal:        General: Normal range of motion.     Cervical back: Normal range of  motion and neck supple.     Right lower leg: No edema.     Left lower leg: No edema.  Lymphadenopathy:     Cervical: No cervical adenopathy.  Skin:    General: Skin is warm and dry.     Findings: No rash.  Neurological:     General: No focal deficit present.     Mental Status: He is alert and oriented to person, place, and time.     Comments:  Recall 2/3, 2/3 with cue Calculation 5/5 DLROW  Psychiatric:        Mood and Affect: Mood normal.        Behavior: Behavior normal.        Thought Content: Thought content normal.        Judgment: Judgment normal.      Results for orders placed or performed in visit on 11/16/20  CBC  Result Value Ref Range   WBC 6.0 3.4 - 10.8 x10E3/uL   RBC 4.52 4.14 - 5.80 x10E6/uL   Hemoglobin 14.8 13.0 - 17.7 g/dL   Hematocrit 42.5 37.5 - 51.0 %   MCV 94 79 - 97 fL   MCH 32.7 26.6 - 33.0 pg   MCHC 34.8 31.5 - 35.7 g/dL   RDW 12.1 11.6 - 15.4 %   Platelets 200 150 - 450 x10E3/uL  Comp Met (CMET)  Result Value Ref Range   Glucose 104 (H) 65 - 99 mg/dL   BUN 15 8 - 27 mg/dL   Creatinine, Ser 1.14 0.76 - 1.27 mg/dL   eGFR 69 >59 mL/min/1.73   BUN/Creatinine Ratio 13 10 - 24   Sodium 140 134 - 144 mmol/L   Potassium 4.4 3.5 - 5.2 mmol/L   Chloride 102 96 - 106 mmol/L   CO2 21 20 - 29 mmol/L   Calcium 9.4 8.6 - 10.2 mg/dL   Total Protein 6.8 6.0 - 8.5 g/dL   Albumin 4.7 3.7 - 4.7 g/dL   Globulin, Total 2.1 1.5 - 4.5 g/dL    Albumin/Globulin Ratio 2.2 1.2 - 2.2   Bilirubin Total 0.6 0.0 - 1.2 mg/dL   Alkaline Phosphatase 50 44 - 121 IU/L   AST 35 0 - 40 IU/L   ALT 37 0 - 44 IU/L  Lipid Profile  Result Value Ref Range   Cholesterol, Total 126 100 - 199 mg/dL   Triglycerides 102 0 - 149 mg/dL   HDL 46 >39 mg/dL   VLDL Cholesterol Cal 19 5 - 40 mg/dL   LDL Chol Calc (NIH) 61 0 - 99 mg/dL   Chol/HDL Ratio 2.7 0.0 - 5.0 ratio  TSH  Result Value Ref Range   TSH 1.170 0.450 - 4.500 uIU/mL    Assessment & Plan:  This visit occurred during the SARS-CoV-2 public health emergency.  Safety protocols were in place, including screening questions prior to the visit, additional usage of staff PPE, and extensive cleaning of exam room while observing appropriate contact time as indicated for disinfecting solutions.   Problem List Items Addressed This Visit     Medicare annual wellness visit, initial - Primary (Chronic)    I have personally reviewed the Medicare Annual Wellness questionnaire and have noted 1. The patient's medical and social history 2. Their use of alcohol, tobacco or illicit drugs 3. Their current medications and supplements 4. The patient's functional ability including ADL's, fall risks, home safety risks and hearing or visual impairment. Cognitive function has been assessed and addressed as indicated.  5.  Diet and physical activity 6. Evidence for depression or mood disorders The patients weight, height, BMI have been recorded in the chart. I have made referrals, counseling and provided education to the patient based on review of the above and I have provided the pt with a written personalized care plan for preventive services. Provider list updated.. See scanned questionairre as needed for further documentation. Reviewed preventative protocols and updated unless pt declined.       Advanced care planning/counseling discussion (Chronic)    Advanced directives - would want wife to be HCPOA. Thinks  this is set up at home. Packet provided today      Health maintenance examination (Chronic)    Preventative protocols reviewed and updated unless pt declined. Discussed healthy diet and lifestyle.       Essential hypertension   Relevant Medications   nitroGLYCERIN (NITROSTAT) 0.4 MG SL tablet   Other Relevant Orders   Basic metabolic panel   Prostate irregularity   Relevant Orders   PSA     Meds ordered this encounter  Medications   nitroGLYCERIN (NITROSTAT) 0.4 MG SL tablet    Sig: Place 1 tablet (0.4 mg total) under the tongue every 5 (five) minutes x 3 doses as needed for chest pain.    Dispense:  20 tablet    Refill:  2   vitamin C (ASCORBIC ACID) 500 MG tablet    Sig: Take 1 tablet (500 mg total) by mouth every Monday, Wednesday, and Friday.    Orders Placed This Encounter  Procedures   Basic metabolic panel   PSA     Patient instructions: Labs today  Advanced directive packet provided today. Check at home and if you have one, bring [place] a copy You are due for repeat colonoscopy 04/2021.  Exclude gas producing foods (beans, onions, celery, carrots, raisins, bananas, apricots, prunes, brussel sprouts, wheat germ, pretzels). May try gas X as needed. Continue pepcid as needed.  Consider trail of lactose free diet (back off milk) Good to see you today.   Follow up plan: Return in about 1 year (around 03/07/2022), or if symptoms worsen or fail to improve, for annual exam, prior fasting for blood work, medicare wellness visit.  Ria Bush, MD

## 2021-03-07 NOTE — Assessment & Plan Note (Signed)
Chronic, stable period on valsartan - continue this.

## 2021-03-20 ENCOUNTER — Other Ambulatory Visit: Payer: Self-pay | Admitting: Internal Medicine

## 2021-03-20 DIAGNOSIS — E785 Hyperlipidemia, unspecified: Secondary | ICD-10-CM

## 2021-03-20 DIAGNOSIS — I25118 Atherosclerotic heart disease of native coronary artery with other forms of angina pectoris: Secondary | ICD-10-CM

## 2021-04-08 ENCOUNTER — Other Ambulatory Visit: Payer: Self-pay | Admitting: Family

## 2021-04-08 DIAGNOSIS — I10 Essential (primary) hypertension: Secondary | ICD-10-CM

## 2021-04-17 ENCOUNTER — Other Ambulatory Visit: Payer: Self-pay | Admitting: Family

## 2021-04-17 DIAGNOSIS — I25118 Atherosclerotic heart disease of native coronary artery with other forms of angina pectoris: Secondary | ICD-10-CM

## 2021-04-17 DIAGNOSIS — E785 Hyperlipidemia, unspecified: Secondary | ICD-10-CM

## 2021-05-05 ENCOUNTER — Other Ambulatory Visit: Payer: Self-pay

## 2021-05-05 MED ORDER — FAMOTIDINE 20 MG PO TABS: 20.0000 mg | ORAL_TABLET | Freq: Every day | ORAL | 0 refills | Status: DC | PRN

## 2021-05-05 NOTE — Telephone Encounter (Signed)
*  STAT* If patient is at the pharmacy, call can be transferred to refill team.   1. Which medications need to be refilled? (please list name of each medication and dose if known) famotidine  2. Which pharmacy/location (including street and city if local pharmacy) is medication to be sent to? [facility] [address]  3. Do they need a 30 day or 90 day supply? [place]

## 2021-05-17 ENCOUNTER — Encounter: Payer: Self-pay | Admitting: Gastroenterology

## 2021-05-31 ENCOUNTER — Ambulatory Visit (INDEPENDENT_AMBULATORY_CARE_PROVIDER_SITE_OTHER): Payer: PPO | Admitting: Internal Medicine

## 2021-05-31 ENCOUNTER — Other Ambulatory Visit: Payer: Self-pay

## 2021-05-31 ENCOUNTER — Encounter: Payer: Self-pay | Admitting: Internal Medicine

## 2021-05-31 VITALS — BP 130/82 | HR 69 | Ht 71.0 in | Wt 215.0 lb

## 2021-05-31 DIAGNOSIS — I2510 Atherosclerotic heart disease of native coronary artery without angina pectoris: Secondary | ICD-10-CM

## 2021-05-31 DIAGNOSIS — I10 Essential (primary) hypertension: Secondary | ICD-10-CM

## 2021-05-31 DIAGNOSIS — K30 Functional dyspepsia: Secondary | ICD-10-CM

## 2021-05-31 DIAGNOSIS — E785 Hyperlipidemia, unspecified: Secondary | ICD-10-CM

## 2021-05-31 DIAGNOSIS — R1013 Epigastric pain: Secondary | ICD-10-CM | POA: Insufficient documentation

## 2021-05-31 DIAGNOSIS — K219 Gastro-esophageal reflux disease without esophagitis: Secondary | ICD-10-CM | POA: Insufficient documentation

## 2021-05-31 NOTE — Progress Notes (Signed)
Follow-up Outpatient Visit Date: 05/31/2021  Primary Care Provider: Ria Bush, MD [place] [address]  Chief Complaint: Belching and indigestion  HPI:  Martin Garza is a 71 y.o. male with history of coronary artery disease status post PCI to proximal LAD in the setting of NSTEMI (complicated by loss of flow in the distal LAD), hypertension, hyperlipidemia, and ischemic cardiomyopathy, who presents for follow-up of coronary artery disease complicated by lightheadedness and fatigue.  I last saw him in June, at which time he complained of stable exertional dyspnea with modest activity that began after he had COVID-19 in 05/2020.  He was also concerned because shortness of breath was one of the symptoms he experienced prior to his NSTEMI in 2020.  Echocardiogram and myocardial perfusion stress test prior to the visit showed preserved LVEF and no new ischemia; prior apical infarct was noted.  I encouraged Martin Garza to increase his activity in case deconditioning was contributing to his symptoms.  Today, Martin Garza reports that he has been feeling fairly well other than recent belching and indigestion.  This is most noticeable at night when he lies down.  It improved somewhat with elevation of his head.  He had previously been on famotidine for indigestion but had stopped this.  He restarted it and noticed some improvement.  He is now taking it 2 tablets in the morning with resolution of the indigestion.  He denies frank chest pain as well as palpitations, lightheadedness, and edema.  He still has mild exertional dyspnea but feels like this has improved since our last visit.  He walks some but does not exercise regularly.  --------------------------------------------------------------------------------------------------  Past Medical History:  Diagnosis Date   Allergy    CAD (coronary artery disease)    a. 10/2018 NSTEMI/PCI: LM nl, LAD 95p (3.0x12 Resolute Onyx DES),  23m, 85d (2.0x18 Resolute Onyx DES), LCX nl, RCA large, 20ost, 30d.   Cataract    had surg in both eyes   Complication of anesthesia    Blood pressure goes up after waking up past sedation/ gets dizzy too per pt!   Essential hypertension    Hyperlipidemia LDL goal <70    Ischemic cardiomyopathy    a. 10/2018 Echo: EF 55-60%, sev apical septal, apical, and ant HK. DD. Nl RV fxn.   Medical history non-contributory    Retinal detachment 9675   left   Umbilical hernia    Past Surgical History:  Procedure Laterality Date   CARDIAC CATHETERIZATION     CATARACT EXTRACTION  2018   Bil   COLONOSCOPY  04/2018   TA, SSP, diverticulosis, rpt 3 yrs ([facility]); with polypectom y   CORONARY STENT INTERVENTION N/A 11/10/2018   Procedure: CORONARY STENT INTERVENTION;  Surgeon: Nelva Bush, MD;  Location: [facility];  Service: Cardiovascular;  Laterality: N/A;   LEFT HEART CATH AND CORONARY ANGIOGRAPHY N/A 11/10/2018   Procedure: LEFT HEART CATH AND CORONARY ANGIOGRAPHY;  Surgeon: Nelva Bush, MD;  Location: [facility];  Service: Cardiovascular;  Laterality: N/A;   Left inguinal repair  in [employer] School   RETINAL DETACHMENT SURGERY Left    x 2 in 9163   UMBILICAL HERNIA REPAIR N/A 05/30/2018   Procedure: LAPAROSCOPIC ASSISTED  UMBILICAL HERNIA REPAIR WITH MESH,  ERAS PATHWAY;  Surgeon: Johnathan Hausen, MD;  Location: [facility];  Service: General;  Laterality: N/A;    Current Meds  Medication Sig   aspirin EC 81 MG EC tablet Take 1  tablet (81 mg total) by mouth daily.   Cyanocobalamin (B-12 PO) Take 500 mcg by mouth daily.    ezetimibe (ZETIA) 10 MG tablet Take 1 tablet (10 mg total) by mouth daily. PLEASE SCHEDULE AN APPOINTMENT FOR FURTHER REFILLS   famotidine (PEPCID) 20 MG tablet Take 20 mg by mouth 2 (two) times daily as needed for heartburn or indigestion.   nitroGLYCERIN (NITROSTAT) 0.4 MG SL tablet Place 1 tablet (0.4 mg total) under the tongue every 5 (five) minutes x 3  doses as needed for chest pain.   rosuvastatin (CRESTOR) 40 MG tablet TAKE 1 TABLET BY MOUTH EVERY DAY   valsartan (DIOVAN) 160 MG tablet TAKE 1 TABLET BY MOUTH EVERY DAY   vitamin C (ASCORBIC ACID) 500 MG tablet Take 1 tablet (500 mg total) by mouth every Monday, Wednesday, and Friday.    Allergies: Lisinopril  Social History   Tobacco Use   Smoking status: Never   Smokeless tobacco: Former    Types: Chew    Quit date: 05/21/1986  Vaping Use   Vaping Use: Never used  Substance Use Topics   Alcohol use: Not Currently    Alcohol/week: 1.0 standard drink    Types: 1 Glasses of wine per week    Comment: rare   Drug use: No    Family History  Problem Relation Age of Onset   Cancer Mother 10       metastatic cancer, smoker   Coronary artery disease Father 29       MI with CABG, smoker   Stroke Father        after catheterization   Alcohol abuse Father    Alcohol abuse Brother    Coronary artery disease Brother 29       CABG, smoker   Depression Brother    Drug abuse Brother    Hearing loss Daughter    Cancer Maternal Aunt        breast   Breast cancer Maternal Aunt    Diabetes Neg Hx    Colon cancer Neg Hx    Rectal cancer Neg Hx    Stomach cancer Neg Hx     Review of Systems: Martin Garza notes some sinus congestion and clear drainage with postnasal drip over the last 4 weeks.  He notes that several coworkers have had sinus infections and wonders if this could be causing his symptoms.  Otherwise, a 12-system review of systems was performed and was negative except as noted in the HPI.  --------------------------------------------------------------------------------------------------  Physical Exam: BP 130/82 (BP Location: Left Arm, Patient Position: Sitting, Cuff Size: Large)    Pulse 69    Ht 5\' 11"  (1.803 m)    Wt 215 lb (97.5 kg)    SpO2 98%    BMI 29.99 kg/m   General:  NAD. Neck: No JVD or HJR. Lungs: Clear to auscultation bilaterally without wheezes or  crackles. Heart: Regular rate and rhythm without murmurs, rubs, or gallops. Abdomen: Soft, nontender, nondistended. Extremities: No lower extremity edema.  EKG: Normal sinus rhythm with sinus arrhythmia.  No significant abnormality.  Heart rate has increased since 09/16/2020.  Otherwise, no significant interval change.  Lab Results  Component Value Date   WBC 6.0 11/16/2020   HGB 14.8 11/16/2020   HCT 42.5 11/16/2020   MCV 94 11/16/2020   PLT 200 11/16/2020    Lab Results  Component Value Date   NA 141 03/07/2021   K 4.3 03/07/2021   CL 106 03/07/2021  CO2 26 03/07/2021   BUN 17 03/07/2021   CREATININE 1.13 03/07/2021   GLUCOSE 103 (H) 03/07/2021   ALT 37 11/16/2020    Lab Results  Component Value Date   CHOL 126 11/16/2020   HDL 46 11/16/2020   LDLCALC 61 11/16/2020   LDLDIRECT 160.0 02/13/2018   TRIG 102 11/16/2020   CHOLHDL 2.7 11/16/2020    --------------------------------------------------------------------------------------------------  ASSESSMENT AND PLAN: Coronary artery disease: Mr. Peppard continues to do well without recurrent angina.  Stress test last year was low risk, showing apical infarct but no ischemia.  Continue current medications for secondary prevention.  Hyperlipidemia: Lipids well controlled on last check in June.  Continue rosuvastatin and ezetimibe for secondary prevention with target LDL less than 70.  Hypertension: Blood pressure upper normal today.  No medication changes at this time.  Indigestion: I have encouraged Mr. Gudino to continue taking famotidine 40 mg daily for at least 1 or 2 more weeks.  After that time, he can titrate down to 20 mg daily in the hope of eventually stopping.  If he continues to have symptoms, we may consider switching from aspirin to clopidogrel, in case aspirin is contributing to his symptoms.  If this does not help, trial of PPI and/or GI consultation will need to be considered.  Follow-up: Return to  clinic in 6 months.  Nelva Bush, MD 05/31/2021 10:56 AM

## 2021-05-31 NOTE — Patient Instructions (Signed)
Medication Instructions:   Your physician recommends that you continue on your current medications as directed. Please refer to the Current Medication list given to you today.  *If you need a refill on your cardiac medications before your next appointment, please call your pharmacy*   Lab Work:  None ordered  Testing/Procedures:  None ordered   Follow-Up: At [facility], you and your health needs are our priority.  As part of our continuing mission to provide you with exceptional heart care, we have created designated Provider Care Teams.  These Care Teams include your primary Cardiologist (physician) and Advanced Practice Providers (APPs -  Physician Assistants and Nurse Practitioners) who all work together to provide you with the care you need, when you need it.  We recommend signing up for the patient portal called "MyChart".  Sign up information is provided on this After Visit Summary.  MyChart is used to connect with patients for Virtual Visits (Telemedicine).  Patients are able to view lab/test results, encounter notes, upcoming appointments, etc.  Non-urgent messages can be sent to your provider as well.   To learn more about what you can do with MyChart, go to [email or web address].    Your next appointment:   6 month(s)  The format for your next appointment:   In Person  Provider:   You may see Nelva Bush, MD or one of the following Advanced Practice Providers on your designated Care Team:   Murray Hodgkins, NP Christell Faith, PA-C Cadence Kathlen Mody, [place]  Other Instructions  [facility]

## 2021-06-19 ENCOUNTER — Other Ambulatory Visit: Payer: Self-pay | Admitting: Internal Medicine

## 2021-06-19 DIAGNOSIS — I25118 Atherosclerotic heart disease of native coronary artery with other forms of angina pectoris: Secondary | ICD-10-CM

## 2021-06-19 DIAGNOSIS — E785 Hyperlipidemia, unspecified: Secondary | ICD-10-CM

## 2021-08-04 IMAGING — CT CT ABD-PELV W/ CM
2 of 5 series · 15 of 46 positions shown, 17 images · IV contrast (omnipaque)
Comparison: None.

CLINICAL DATA: Lower abdominal pain with constipation. Pain
primarily right-sided

EXAM:
CT ABDOMEN AND PELVIS WITH CONTRAST
TECHNIQUE: Multidetector CT imaging of the abdomen and pelvis was performed
using the standard protocol following bolus administration of
intravenous contrast. Oral contrast also administered.
CONTRAST:  100mL OMNIPAQUE IOHEXOL 300 MG/ML  SOLN

[Series 2: abd/pel w · axial · 0.81mm/px · z∈[-515,-85]mm · 12 of 98 slices shown, 14 images]
[im 6/98  soft-tissue]
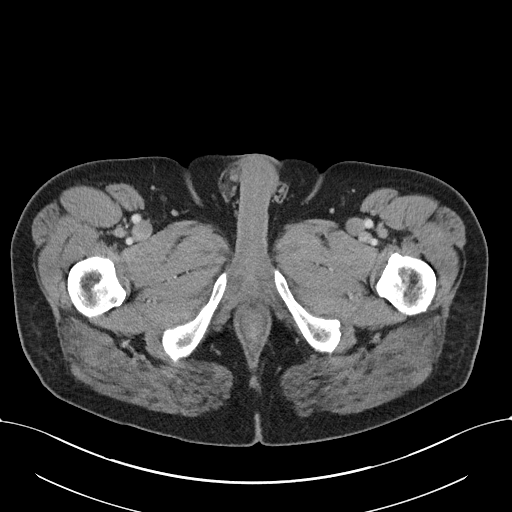
[im 6/98  bone]
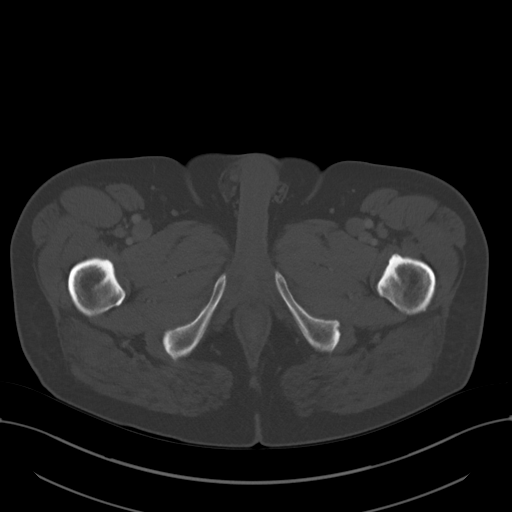
[im 16/98  soft-tissue]
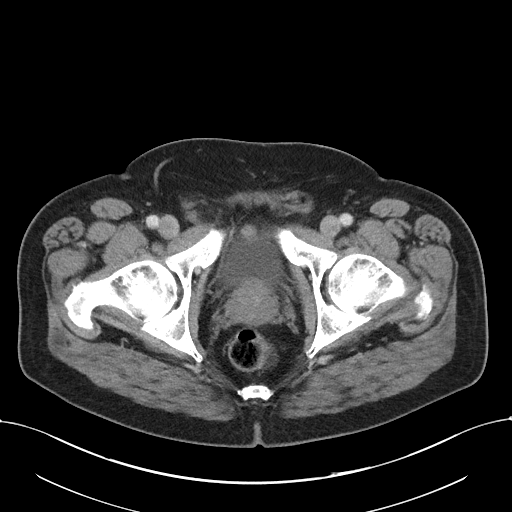
[im 21/98  soft-tissue]
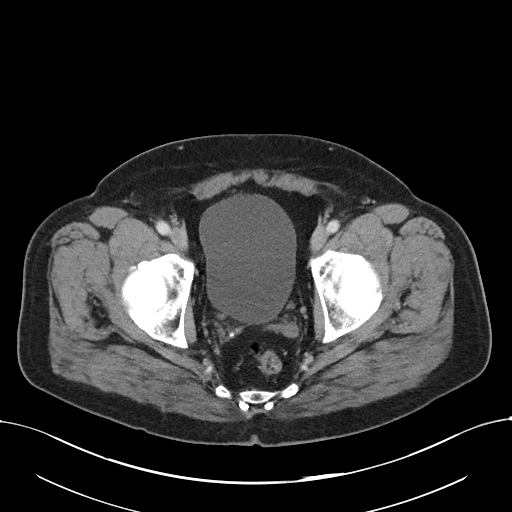
[im 31/98  soft-tissue]
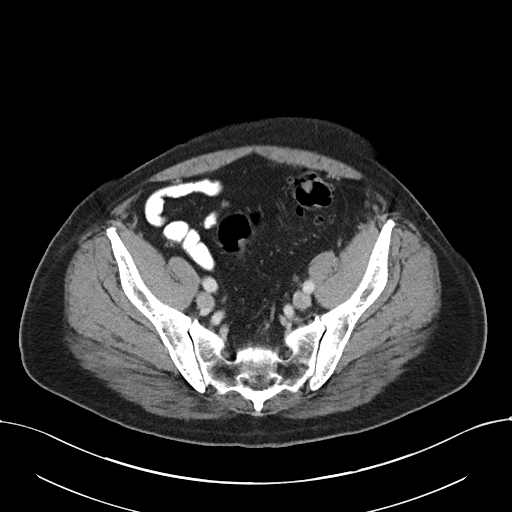
[im 36/98  soft-tissue]
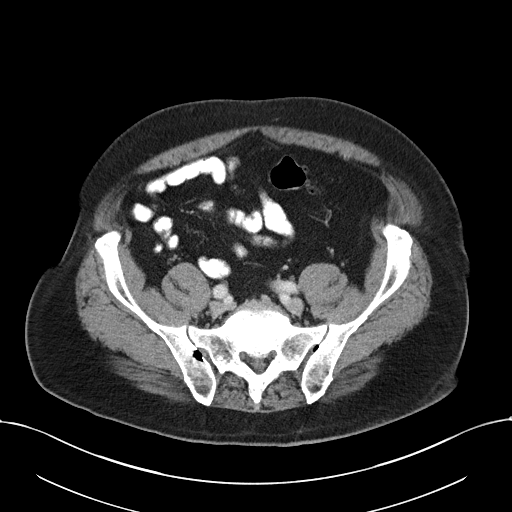
[im 46/98  soft-tissue]
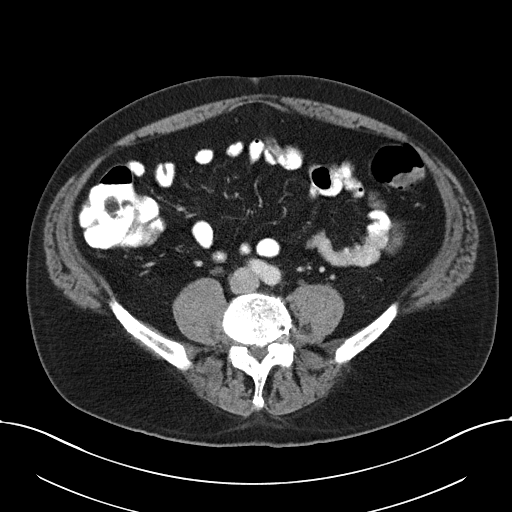
[im 52/98  soft-tissue]
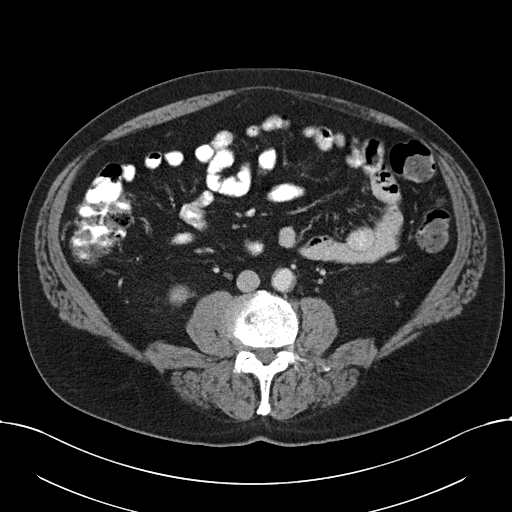
[im 62/98  soft-tissue]
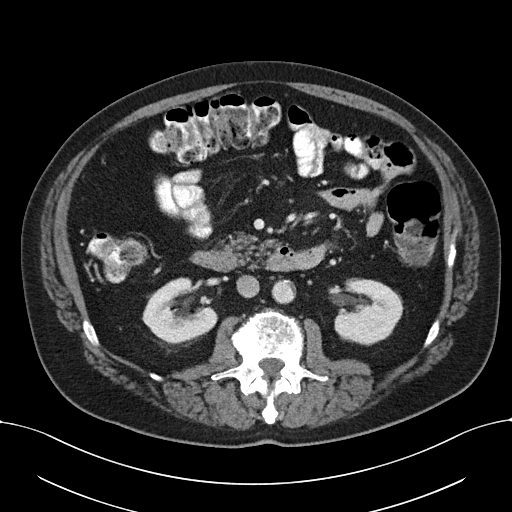
[im 67/98  soft-tissue]
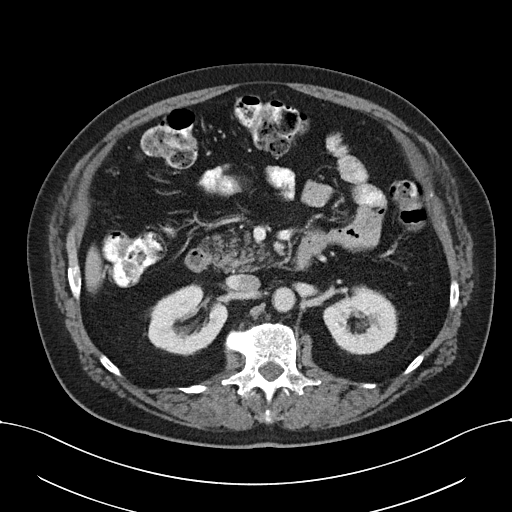
[im 67/98  bone]
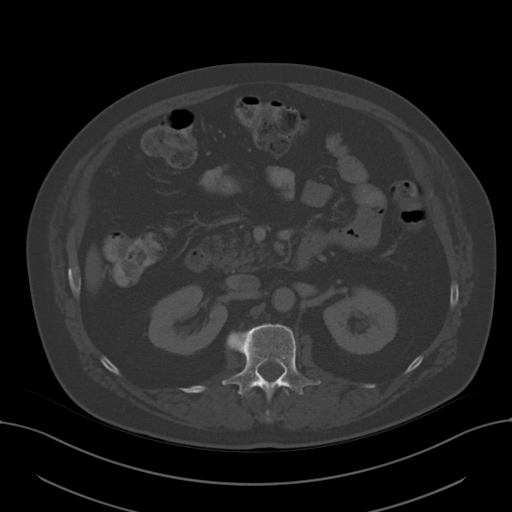
[im 77/98  soft-tissue]
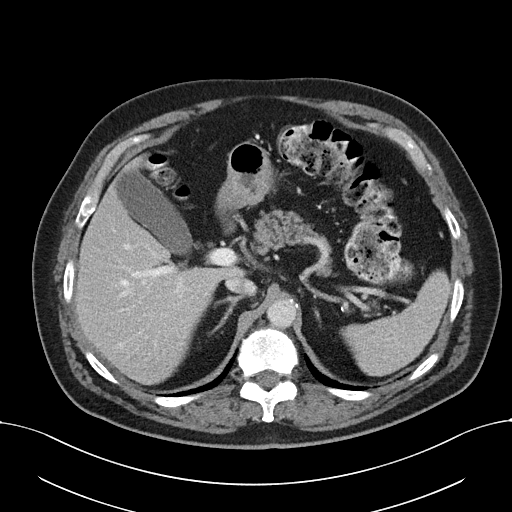
[im 82/98  soft-tissue]
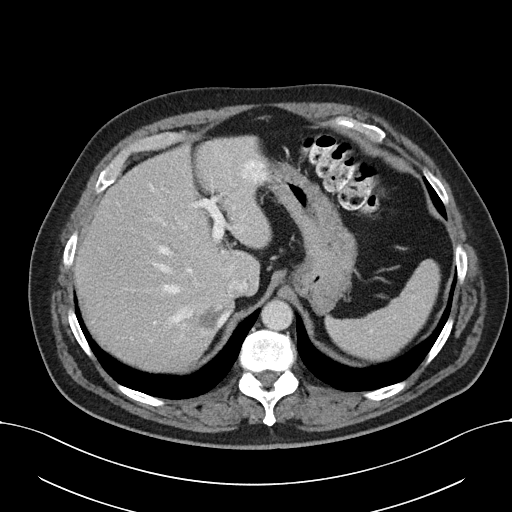
[im 92/98  soft-tissue]
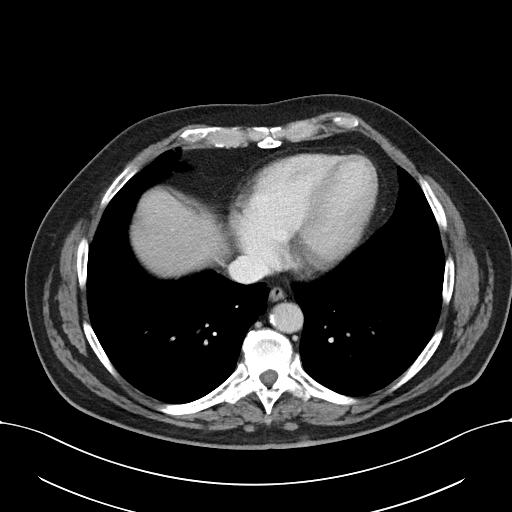

[Series 5: coronal st · coronal · 0.77mm/px · 3 of 98 slices shown]
[im 33/98  soft-tissue]
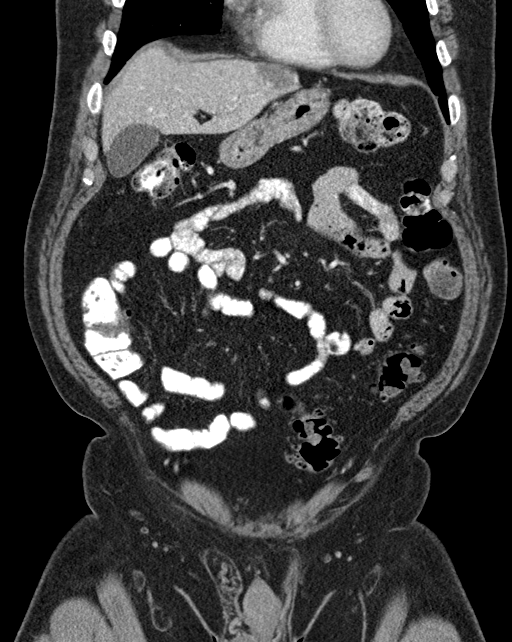
[im 44/98  soft-tissue]
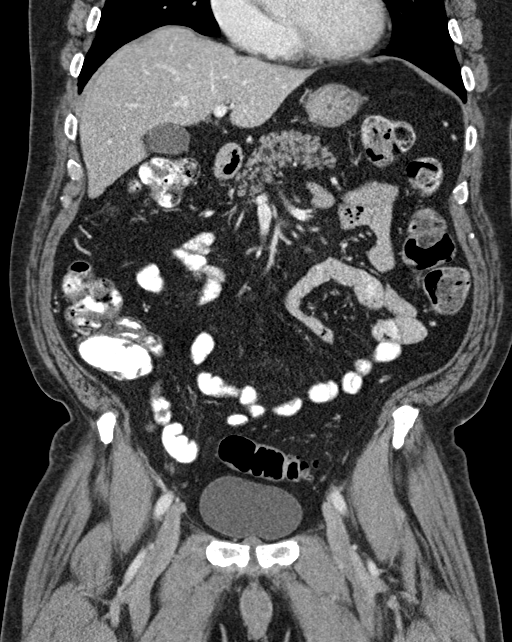
[im 54/98  soft-tissue]
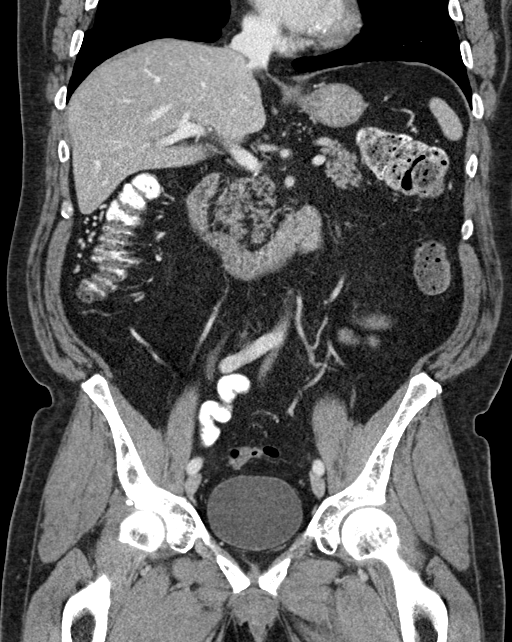

[15 of 46 positions shown; findings below may reference images not displayed]

FINDINGS: Lower chest: Lung bases are clear.

Hepatobiliary: Underlying hepatic steatosis. There is an apparent
hemangioma in the left lobe of the liver measuring 5.4 x 4.2 cm. A
smaller apparent hemangioma is noted in the posteromedial right lobe
of the liver measuring 2.4 x 1.9 cm. No other liver lesions are
appreciable. Gallbladder wall is not appreciably thickened. There is
no evident biliary duct dilatation.

Pancreas: There is no pancreatic mass or inflammatory focus.

Spleen: No splenic lesions are evident.

Adrenals/Urinary Tract: Adrenals bilaterally appear normal. There is
a cyst arising from the upper pole of the right kidney measuring
x 0.7 cm. There is no hydronephrosis on either side. There is no
evident renal or ureteral calculus on either side. Urinary bladder
is midline with wall thickness within normal limits.

Stomach/Bowel: There are multiple sigmoid diverticula without
appreciable diverticulitis. No evident bowel wall thickening. There
is moderate stool in the colon. The terminal ileum appears normal.
There is mild fatty infiltration in the ileocecal valve. There is no
evident bowel obstruction. There is no free air or portal venous
air.

Vascular/Lymphatic: No abdominal aortic aneurysm. There is
atherosclerosis in the aorta. Major venous structures appear patent.
No evident adenopathy in the abdomen or pelvis.

Reproductive: There are occasional prostatic calculi. Prostate and
seminal vesicles overall are normal in size. Note that the along the
superior aspect of the prostate, there is mild lobulation and
contour irregularity.

Other: Appendix appears normal. No abscess or ascites is evident in
the abdomen or pelvis.

Musculoskeletal: There is degenerative change in the lower thoracic
and lumbar spine regions. There is a degree of spinal stenosis at
L4-5 due to bony hypertrophy and disc protrusion. There are no
blastic or lytic bone lesions. No intramuscular lesions are evident.
IMPRESSION: 1. There are sigmoid diverticula without diverticulitis. No evident
bowel obstruction. No abscess in the abdomen or pelvis. Appendix
appears normal.

2. No evident renal or ureteral calculus on either side. No evident
hydronephrosis. Urinary bladder wall thickness normal.

3. Mildly irregular contour along the superior aspect of the
prostate. This finding warrants clinical assessment and PSA
evaluation. Prostate not grossly enlarged.

4.  Hepatic steatosis.  Apparent liver hemangiomas.

5. There is a degree of spinal stenosis at L4-5 due to disc
protrusion and bony hypertrophy.

6.  Aortic Atherosclerosis ([id number].G).

## 2021-08-07 ENCOUNTER — Other Ambulatory Visit: Payer: Self-pay | Admitting: Internal Medicine

## 2021-08-14 ENCOUNTER — Telehealth: Payer: Self-pay | Admitting: Internal Medicine

## 2021-08-14 NOTE — Telephone Encounter (Signed)
Please advise if ok to refill Famotidine 20 mg bid. ?Last filled by historical provider. ?

## 2021-08-14 NOTE — Telephone Encounter (Signed)
?*  STAT* If patient is at the pharmacy, call can be transferred to refill team. ? ? ?1. Which medications need to be refilled? (please list name of each medication and dose if known) Famotidine '20mg'$  twice a day as needed ? ?2. Which pharmacy/location (including street and city if local pharmacy) is medication to be sent to? [facility] ? ?3. Do they need a 30 day or 90 day supply? 90 day ?

## 2021-08-15 NOTE — Telephone Encounter (Signed)
Attempted to call pt. No answer. Voicemail box is full and unable to leave message.  ?Will try to reach pt at later time.  ?

## 2021-08-15 NOTE — Telephone Encounter (Signed)
Per last ov note with Dr. Saunders Revel 05/31/21: ? ?Indigestion: ?I have encouraged Martin Garza to continue taking famotidine 40 mg daily for at least 1 or 2 more weeks.  After that time, he can titrate down to 20 mg daily in the hope of eventually stopping.  If he continues to have symptoms, we may consider switching from aspirin to clopidogrel, in case aspirin is contributing to his symptoms.  If this does not help, trial of PPI and/or GI consultation will need to be considered. ? ?Called and spoke with pt to follow up with symptoms.  ?Pt states that he did titrate down and is now taking famotidine 20 mg only once daily as needed for indigestion.  ?Pt reports that he is taking for symptoms approximately 1-2 times per week. Pt trying to watch diet, but usually needs after certain foods.  ? ?Pt states that he has about a month supply left on hand.  ?Notified pt I will need to discuss with Dr. Saunders Revel regarding sending refill to take famotidine as needed versus moving forward with plan noted above.  ? ?Pt is aware Dr. Saunders Revel is not in the office this week. He does have enough famotidine on hand at this time. Pt voiced understanding we will call back with further recc and pt has no further questions at this time.  ?

## 2021-08-15 NOTE — Telephone Encounter (Signed)
It is reasonable to refill prescription for famotidine 20 mg BID prn indigestion.  Of note, he can also purchase this over-the-counter if he prefers. ? ?Nelva Bush, MD ?[facility] ? ?

## 2021-08-16 MED ORDER — FAMOTIDINE 20 MG PO TABS: 20.0000 mg | ORAL_TABLET | Freq: Two times a day (BID) | ORAL | 0 refills | Status: DC | PRN

## 2021-08-16 NOTE — Telephone Encounter (Signed)
Called and spoke to pt. Notified Dr. Darnelle Bos message below.  ?Pt states that the Rx of famotidine is cheaper through his insurance so does prefer Rx to be sent.  ?Notified pt I have sent in refills for famotidine 20 mg BID prn for indigestion.  ? ?Pt appreciative and has no further questions at this time.  ?

## 2021-09-18 DIAGNOSIS — H524 Presbyopia: Secondary | ICD-10-CM | POA: Diagnosis not present

## 2021-09-18 DIAGNOSIS — Z961 Presence of intraocular lens: Secondary | ICD-10-CM | POA: Diagnosis not present

## 2021-11-13 ENCOUNTER — Other Ambulatory Visit: Payer: Self-pay | Admitting: Internal Medicine

## 2021-11-13 NOTE — Telephone Encounter (Signed)
Please advise if OK to refill or defer to PCP. Thank you!

## 2021-12-06 ENCOUNTER — Other Ambulatory Visit
Admission: RE | Admit: 2021-12-06 | Discharge: 2021-12-06 | Disposition: A | Discharge status: Home / Self Care | Payer: PPO | Source: Ambulatory Visit | Attending: Internal Medicine | Admitting: Internal Medicine

## 2021-12-06 ENCOUNTER — Ambulatory Visit (INDEPENDENT_AMBULATORY_CARE_PROVIDER_SITE_OTHER): Payer: PPO | Admitting: Internal Medicine

## 2021-12-06 ENCOUNTER — Encounter: Payer: Self-pay | Admitting: Internal Medicine

## 2021-12-06 VITALS — BP 130/70 | HR 87 | Ht 71.0 in | Wt 210.0 lb

## 2021-12-06 DIAGNOSIS — R1013 Epigastric pain: Secondary | ICD-10-CM | POA: Diagnosis not present

## 2021-12-06 DIAGNOSIS — K219 Gastro-esophageal reflux disease without esophagitis: Secondary | ICD-10-CM | POA: Diagnosis not present

## 2021-12-06 DIAGNOSIS — E785 Hyperlipidemia, unspecified: Secondary | ICD-10-CM | POA: Insufficient documentation

## 2021-12-06 DIAGNOSIS — K30 Functional dyspepsia: Secondary | ICD-10-CM

## 2021-12-06 DIAGNOSIS — I2510 Atherosclerotic heart disease of native coronary artery without angina pectoris: Secondary | ICD-10-CM | POA: Diagnosis not present

## 2021-12-06 DIAGNOSIS — I10 Essential (primary) hypertension: Secondary | ICD-10-CM | POA: Insufficient documentation

## 2021-12-06 LAB — CBC
HCT: 40.7 % (ref 39.0–52.0)
Hemoglobin: 13.9 g/dL (ref 13.0–17.0)
MCH: 31.8 pg (ref 26.0–34.0)
MCHC: 34.2 g/dL (ref 30.0–36.0)
MCV: 93.1 fL (ref 80.0–100.0)
Platelets: 191 10*3/uL (ref 150–400)
RBC: 4.37 MIL/uL (ref 4.22–5.81)
RDW: 12.3 % (ref 11.5–15.5)
WBC: 6.8 10*3/uL (ref 4.0–10.5)
nRBC: 0 % (ref 0.0–0.2)

## 2021-12-06 LAB — COMPREHENSIVE METABOLIC PANEL
ALT: 28 U/L (ref 0–44)
AST: 26 U/L (ref 15–41)
Albumin: 4.3 g/dL (ref 3.5–5.0)
Alkaline Phosphatase: 40 U/L (ref 38–126)
Anion gap: 6 (ref 5–15)
BUN: 19 mg/dL (ref 8–23)
CO2: 25 mmol/L (ref 22–32)
Calcium: 9.2 mg/dL (ref 8.9–10.3)
Chloride: 110 mmol/L (ref 98–111)
Creatinine, Ser: 1.3 mg/dL — ABNORMAL HIGH (ref 0.61–1.24)
GFR, Estimated: 58 mL/min — ABNORMAL LOW (ref >60)
Glucose, Bld: 119 mg/dL — ABNORMAL HIGH (ref 70–99)
Potassium: 3.7 mmol/L (ref 3.5–5.1)
Sodium: 141 mmol/L (ref 135–145)
Total Bilirubin: 0.8 mg/dL (ref 0.3–1.2)
Total Protein: 7.3 g/dL (ref 6.5–8.1)

## 2021-12-06 LAB — LIPID PANEL
Cholesterol: 126 mg/dL (ref 0–200)
HDL: 47 mg/dL (ref >40)
LDL Cholesterol: 50 mg/dL (ref 0–99)
Total CHOL/HDL Ratio: 2.7 RATIO
Triglycerides: 144 mg/dL (ref <150)
VLDL: 29 mg/dL (ref 0–40)

## 2021-12-06 NOTE — Patient Instructions (Signed)
Medication Instructions:   Your physician recommends that you continue on your current medications as directed. Please refer to the Current Medication list given to you today.  *If you need a refill on your cardiac medications before your next appointment, please call your pharmacy*   Lab Work:  Today at the medical mall: CBC, CMET, Lipid panel  If you have labs (blood work) drawn today and your tests are completely normal, you will receive your results only by: MyChart Message (if you have MyChart) OR A paper copy in the mail If you have any lab test that is abnormal or we need to change your treatment, we will call you to review the results.   Testing/Procedures:  None ordered   Follow-Up: At [facility], you and your health needs are our priority.  As part of our continuing mission to provide you with exceptional heart care, we have created designated Provider Care Teams.  These Care Teams include your primary Cardiologist (physician) and Advanced Practice Providers (APPs -  Physician Assistants and Nurse Practitioners) who all work together to provide you with the care you need, when you need it.  We recommend signing up for the patient portal called "MyChart".  Sign up information is provided on this After Visit Summary.  MyChart is used to connect with patients for Virtual Visits (Telemedicine).  Patients are able to view lab/test results, encounter notes, upcoming appointments, etc.  Non-urgent messages can be sent to your provider as well.   To learn more about what you can do with MyChart, go to [email or web address].    Your next appointment:   1 year(s)  The format for your next appointment:   In Person  Provider:   You may see Nelva Bush, MD or one of the following Advanced Practice Providers on your designated Care Team:   Murray Hodgkins, NP Christell Faith, PA-C Cadence Kathlen Mody, PA-C :1}    Other Instructions  You have been referred to [facility]  [facility] (GI). You will be contacted by their office to schedule an appointment.   Important Information About Sugar

## 2021-12-06 NOTE — Progress Notes (Signed)
Follow-up Outpatient Visit Date: 12/06/2021  Primary Care Provider: Ria Bush, MD [place] [address]  Chief Complaint: Follow-up coronary artery disease  HPI:  Martin Garza is a 71 y.o. male with history of coronary artery disease status post PCI to proximal LAD in the setting of NSTEMI (complicated by loss of flow in the distal LAD), hypertension, hyperlipidemia, and ischemic cardiomyopathy, who presents for follow-up of coronary artery disease.  I last saw him in January, at which time he was feeling fairly well other than indigestion and belching, particularly when lying down at night.  This was improving with regular use of famotidine.  I encouraged him to continue using famotidine regularly for a few more weeks and then to try tapering off.  No other medication changes or additional testing were pursued.  Today, [name] reports that he has been feeling fairly well other than some continued indigestion.  It seems to respond to famotidine use, though he is nervous about taking this daily on a long-term basis.  He also endorses some right-sided flank pain that improves with acetaminophen.  He denies chest pain, shortness of breath, palpitations, lightheadedness, and edema.  --------------------------------------------------------------------------------------------------  Past Medical History:  Diagnosis Date   Allergy    CAD (coronary artery disease)    a. 10/2018 NSTEMI/PCI: LM nl, LAD 95p (3.0x12 Resolute Onyx DES), 20m 85d (2.0x18 Resolute Onyx DES), LCX nl, RCA large, 20ost, 30d.   Cataract    had surg in both eyes   Complication of anesthesia    Blood pressure goes up after waking up past sedation/ gets dizzy too per pt!   Essential hypertension    Hyperlipidemia LDL goal <70    Ischemic cardiomyopathy    a. 10/2018 Echo: EF 55-60%, sev apical septal, apical, and ant HK. DD. Nl RV fxn.   Medical history non-contributory    Retinal detachment  [id number]  left   Umbilical hernia    Past Surgical History:  Procedure Laterality Date   CARDIAC CATHETERIZATION     CATARACT EXTRACTION  2018   Bil   COLONOSCOPY  04/2018   TA, SSP, diverticulosis, rpt 3 yrs (Danis); with polypectom y   CORONARY STENT INTERVENTION N/A 11/10/2018   Procedure: CORONARY STENT INTERVENTION;  Surgeon: ENelva Bush MD;  [facility];  Service: Cardiovascular;  Laterality: N/A;   LEFT HEART CATH AND CORONARY ANGIOGRAPHY N/A 11/10/2018   Procedure: LEFT HEART CATH AND CORONARY ANGIOGRAPHY;  Surgeon: ENelva Bush MD;  [facility];  Service: Cardiovascular;  Laterality: N/A;   Left inguinal repair  in [employer] School   RETINAL DETACHMENT SURGERY Left    x 2 in [id number]  UMBILICAL HERNIA REPAIR N/A 05/30/2018   Procedure: LAPAROSCOPIC ASSISTED  UMBILICAL HERNIA REPAIR WITH MESH,  ERAS PATHWAY;  Surgeon: MJohnathan Hausen MD;  Location: [facility];  Service: General;  Laterality: N/A;    Current Meds  Medication Sig   aspirin EC 81 MG EC tablet Take 1 tablet (81 mg total) by mouth daily.   Cyanocobalamin (B-12 PO) Take 500 mcg by mouth daily.    ezetimibe (ZETIA) 10 MG tablet TAKE 1 TABLET (10 MG TOTAL) BY MOUTH DAILY. PLEASE SCHEDULE AN APPOINTMENT FOR FURTHER REFILLS   famotidine (PEPCID) 20 MG tablet Take 1 tablet (20 mg total) by mouth 2 (two) times daily as needed for heartburn or indigestion.   nitroGLYCERIN (NITROSTAT) 0.4 MG SL tablet Place 1 tablet (0.4 mg total)  under the tongue every 5 (five) minutes x 3 doses as needed for chest pain.   rosuvastatin (CRESTOR) 40 MG tablet TAKE 1 TABLET BY MOUTH EVERY DAY   valsartan (DIOVAN) 160 MG tablet TAKE 1 TABLET BY MOUTH EVERY DAY   vitamin C (ASCORBIC ACID) 500 MG tablet Take 1 tablet (500 mg total) by mouth every Monday, Wednesday, and Friday.    Allergies: Lisinopril  Social History   Tobacco Use   Smoking status: Never   Smokeless tobacco: Former    Types: Chew     Quit date: 05/21/1986  Vaping Use   Vaping Use: Never used  Substance Use Topics   Alcohol use: Not Currently    Alcohol/week: 1.0 standard drink of alcohol    Types: 1 Glasses of wine per week    Comment: rare   Drug use: No    Family History  Problem Relation Age of Onset   Cancer Mother 61       metastatic cancer, smoker   Coronary artery disease Father 50       MI with CABG, smoker   Stroke Father        after catheterization   Alcohol abuse Father    Alcohol abuse Brother    Coronary artery disease Brother 58       CABG, smoker   Depression Brother    Drug abuse Brother    Hearing loss Daughter    Cancer Maternal Aunt        breast   Breast cancer Maternal Aunt    Diabetes Neg Hx    Colon cancer Neg Hx    Rectal cancer Neg Hx    Stomach cancer Neg Hx     Review of Systems: A 12-system review of systems was performed and was negative except as noted in the HPI.  --------------------------------------------------------------------------------------------------  Physical Exam: BP 130/70 (BP Location: Left Arm, Patient Position: Sitting, Cuff Size: Normal)   Pulse 87   Ht '5\' 11"'$  (1.803 m)   Wt 210 lb (95.3 kg)   SpO2 94%   BMI 29.29 kg/m   General:  NAD. Neck: No JVD or HJR. Lungs: Clear to auscultation bilaterally without wheezes or crackles. Heart: Regular rate and rhythm without murmurs, rubs, or gallops. Abdomen: Soft, nontender, nondistended. Extremities: No lower extremity edema.  EKG: Normal sinus rhythm with left axis deviation and septal infarct.  Compared with prior tracing from 05/31/2021, criteria for septal infarct are now present.  Lab Results  Component Value Date   WBC 6.0 11/16/2020   HGB 14.8 11/16/2020   HCT 42.5 11/16/2020   MCV 94 11/16/2020   PLT 200 11/16/2020    Lab Results  Component Value Date   NA 141 03/07/2021   K 4.3 03/07/2021   CL 106 03/07/2021   CO2 26 03/07/2021   BUN 17 03/07/2021   CREATININE 1.13 03/07/2021    GLUCOSE 103 (H) 03/07/2021   ALT 37 11/16/2020    Lab Results  Component Value Date   CHOL 126 11/16/2020   HDL 46 11/16/2020   LDLCALC 61 11/16/2020   LDLDIRECT 160.0 02/13/2018   TRIG 102 11/16/2020   CHOLHDL 2.7 11/16/2020    --------------------------------------------------------------------------------------------------  ASSESSMENT AND PLAN: Coronary artery disease: No angina reported.  Continue current regimen of aspirin, ezetimibe, and rosuvastatin for secondary prevention.  We will check a CBC, CMP, and lipid panel today.  Hypertension: Blood pressure upper normal today.  No medication changes at this time.  Hyperlipidemia: Lipids at  goal on last check over a year ago.  Continue ezetimibe and rosuvastatin with plans for CMP and lipid panel today.  GERD and dyspepsia: This has been an ongoing issue for Mr. Rabel.  He notes some improvement with famotidine but does not wish to take this medicine long-term.  We discussed switching from aspirin to clopidogrel in case aspirin is contributing, but he wishes to defer this in favor of GI evaluation.  We will refer him to [facility] gastroenterology for further evaluation.  Follow-up: Return to clinic in 1 year.  Nelva Bush, MD 12/06/2021 3:49 PM

## 2021-12-07 ENCOUNTER — Encounter: Payer: Self-pay | Admitting: Internal Medicine

## 2021-12-07 DIAGNOSIS — K219 Gastro-esophageal reflux disease without esophagitis: Secondary | ICD-10-CM | POA: Insufficient documentation

## 2021-12-11 ENCOUNTER — Telehealth: Payer: Self-pay

## 2021-12-11 DIAGNOSIS — I255 Ischemic cardiomyopathy: Secondary | ICD-10-CM

## 2021-12-11 DIAGNOSIS — I10 Essential (primary) hypertension: Secondary | ICD-10-CM

## 2021-12-11 NOTE — Telephone Encounter (Signed)
-----   Message from Nelva Bush, MD sent at 12/07/2021  6:34 AM EDT ----- Please let Martin Garza know that his cholesterol is well controlled.  His blood counts are also normal.  His kidney function is slightly worse than on last check 9 months ago.  This could be due to to an element of dehydration.  I recommend that he try to increase his water intake and avoid nonsteroidal anti-inflammatory drugs like ibuprofen and naproxen.  I suggest that we repeat a BMP in about a month to ensure that his creatinine is not rising.

## 2021-12-11 NOTE — Telephone Encounter (Signed)
The patient has been notified of the result and verbalized understanding.  All questions (if any) were answered. Kavin Leech, RN 12/11/2021 4:10 PM

## 2022-01-17 ENCOUNTER — Other Ambulatory Visit: Payer: Self-pay | Admitting: Internal Medicine

## 2022-02-01 ENCOUNTER — Other Ambulatory Visit: Payer: Self-pay | Admitting: Family

## 2022-02-01 ENCOUNTER — Telehealth (INDEPENDENT_AMBULATORY_CARE_PROVIDER_SITE_OTHER): Payer: PPO | Admitting: Family Medicine

## 2022-02-01 VITALS — Temp 98.3°F | Ht 71.0 in | Wt 204.0 lb

## 2022-02-01 DIAGNOSIS — I25118 Atherosclerotic heart disease of native coronary artery with other forms of angina pectoris: Secondary | ICD-10-CM

## 2022-02-01 DIAGNOSIS — E785 Hyperlipidemia, unspecified: Secondary | ICD-10-CM

## 2022-02-01 DIAGNOSIS — Z20822 Contact with and (suspected) exposure to covid-19: Secondary | ICD-10-CM | POA: Diagnosis not present

## 2022-02-01 DIAGNOSIS — U071 COVID-19: Secondary | ICD-10-CM | POA: Diagnosis not present

## 2022-02-01 MED ORDER — BENZONATATE 100 MG PO CAPS: ORAL_CAPSULE | ORAL | 0 refills | Status: DC

## 2022-02-01 MED ORDER — NIRMATRELVIR/RITONAVIR (PAXLOVID) TABLET (RENAL DOSING): 2.0000 | ORAL_TABLET | Freq: Two times a day (BID) | ORAL | 0 refills | Status: AC

## 2022-02-01 NOTE — Progress Notes (Signed)
Virtual Visit via Video Note  I connected with Tylon  on 02/01/22 at  3:20 PM EDT by a video enabled telemedicine application and verified that I am speaking with the correct person using two identifiers.  Location patient: [place] Location provider:work or home office Persons participating in the virtual visit: patient, provider  I discussed the limitations and requested verbal permission for telemedicine visit. The patient expressed understanding and agreed to proceed.   HPI:  Acute telemedicine visit for Covid19: -Onset: 3 days -Symptoms include: nasal congestion, cough, low grade fever -Denies: CP, SOB, NVD -able to drink fluids -Pertinent past medical history: see below, has had covid once before, GFR 58 1 month ago -Pertinent medication allergies: Allergies  Allergen Reactions   Lisinopril Cough  -COVID-19 vaccine status: 2 doses and 2 boosters Immunization History  Administered Date(s) Administered   PFIZER(Purple Top)SARS-COV-2 Vaccination 06/30/2019, 07/21/2019, 06/03/2020   Pneumococcal Conjugate-13 01/31/2015   Pneumococcal Polysaccharide-23 02/01/2016   Td 02/24/2004   Tdap 08/29/2011     ROS: See pertinent positives and negatives per HPI.  Past Medical History:  Diagnosis Date   Allergy    CAD (coronary artery disease)    a. 10/2018 NSTEMI/PCI: LM nl, LAD 95p (3.0x12 Resolute Onyx DES), 39m 85d (2.0x18 Resolute Onyx DES), LCX nl, RCA large, 20ost, 30d.   Cataract    had surg in both eyes   Complication of anesthesia    Blood pressure goes up after waking up past sedation/ gets dizzy too per pt!   Essential hypertension    Hyperlipidemia LDL goal <70    Ischemic cardiomyopathy    a. 10/2018 Echo: EF 55-60%, sev apical septal, apical, and ant HK. DD. Nl RV fxn.   Medical history non-contributory    Retinal detachment [address]  left   Umbilical hernia     Past Surgical History:  Procedure Laterality Date   CARDIAC CATHETERIZATION     CATARACT EXTRACTION   2018   Bil   COLONOSCOPY  04/2018   TA, SSP, diverticulosis, rpt 3 yrs (Danis); with polypectom y   CORONARY STENT INTERVENTION N/A 11/10/2018   Procedure: CORONARY STENT INTERVENTION;  Surgeon: ENelva Bush MD;  [facility];  Service: Cardiovascular;  Laterality: N/A;   LEFT HEART CATH AND CORONARY ANGIOGRAPHY N/A 11/10/2018   Procedure: LEFT HEART CATH AND CORONARY ANGIOGRAPHY;  Surgeon: ENelva Bush MD;  [facility];  Service: Cardiovascular;  Laterality: N/A;   Left inguinal repair  in High School   RETINAL DETACHMENT SURGERY Left    x 2 in [id number]  UMBILICAL HERNIA REPAIR N/A 05/30/2018   Procedure: LAPAROSCOPIC ASSISTED  UMBILICAL HERNIA REPAIR WITH MESH,  ERAS PATHWAY;  Surgeon: MJohnathan Hausen MD;  Location: [facility];  Service: General;  Laterality: N/A;     Current Outpatient Medications:    aspirin EC 81 MG EC tablet, Take 1 tablet (81 mg total) by mouth daily., Disp: 30 tablet, Rfl: 1   benzonatate (TESSALON PERLES) 100 MG capsule, 1-2 capsules up to twice daily as needed for cough, Disp: 30 capsule, Rfl: 0   Cyanocobalamin (B-12 PO), Take 500 mcg by mouth daily. , Disp: , Rfl:    ezetimibe (ZETIA) 10 MG tablet, TAKE 1 TABLET (10 MG TOTAL) BY MOUTH DAILY. PLEASE SCHEDULE AN APPOINTMENT FOR FURTHER REFILLS, Disp: 90 tablet, Rfl: 3   nirmatrelvir/ritonavir EUA, renal dosing, (PAXLOVID) 10 x 150 MG & 10 x '100MG'$  TABS, Take 2 tablets by mouth 2 (two) times  daily for 5 days. (Take nirmatrelvir 150 mg one tablet twice daily for 5 days and ritonavir 100 mg one tablet twice daily for 5 days) Patient GFR is 58 on recent check., Disp: 20 tablet, Rfl: 0   nitroGLYCERIN (NITROSTAT) 0.4 MG SL tablet, Place 1 tablet (0.4 mg total) under the tongue every 5 (five) minutes x 3 doses as needed for chest pain., Disp: 20 tablet, Rfl: 2   rosuvastatin (CRESTOR) 40 MG tablet, TAKE 1 TABLET BY MOUTH EVERY DAY, Disp: 90 tablet, Rfl: 3   valsartan (DIOVAN) 160 MG  tablet, TAKE 1 TABLET BY MOUTH EVERY DAY, Disp: 90 tablet, Rfl: 3   vitamin C (ASCORBIC ACID) 500 MG tablet, Take 1 tablet (500 mg total) by mouth every Monday, Wednesday, and Friday., Disp: , Rfl:    famotidine (PEPCID) 20 MG tablet, Take 1 tablet (20 mg total) by mouth 2 (two) times daily as needed for heartburn or indigestion., Disp: 180 tablet, Rfl: 0  EXAM:  VITALS per patient if applicable:  GENERAL: alert, oriented, appears well and in no acute distress  HEENT: atraumatic, conjunttiva clear, no obvious abnormalities on inspection of external nose and ears  NECK: normal movements of the head and neck  LUNGS: on inspection no signs of respiratory distress, breathing rate appears normal, no obvious gross SOB, gasping or wheezing  CV: no obvious cyanosis  MS: moves all visible extremities without noticeable abnormality  PSYCH/NEURO: pleasant and cooperative, no obvious depression or anxiety, speech and thought processing grossly intact  ASSESSMENT AND PLAN:  Discussed the following assessment and plan:  COVID-19   Discussed treatment options, side effect and risk of drug interactions, ideal treatment window, potential complications, isolation and precautions for COVID-19.  Discussed possibility of rebound with or without antivirals. Checked for/reviewed last GFR - listed in HPI if available.  After lengthy discussion, the patient opted for treatment with Renally dosed Paxlovid due to being higher risk for complications of covid or severe disease and other factors. Discussed EUA status of this drug and the fact that there is preliminary limited knowledge of risks/interactions/side effects per EUA document vs possible benefits and precautions. This information was shared with patient during the visit and also was provided in patient instructions. He agrees to hold statin and monitor BP per recommendations given possible interactions with statin and arb. Also, advised that patient  discuss risks/interactions and use with pharmacist/treatment team as well. The patient did want a prescription for cough, Tessalon Rx sent.  Other symptomatic care measures summarized in patient instructions.   Work/School slipped offered: declined Advised to seek prompt virtual visit or in person care if worsening, new symptoms arise, or if is not improving with treatment as expected per our conversation of expected course. Discussed options for follow up care. Did let this patient know that I do telemedicine on Tuesdays and Thursdays for [place] and those are the days I am logged into the system. Advised to schedule follow up visit with PCP, [place] virtual visits or [facility] if any further questions or concerns to avoid delays in care.   I discussed the assessment and treatment plan with the patient. The patient was provided an opportunity to ask questions and all were answered. The patient agreed with the plan and demonstrated an understanding of the instructions.     Lucretia Kern, DO

## 2022-02-01 NOTE — Patient Instructions (Signed)
HOME CARE TIPS:  -COVID19 testing information: [email or web address]  Most pharmacies also offer testing and home test kits. If the Covid19 test is positive and you desire antiviral treatment, please contact a [facility] or schedule a follow up virtual visit through your primary care office or through the [employer].  Other test to treat options: [email or web address]  -I sent the medication(s) we discussed to your pharmacy: Meds ordered this encounter  Medications   nirmatrelvir/ritonavir EUA, renal dosing, (PAXLOVID) 10 x 150 MG & 10 x '100MG'$  TABS    Sig: Take 2 tablets by mouth 2 (two) times daily for 5 days. (Take nirmatrelvir 150 mg one tablet twice daily for 5 days and ritonavir 100 mg one tablet twice daily for 5 days) Patient GFR is 58 on recent check.    Dispense:  20 tablet    Refill:  0   benzonatate (TESSALON PERLES) 100 MG capsule    Sig: 1-2 capsules up to twice daily as needed for cough    Dispense:  30 capsule    Refill:  0     -I sent in the [facility] treatment or referral you requested per our discussion. Please see the information provided below and discuss further with the pharmacist/treatment team.  -If taking Paxlovid, please review all medications, supplement and over the counter drugs with your pharmacist and ask them to check for any interactions. Please make the following changes to your regular medications while taking Paxlovid: *STOP your Crestor (rosuvastatin) and restart 3 days after finishing the Paxlovid *Monitor your blood pressre or take 1/2 dose of valsartan while on the Paxlovid and monitor your blood pressure. If you are having issues with low blood pressure stop the valsartan until done with the Paxlovid.   -there is a chance of rebound illness with covid after improving. This can happen whether or not you take an antiviral treatment. If you become sick again with covid after getting  better, please schedule a follow up virtual visit and isolate again.  -Avoid ibuprfen, aleve, naproxen (NSAIDs) given recent kidney labs  -nasal saline sinus rinses twice daily  -stay hydrated, drink plenty of fluids and eat small healthy meals - avoid dairy  -can take 1000 IU (97mg) Vit D3 and 100-500 mg of Vit C daily per instructions  -follow up with your doctor in 2-3 days unless improving and feeling better  -stay home while sick, except to seek medical care. If you have COVID19, you will likely be contagious for 7-10 days. Flu or Influenza is likely contagious for about 7 days. Other respiratory viral infections remain contagious for 5-10+ days depending on the virus and many other factors. Wear a good mask that fits snugly (such as N95 or KN95) if around others to reduce the risk of transmission.  It was nice to meet you today, and I really hope you are feeling better soon. I help [place] out with telemedicine visits on Tuesdays and Thursdays and am happy to help if you need a follow up virtual visit on those days. Otherwise, if you have any concerns or questions following this visit please schedule a follow up visit with your Primary Care doctor or seek care at a local urgent care clinic to avoid delays in care.    Seek in person care or schedule a follow up video visit promptly if your symptoms worsen, new concerns arise or you are not improving with treatment. Call 911 and/or seek emergency care if your symptoms are severe or life threatening.  See the following link for the most recent information regarding Paxlovid:  [email or web address]   Nirmatrelvir; Ritonavir Tablets What is this medication? NIRMATRELVIR; RITONAVIR (NIR ma TREL vir; ri TOE na veer) treats mild to moderate COVID-19. It may help people who are at high risk of developing severe illness. This medication works by limiting the spread of the virus in your body. The FDA has allowed the emergency use of this  medication. This medicine may be used for other purposes; ask your health care provider or pharmacist if you have questions. COMMON BRAND NAME(S): PAXLOVID What should I tell my care team before I take this medication? They need to know if you have any of these conditions: Any allergies Any serious illness Kidney disease Liver disease An unusual or allergic reaction to nirmatrelvir, ritonavir, other medications, foods, dyes, or preservatives Pregnant or trying to get pregnant Breast-feeding How should I use this medication? This product contains 2 different medications that are packaged together. For the standard dose, take 2 pink tablets of nirmatrelvir with 1 white tablet of ritonavir (3 tablets total) by mouth with water twice daily. Talk to your care team if you have kidney disease. You may need a different dose. Swallow the tablets whole. You can take it with or without food. If it upsets your stomach, take it with food. Take all of this medication unless your care team tells you to stop it early. Keep taking it even if you think you are better. Talk to your care team about the use of this medication in children. While it may be prescribed for children as young as 12 years for selected conditions, precautions do apply. Overdosage: If you think you have taken too much of this medicine contact a poison control center or emergency room at once. NOTE: This medicine is only for you. Do not share this medicine with others. What if I miss a dose? If you miss a dose, take it as soon as you can unless it is more than 8 hours late. If it is more than 8 hours late, skip the missed dose. Take the next dose at the normal time. Do not take extra or 2 doses at the same time to make up for the missed dose. What may interact with this medication? Do not take this medication with any of the following medications: Alfuzosin Certain medications for anxiety or sleep like midazolam, triazolam Certain  medications for cancer like apalutamide, enzalutamide Certain medications for cholesterol like lovastatin, simvastatin Certain medications for irregular heart beat like amiodarone, dronedarone, flecainide, propafenone, quinidine Certain medications for pain like meperidine, piroxicam Certain medications for psychotic disorders like clozapine, lurasidone, pimozide Certain medications for seizures like carbamazepine, phenobarbital, phenytoin Colchicine Eletriptan Eplerenone Ergot alkaloids like dihydroergotamine, ergonovine, ergotamine, methylergonovine Finerenone Flibanserin Ivabradine Lomitapide Naloxegol Ranolazine Rifampin Sildenafil Silodosin St. John's Wort Tolvaptan Ubrogepant Voclosporin This medication may also interact with the following medications: Bedaquiline Birth control pills Bosentan Certain antibiotics like erythromycin or clarithromycin Certain medications for blood pressure like amlodipine, diltiazem, felodipine, nicardipine, nifedipine Certain medications for cancer like abemaciclib, ceritinib, dasatinib, encorafenib, ibrutinib, ivosidenib, neratinib, nilotinib, venetoclax, vinblastine, vincristine Certain medications for cholesterol like atorvastatin, rosuvastatin Certain medications for depression like bupropion, trazodone Certain medications for fungal infections like isavuconazonium, itraconazole, ketoconazole, voriconazole Certain medications for hepatitis C like elbasvir; grazoprevir, dasabuvir; ombitasvir; paritaprevir; ritonavir, glecaprevir; pibrentasvir, sofosbuvir; velpatasvir; voxilaprevir Certain medications for HIV or AIDS Certain medications for irregular heartbeat like lidocaine Certain medications that treat or prevent blood clots like rivaroxaban, warfarin Digoxin  Fentanyl Medications that lower your chance of fighting infection like cyclosporine, sirolimus, tacrolimus Methadone Quetiapine Rifabutin Salmeterol Steroid medications like  betamethasone, budesonide, ciclesonide, dexamethasone, fluticasone, methylprednisone, mometasone, triamcinolone This list may not describe all possible interactions. Give your health care provider a list of all the medicines, herbs, non-prescription drugs, or dietary supplements you use. Also tell them if you smoke, drink alcohol, or use illegal drugs. Some items may interact with your medicine. What should I watch for while using this medication? Your condition will be monitored carefully while you are receiving this medication. Visit your care team for regular checkups. Tell your care team if your symptoms do not start to get better or if they get worse. If you have untreated HIV infection, this medication may lead to some HIV medications not working as well in the future. Birth control may not work properly while you are taking this medication. Talk to your care team about using an extra method of birth control. What side effects may I notice from receiving this medication? Side effects that you should report to your care team as soon as possible: Allergic reactions--skin rash, itching, hives, swelling of the face, lips, tongue, or throat Liver injury--right upper belly pain, loss of appetite, nausea, light-colored stool, dark yellow or brown urine, yellowing skin or eyes, unusual weakness or fatigue Redness, blistering, peeling, or loosening of the skin, including inside the mouth Side effects that usually do not require medical attention (report these to your care team if they continue or are bothersome): Change in taste Diarrhea General discomfort and fatigue Increase in blood pressure Muscle pain Nausea Stomach pain This list may not describe all possible side effects. Call your doctor for medical advice about side effects. You may report side effects to FDA at 1-800-FDA-1088. Where should I keep my medication? Keep out of the reach of children and pets. Store at room temperature between  20 and 25 degrees C (68 and 77 degrees F). Get rid of any unused medication after the expiration date. To get rid of medications that are no longer needed or have expired: Take the medication to a medication take-back program. Check with your pharmacy or law enforcement to find a location. If you cannot return the medication, check the label or package insert to see if the medication should be thrown out in the garbage or flushed down the toilet. If you are not sure, ask your care team. If it is safe to put it in the trash, take the medication out of the container. Mix the medication with cat litter, dirt, coffee grounds, or other unwanted substance. Seal the mixture in a bag or container. Put it in the trash. NOTE: This sheet is a summary. It may not cover all possible information. If you have questions about this medicine, talk to your doctor, pharmacist, or health care provider.  2022 Elsevier/Gold Standard (2021-02-06 00:00:00)

## 2022-02-02 ENCOUNTER — Telehealth: Payer: Self-pay | Admitting: Internal Medicine

## 2022-02-02 NOTE — Telephone Encounter (Signed)
Patient will take paxlovid and has already been made aware not to take rosuvastatin at this time.

## 2022-02-02 NOTE — Telephone Encounter (Signed)
Pt c/o medication issue:  1. Name of Medication: Paxlovid   2. How are you currently taking this medication (dosage and times per day)?    3. Are you having a reaction (difficulty breathing--STAT)? No   4. What is your medication issue? Patient has covid and prescribe the above medication. Calling to see if the medication is okay for them to take. Please advise

## 2022-03-08 ENCOUNTER — Telehealth: Payer: Self-pay | Admitting: Family Medicine

## 2022-03-08 NOTE — Telephone Encounter (Signed)
Tried calling patient to schedule Medicare Annual Wellness Visit (AWV) either virtually or in office.  No answer   Last AWV 03/07/21 please schedule with Nurse Health Adviser   45 min for awv-i and in office appointments 30 min for awv-s  phone/virtual appointments

## 2022-03-21 ENCOUNTER — Encounter: Payer: PPO | Admitting: Family Medicine

## 2022-04-13 ENCOUNTER — Other Ambulatory Visit: Payer: Self-pay | Admitting: Internal Medicine

## 2022-04-13 DIAGNOSIS — I10 Essential (primary) hypertension: Secondary | ICD-10-CM

## 2022-04-13 DIAGNOSIS — E785 Hyperlipidemia, unspecified: Secondary | ICD-10-CM

## 2022-04-13 DIAGNOSIS — I25118 Atherosclerotic heart disease of native coronary artery with other forms of angina pectoris: Secondary | ICD-10-CM

## 2022-07-05 ENCOUNTER — Telehealth: Payer: Self-pay | Admitting: Internal Medicine

## 2022-07-05 MED ORDER — FAMOTIDINE 20 MG PO TABS: 20.0000 mg | ORAL_TABLET | Freq: Two times a day (BID) | ORAL | 0 refills | Status: DC | PRN

## 2022-07-05 NOTE — Telephone Encounter (Signed)
*  STAT* If patient is at the pharmacy, call can be transferred to refill team.   1. Which medications need to be refilled? (please list name of each medication and dose if known)   famotidine (PEPCID) 20 MG tablet    2. Which pharmacy/location (including street and city if local pharmacy) is medication to be sent to?  [id number] [place]   3. Do they need a 30 day or 90 day supply? 90 day

## 2022-07-10 ENCOUNTER — Ambulatory Visit (INDEPENDENT_AMBULATORY_CARE_PROVIDER_SITE_OTHER): Payer: PPO

## 2022-07-10 VITALS — Ht 71.0 in | Wt 204.0 lb

## 2022-07-10 DIAGNOSIS — Z0000 Encounter for general adult medical examination without abnormal findings: Secondary | ICD-10-CM | POA: Diagnosis not present

## 2022-07-10 NOTE — Progress Notes (Signed)
I connected with  Larkin Ina on 07/10/22 by a audio enabled telemedicine application and verified that I am speaking with the correct person using two identifiers.  Patient Location: Home  Provider Location: Home Office  I discussed the limitations of evaluation and management by telemedicine. The patient expressed understanding and agreed to proceed.  Subjective:   Martin Garza is a 72 y.o. male who presents for Medicare Annual/Subsequent preventive examination.  Review of Systems     Cardiac Risk Factors include: advanced age (>89mn, >>22women);dyslipidemia;hypertension;male gender;sedentary lifestyle     Objective:    Today's Vitals   07/10/22 1512 07/10/22 1513  Weight: 204 lb (92.5 kg)   Height: 5' 11"$  (1.803 m)   PainSc:  0-No pain   Body mass index is 28.45 kg/m.     07/10/2022    3:22 PM 11/09/2018    4:24 PM 05/30/2018    8:50 AM 05/28/2018    1:31 PM 02/13/2018   10:11 AM 02/08/2017    8:24 AM 02/01/2016    8:41 AM  Advanced Directives  Does Patient Have a Medical Advance Directive? Yes No Yes Yes Yes Yes No  Type of [place]aramed[place]of [place]ids[place] will  Living will;Healthcare Power of Attorney Living will;Healthcare Power of [place] will [place] will   Does patient want to make changes to medical advance directive?    No - Patient declined     Copy of [place] Chart? No - copy requested  No - copy requested No - copy requested No - copy requested No - copy requested   Would patient like information on creating a medical advance directive?  No - Patient declined     Yes - Educational materials given    Current Medications (verified) Outpatient Encounter Medications as of 07/10/2022  Medication Sig   aspirin EC 81 MG EC tablet Take 1 tablet (81 mg total) by mouth daily.   Cyanocobalamin (B-12 PO) Take 500 mcg by mouth daily.    ezetimibe (ZETIA) 10  MG tablet TAKE 1 TABLET (10 MG TOTAL) BY MOUTH DAILY. PLEASE SCHEDULE AN APPOINTMENT FOR FURTHER REFILLS   famotidine (PEPCID) 20 MG tablet Take 1 tablet (20 mg total) by mouth 2 (two) times daily as needed for heartburn or indigestion.   nitroGLYCERIN (NITROSTAT) 0.4 MG SL tablet Place 1 tablet (0.4 mg total) under the tongue every 5 (five) minutes x 3 doses as needed for chest pain.   rosuvastatin (CRESTOR) 40 MG tablet TAKE 1 TABLET BY MOUTH EVERY DAY   valsartan (DIOVAN) 160 MG tablet TAKE 1 TABLET BY MOUTH EVERY DAY   vitamin C (ASCORBIC ACID) 500 MG tablet Take 1 tablet (500 mg total) by mouth every Monday, Wednesday, and Friday.   benzonatate (TESSALON PERLES) 100 MG capsule 1-2 capsules up to twice daily as needed for cough (Patient not taking: Reported on 07/10/2022)   No facility-administered encounter medications on file as of 07/10/2022.    Allergies (verified) Lisinopril   History: Past Medical History:  Diagnosis Date   Allergy    CAD (coronary artery disease)    a. 10/2018 NSTEMI/PCI: LM nl, LAD 95p (3.0x12 Resolute Onyx DES), 654m85d (2.0x18 Resolute Onyx DES), LCX nl, RCA large, 20ost, 30d.   Cataract    had surg in both eyes   Complication of anesthesia    Blood pressure goes up after waking up past sedation/ gets dizzy too per pt!  Essential hypertension    Hyperlipidemia LDL goal <70    Ischemic cardiomyopathy    a. 10/2018 Echo: EF 55-60%, sev apical septal, apical, and ant HK. DD. Nl RV fxn.   Medical history non-contributory    Retinal detachment 123456   left   Umbilical hernia    Past Surgical History:  Procedure Laterality Date   CARDIAC CATHETERIZATION     CATARACT EXTRACTION  2018   Bil   COLONOSCOPY  04/2018   TA, SSP, diverticulosis, rpt 3 yrs (Danis); with polypectom y   CORONARY STENT INTERVENTION N/A 11/10/2018   Procedure: CORONARY STENT INTERVENTION;  Surgeon: Nelva Bush, MD;  Location: [facility];  Service: Cardiovascular;   Laterality: N/A;   LEFT HEART CATH AND CORONARY ANGIOGRAPHY N/A 11/10/2018   Procedure: LEFT HEART CATH AND CORONARY ANGIOGRAPHY;  Surgeon: Nelva Bush, MD;  Location: [facility];  Service: Cardiovascular;  Laterality: N/A;   Left inguinal repair  in [place] Left    x 2 in [id number]   UMBILICAL HERNIA REPAIR N/A 05/30/2018   Procedure: LAPAROSCOPIC ASSISTED  UMBILICAL HERNIA REPAIR WITH MESH,  ERAS PATHWAY;  Surgeon: Johnathan Hausen, MD;  Location: [facility];  Service: General;  Laterality: N/A;   Family History  Problem Relation Age of Onset   Cancer Mother 58       metastatic cancer, smoker   Coronary artery disease Father 16       MI with CABG, smoker   Stroke Father        after catheterization   Alcohol abuse Father    Alcohol abuse Brother    Coronary artery disease Brother 47       CABG, smoker   Depression Brother    Drug abuse Brother    Hearing loss Daughter    Cancer Maternal Aunt        breast   Breast cancer Maternal Aunt    Diabetes Neg Hx    Colon cancer Neg Hx    Rectal cancer Neg Hx    Stomach cancer Neg Hx    Social History   Socioeconomic History   Marital status: Married    Spouse name: Not on file   Number of children: 1   Years of education: Not on file   Highest education level: Not on file  Occupational History   Occupation: Adult nurse      Comment: of a [employer] company  Tobacco Use   Smoking status: Never   Smokeless tobacco: Former    Types: Chew    Quit date: 05/21/1986  Vaping Use   Vaping Use: Never used  Substance and Sexual Activity   Alcohol use: Not Currently    Alcohol/week: 1.0 standard drink of alcohol    Types: 1 Glasses of wine per week    Comment: rare   Drug use: No   Sexual activity: Yes  Other Topics Concern   Not on file  Social History Narrative   Caffeine: 3 cups coffee/day, some unsweet tea Lives with wife, 1 dog.  one grown daughterOccupation: IT sales professional: no regular exercise.  yardwork.Diet: some water, fruits/vegetables daily, avoids fried foods   Social Determinants of Health   Financial Resource Strain: Low Risk  (07/10/2022)   Overall Financial Resource Strain (CARDIA)    Difficulty of Paying Living Expenses: Not hard at all  Food Insecurity: No Food Insecurity (07/10/2022)   Hunger Vital Sign    Worried About Running  Out of Food in the Last Year: Never true    Ran Out of Food in the Last Year: Never true  Transportation Needs: No Transportation Needs (07/10/2022)   PRAPARE - Hydrologist (Medical): No    Lack of Transportation (Non-Medical): No  Physical Activity: Inactive (07/10/2022)   Exercise Vital Sign    Days of Exercise per Week: 0 days    Minutes of Exercise per Session: 0 min  Stress: No Stress Concern Present (07/10/2022)   [place]    Feeling of Stress : Not at all  Social Connections: Not on file    Tobacco Counseling Counseling given: Not Answered   Clinical Intake:  Pre-visit preparation completed: Yes  Pain : No/denies pain Pain Score: 0-No pain     Nutritional Risks: None Diabetes: No  How often do you need to have someone help you when you read instructions, pamphlets, or other written materials from your doctor or pharmacy?: 1 - Never  Diabetic? no  Interpreter Needed?: No  Information entered by :: C.Daytona Hedman LPN   Activities of Daily Living    07/10/2022    3:23 PM  In your present state of health, do you have any difficulty performing the following activities:  Hearing? 0  Vision? 0  Difficulty concentrating or making decisions? 0  Walking or climbing stairs? 0  Dressing or bathing? 0  Doing errands, shopping? 0  Preparing Food and eating ? N  Using the Toilet? N  In the past six months, have you accidently leaked urine? N  Do you have problems with loss of bowel control? N   Managing your Medications? N  Managing your Finances? N  Housekeeping or managing your Housekeeping? N    Patient Care Team: Ria Bush, MD as PCP - General (Family Medicine) End, Harrell Gave, MD as PCP - Cardiology (Cardiology) Leandrew Koyanagi, MD as Referring Physician (Ophthalmology)  Indicate any recent Medical Services you may have received from other than [facility] providers in the past year (date may be approximate).     Assessment:   This is a routine wellness examination for Martin Garza.  Hearing/Vision screen Hearing Screening - Comments:: No aids Vision Screening - Comments:: Readers - Dr.Bowman  Dietary issues and exercise activities discussed: Current Exercise Habits: The patient does not participate in regular exercise at present;The patient has a physically strenuous job, but has no regular exercise apart from work., Exercise limited by: None identified   Goals Addressed             This Visit's Progress    Patient Stated       Win lottery.       Depression Screen    07/10/2022    3:20 PM 03/07/2021   10:55 AM 02/13/2018   10:08 AM 02/08/2017    8:24 AM 02/01/2016    8:40 AM 01/31/2015    4:25 PM 01/29/2014    3:48 PM  PHQ 2/9 Scores  PHQ - 2 Score 0 0 0 0 0 0 0  PHQ- 9 Score   0 0       Fall Risk    07/10/2022    3:16 PM 03/07/2021   10:54 AM 02/13/2018   10:08 AM 02/08/2017    8:24 AM 02/01/2016    8:40 AM  Fall Risk   Falls in the past year? 0 0 No No No  Number falls in past yr: 0  Injury with Fall? 0      Risk for fall due to : No Fall Risks      Follow up Falls prevention discussed;Falls evaluation completed        FALL RISK PREVENTION PERTAINING TO THE HOME:  Any stairs in or around the home? Yes  If so, are there any without handrails? Yes  Home free of loose throw rugs in walkways, pet beds, electrical cords, etc? Yes  Adequate lighting in your home to reduce risk of falls? Yes   ASSISTIVE DEVICES UTILIZED TO PREVENT  FALLS:  Life alert? No  Use of a cane, walker or w/c? No  Grab bars in the bathroom? No  Shower chair or bench in shower? No  Elevated toilet seat or a handicapped toilet? No    Cognitive Function:    02/13/2018   10:27 AM 02/08/2017    8:26 AM 02/01/2016    8:39 AM  MMSE - Mini Mental State Exam  Orientation to time 5 5 5  $ Orientation to Place 5 5 5  $ Registration 3 3 3  $ Attention/ Calculation 0 0 0  Recall 3 3 3  $ Language- name 2 objects 0 0 0  Language- repeat 1 1 1  $ Language- follow 3 step command 3 3 3  $ Language- read & follow direction 0 0 0  Write a sentence 0 0 0  Copy design 0 0 0  Total score 20 20 20        $ 07/10/2022    3:31 PM  6CIT Screen  What Year? 0 points  What month? 0 points  What time? 0 points  Count back from 20 0 points  Months in reverse 0 points  Repeat phrase 0 points  Total Score 0 points    Immunizations Immunization History  Administered Date(s) Administered   PFIZER(Purple Top)SARS-COV-2 Vaccination 06/30/2019, 07/21/2019, 06/03/2020   Pneumococcal Conjugate-13 01/31/2015   Pneumococcal Polysaccharide-23 02/01/2016   Td 02/24/2004   Tdap 08/29/2011    TDAP status: Due, Education has been provided regarding the importance of this vaccine. Advised may receive this vaccine at local pharmacy or Health Dept. Aware to provide a copy of the vaccination record if obtained from local pharmacy or Health Dept. Verbalized acceptance and understanding.  Flu Vaccine status: Declined, Education has been provided regarding the importance of this vaccine but patient still declined. Advised may receive this vaccine at local pharmacy or Health Dept. Aware to provide a copy of the vaccination record if obtained from local pharmacy or Health Dept. Verbalized acceptance and understanding.  Pneumococcal vaccine status: Up to date  Covid-19 vaccine status: Completed vaccines  Qualifies for Shingles Vaccine? Yes   Zostavax completed No   Shingrix  Completed?: No.    Education has been provided regarding the importance of this vaccine. Patient has been advised to call insurance company to determine out of pocket expense if they have not yet received this vaccine. Advised may also receive vaccine at local pharmacy or Health Dept. Verbalized acceptance and understanding.  Screening Tests Health Maintenance  Topic Date Due   Zoster Vaccines- Shingrix (1 of 2) Never done   COLONOSCOPY (Pts 45-59yr Insurance coverage will need to be confirmed)  04/11/2021   DTaP/Tdap/Td (3 - Td or Tdap) 08/28/2021   COVID-19 Vaccine (4 - 2023-24 season) 01/19/2022   INFLUENZA VACCINE  02/13/2049 (Originally 12/19/2021)   Medicare Annual Wellness (AWV)  07/11/2023   Pneumonia Vaccine 72 Years old  Completed   Hepatitis C Screening  Completed  HPV VACCINES  Aged Out    Health Maintenance  Health Maintenance Due  Topic Date Due   Zoster Vaccines- Shingrix (1 of 2) Never done   COLONOSCOPY (Pts 45-92yr Insurance coverage will need to be confirmed)  04/11/2021   DTaP/Tdap/Td (3 - Td or Tdap) 08/28/2021   COVID-19 Vaccine (4 - 2023-24 season) 01/19/2022   Colonoscopy - Pt declined  Lung Cancer Screening: (Low Dose CT Chest recommended if Age 74-80years, 30 pack-year currently smoking OR have quit w/in 15years.) does not qualify.   Lung Cancer Screening Referral: no  Additional Screening:  Hepatitis C Screening: does not qualify; Completed 02/01/2016  Vision Screening: Recommended annual ophthalmology exams for early detection of glaucoma and other disorders of the eye. Is the patient up to date with their annual eye exam?  Yes  Who is the provider or what is the name of the office in which the patient attends annual eye exams? Dr.Bowman If pt is not established with a provider, would they like to be referred to a provider to establish care? No .   Dental Screening: Recommended annual dental exams for proper oral hygiene  Community Resource  Referral / Chronic Care Management: CRR required this visit?  No   CCM required this visit?  No      Plan:     I have personally reviewed and noted the following in the patient's chart:   Medical and social history Use of alcohol, tobacco or illicit drugs  Current medications and supplements including opioid prescriptions. Patient is not currently taking opioid prescriptions. Functional ability and status Nutritional status Physical activity Advanced directives List of other physicians Hospitalizations, surgeries, and ER visits in previous 12 months Vitals Screenings to include cognitive, depression, and falls Referrals and appointments  In addition, I have reviewed and discussed with patient certain preventive protocols, quality metrics, and best practice recommendations. A written personalized care plan for preventive services as well as general preventive health recommendations were provided to patient.     CLebron Conners LPN   [id number]  Nurse Notes: Pt declined colonoscopy. Pt Declined Flu shot and shingles vaccine.

## 2022-07-10 NOTE — Patient Instructions (Signed)
Martin Garza , Thank you for taking time to come for your Medicare Wellness Visit. I appreciate your ongoing commitment to your health goals. Please review the following plan we discussed and let me know if I can assist you in the future.   These are the goals we discussed:  Goals      Increase physical activity     Starting 02/13/2018, I will continue to walk at least 3 miles daily.      Patient Stated     Win lottery.        This is a list of the screening recommended for you and due dates:  Health Maintenance  Topic Date Due   Zoster (Shingles) Vaccine (1 of 2) Never done   Colon Cancer Screening  04/11/2021   DTaP/Tdap/Td vaccine (3 - Td or Tdap) 08/28/2021   COVID-19 Vaccine (4 - 2023-24 season) 01/19/2022   Flu Shot  02/13/2049*   Medicare Annual Wellness Visit  07/11/2023   Pneumonia Vaccine  Completed   Hepatitis C Screening: USPSTF Recommendation to screen - Ages 18-79 yo.  Completed   HPV Vaccine  Aged Out  *Topic was postponed. The date shown is not the original due date.    Advanced directives: Advance directive discussed with you today, copy requested.  Conditions/risks identified: Aim for 30 minutes of exercise or brisk walking, 6-8 glasses of water, and 5 servings of fruits and vegetables each day.   Next appointment: Follow up in one year for your annual wellness visit. 07/17/2023 @ 3:15pm via telephone.  Preventive Care 45 Years and Older, Male  Preventive care refers to lifestyle choices and visits with your health care provider that can promote health and wellness. What does preventive care include? A yearly physical exam. This is also called an annual well check. Dental exams once or twice a year. Routine eye exams. Ask your health care provider how often you should have your eyes checked. Personal lifestyle choices, including: Daily care of your teeth and gums. Regular physical activity. Eating a healthy diet. Avoiding tobacco and drug use. Limiting  alcohol use. Practicing safe sex. Taking low doses of aspirin every day. Taking vitamin and mineral supplements as recommended by your health care provider. What happens during an annual well check? The services and screenings done by your health care provider during your annual well check will depend on your age, overall health, lifestyle risk factors, and family history of disease. Counseling  Your health care provider may ask you questions about your: Alcohol use. Tobacco use. Drug use. Emotional well-being. Home and relationship well-being. Sexual activity. Eating habits. History of falls. Memory and ability to understand (cognition). Work and work Statistician. Screening  You may have the following tests or measurements: Height, weight, and BMI. Blood pressure. Lipid and cholesterol levels. These may be checked every 5 years, or more frequently if you are over 66 years old. Skin check. Lung cancer screening. You may have this screening every year starting at age 11 if you have a 30-pack-year history of smoking and currently smoke or have quit within the past 15 years. Fecal occult blood test (FOBT) of the stool. You may have this test every year starting at age 4. Flexible sigmoidoscopy or colonoscopy. You may have a sigmoidoscopy every 5 years or a colonoscopy every 10 years starting at age 53. Prostate cancer screening. Recommendations will vary depending on your family history and other risks. Hepatitis C blood test. Hepatitis B blood test. Sexually transmitted disease (STD) testing.  Diabetes screening. This is done by checking your blood sugar (glucose) after you have not eaten for a while (fasting). You may have this done every 1-3 years. Abdominal aortic aneurysm (AAA) screening. You may need this if you are a current or former smoker. Osteoporosis. You may be screened starting at age 50 if you are at high risk. Talk with your health care provider about your test results,  treatment options, and if necessary, the need for more tests. Vaccines  Your health care provider may recommend certain vaccines, such as: Influenza vaccine. This is recommended every year. Tetanus, diphtheria, and acellular pertussis (Tdap, Td) vaccine. You may need a Td booster every 10 years. Zoster vaccine. You may need this after age 71. Pneumococcal 13-valent conjugate (PCV13) vaccine. One dose is recommended after age 36. Pneumococcal polysaccharide (PPSV23) vaccine. One dose is recommended after age 86. Talk to your health care provider about which screenings and vaccines you need and how often you need them. This information is not intended to replace advice given to you by your health care provider. Make sure you discuss any questions you have with your health care provider. Document Released: 06/03/2015 Document Revised: 01/25/2016 Document Reviewed: 03/08/2015 Elsevier Interactive Patient Education  2017 [place] Prevention in the Home Falls can cause injuries. They can happen to people of all ages. There are many things you can do to make your home safe and to help prevent falls. What can I do on the outside of my home? Regularly fix the edges of walkways and driveways and fix any cracks. Remove anything that might make you trip as you walk through a door, such as a raised step or threshold. Trim any bushes or trees on the path to your home. Use bright outdoor lighting. Clear any walking paths of anything that might make someone trip, such as rocks or tools. Regularly check to see if handrails are loose or broken. Make sure that both sides of any steps have handrails. Any raised decks and porches should have guardrails on the edges. Have any leaves, snow, or ice cleared regularly. Use sand or salt on walking paths during winter. Clean up any spills in your garage right away. This includes oil or grease spills. What can I do in the bathroom? Use night  lights. Install grab bars by the toilet and in the tub and shower. Do not use towel bars as grab bars. Use non-skid mats or decals in the tub or shower. If you need to sit down in the shower, use a plastic, non-slip stool. Keep the floor dry. Clean up any water that spills on the floor as soon as it happens. Remove soap buildup in the tub or shower regularly. Attach bath mats securely with double-sided non-slip rug tape. Do not have throw rugs and other things on the floor that can make you trip. What can I do in the bedroom? Use night lights. Make sure that you have a light by your bed that is easy to reach. Do not use any sheets or blankets that are too big for your bed. They should not hang down onto the floor. Have a firm chair that has side arms. You can use this for support while you get dressed. Do not have throw rugs and other things on the floor that can make you trip. What can I do in the kitchen? Clean up any spills right away. Avoid walking on wet floors. Keep items that you use a lot in easy-to-reach  places. If you need to reach something above you, use a strong step stool that has a grab bar. Keep electrical cords out of the way. Do not use floor polish or wax that makes floors slippery. If you must use wax, use non-skid floor wax. Do not have throw rugs and other things on the floor that can make you trip. What can I do with my stairs? Do not leave any items on the stairs. Make sure that there are handrails on both sides of the stairs and use them. Fix handrails that are broken or loose. Make sure that handrails are as long as the stairways. Check any carpeting to make sure that it is firmly attached to the stairs. Fix any carpet that is loose or worn. Avoid having throw rugs at the top or bottom of the stairs. If you do have throw rugs, attach them to the floor with carpet tape. Make sure that you have a light switch at the top of the stairs and the bottom of the stairs. If  you do not have them, ask someone to add them for you. What else can I do to help prevent falls? Wear shoes that: Do not have high heels. Have rubber bottoms. Are comfortable and fit you well. Are closed at the toe. Do not wear sandals. If you use a stepladder: Make sure that it is fully opened. Do not climb a closed stepladder. Make sure that both sides of the stepladder are locked into place. Ask someone to hold it for you, if possible. Clearly mark and make sure that you can see: Any grab bars or handrails. First and last steps. Where the edge of each step is. Use tools that help you move around (mobility aids) if they are needed. These include: Canes. Walkers. Scooters. Crutches. Turn on the lights when you go into a dark area. Replace any light bulbs as soon as they burn out. Set up your furniture so you have a clear path. Avoid moving your furniture around. If any of your floors are uneven, fix them. If there are any pets around you, be aware of where they are. Review your medicines with your doctor. Some medicines can make you feel dizzy. This can increase your chance of falling. Ask your doctor what other things that you can do to help prevent falls. This information is not intended to replace advice given to you by your health care provider. Make sure you discuss any questions you have with your health care provider. Document Released: 03/03/2009 Document Revised: 10/13/2015 Document Reviewed: 06/11/2014 Elsevier Interactive Patient Education  2017 [employer].

## 2022-08-28 ENCOUNTER — Encounter: Payer: PPO | Admitting: Family Medicine

## 2022-09-05 ENCOUNTER — Encounter: Payer: Self-pay | Admitting: Family Medicine

## 2022-09-05 ENCOUNTER — Ambulatory Visit (INDEPENDENT_AMBULATORY_CARE_PROVIDER_SITE_OTHER): Payer: PPO | Admitting: Family Medicine

## 2022-09-05 VITALS — BP 132/70 | HR 71 | Temp 97.3°F | Ht 69.0 in | Wt 206.1 lb

## 2022-09-05 DIAGNOSIS — Z0000 Encounter for general adult medical examination without abnormal findings: Secondary | ICD-10-CM | POA: Diagnosis not present

## 2022-09-05 DIAGNOSIS — I2510 Atherosclerotic heart disease of native coronary artery without angina pectoris: Secondary | ICD-10-CM | POA: Diagnosis not present

## 2022-09-05 DIAGNOSIS — Z1211 Encounter for screening for malignant neoplasm of colon: Secondary | ICD-10-CM

## 2022-09-05 DIAGNOSIS — R1013 Epigastric pain: Secondary | ICD-10-CM | POA: Diagnosis not present

## 2022-09-05 DIAGNOSIS — N429 Disorder of prostate, unspecified: Secondary | ICD-10-CM

## 2022-09-05 DIAGNOSIS — I255 Ischemic cardiomyopathy: Secondary | ICD-10-CM | POA: Diagnosis not present

## 2022-09-05 DIAGNOSIS — I700 Atherosclerosis of aorta: Secondary | ICD-10-CM | POA: Diagnosis not present

## 2022-09-05 DIAGNOSIS — Z7189 Other specified counseling: Secondary | ICD-10-CM

## 2022-09-05 DIAGNOSIS — M79672 Pain in left foot: Secondary | ICD-10-CM

## 2022-09-05 DIAGNOSIS — I10 Essential (primary) hypertension: Secondary | ICD-10-CM

## 2022-09-05 DIAGNOSIS — E785 Hyperlipidemia, unspecified: Secondary | ICD-10-CM | POA: Diagnosis not present

## 2022-09-05 LAB — LIPID PANEL
Cholesterol: 126 mg/dL (ref 0–200)
HDL: 46 mg/dL (ref >39.00)
NonHDL: 79.93
Total CHOL/HDL Ratio: 3
Triglycerides: 217 mg/dL — ABNORMAL HIGH (ref 0.0–149.0)
VLDL: 43.4 mg/dL — ABNORMAL HIGH (ref 0.0–40.0)

## 2022-09-05 LAB — COMPREHENSIVE METABOLIC PANEL
ALT: 23 U/L (ref 0–53)
AST: 23 U/L (ref 0–37)
Albumin: 4.4 g/dL (ref 3.5–5.2)
Alkaline Phosphatase: 41 U/L (ref 39–117)
BUN: 16 mg/dL (ref 6–23)
CO2: 28 mEq/L (ref 19–32)
Calcium: 9.2 mg/dL (ref 8.4–10.5)
Chloride: 104 mEq/L (ref 96–112)
Creatinine, Ser: 1.11 mg/dL (ref 0.40–1.50)
GFR: 65.8 mL/min (ref >60.00)
Glucose, Bld: 96 mg/dL (ref 70–99)
Potassium: 4.1 mEq/L (ref 3.5–5.1)
Sodium: 142 mEq/L (ref 135–145)
Total Bilirubin: 0.5 mg/dL (ref 0.2–1.2)
Total Protein: 6.6 g/dL (ref 6.0–8.3)

## 2022-09-05 LAB — CBC WITH DIFFERENTIAL/PLATELET
Basophils Absolute: 0 10*3/uL (ref 0.0–0.1)
Basophils Relative: 0.8 % (ref 0.0–3.0)
Eosinophils Absolute: 0.3 10*3/uL (ref 0.0–0.7)
Eosinophils Relative: 5.2 % — ABNORMAL HIGH (ref 0.0–5.0)
HCT: 41.4 % (ref 39.0–52.0)
Hemoglobin: 14.1 g/dL (ref 13.0–17.0)
Lymphocytes Relative: 26.3 % (ref 12.0–46.0)
Lymphs Abs: 1.6 10*3/uL (ref 0.7–4.0)
MCHC: 34.1 g/dL (ref 30.0–36.0)
MCV: 96.5 fl (ref 78.0–100.0)
Monocytes Absolute: 0.8 10*3/uL (ref 0.1–1.0)
Monocytes Relative: 13.1 % — ABNORMAL HIGH (ref 3.0–12.0)
Neutro Abs: 3.4 10*3/uL (ref 1.4–7.7)
Neutrophils Relative %: 54.6 % (ref 43.0–77.0)
Platelets: 206 10*3/uL (ref 150.0–400.0)
RBC: 4.29 Mil/uL (ref 4.22–5.81)
RDW: 13.1 % (ref 11.5–15.5)
WBC: 6.2 10*3/uL (ref 4.0–10.5)

## 2022-09-05 LAB — PSA: PSA: 1.09 ng/mL (ref 0.10–4.00)

## 2022-09-05 LAB — LDL CHOLESTEROL, DIRECT: Direct LDL: 67 mg/dL

## 2022-09-05 LAB — TSH: TSH: 1.12 u[IU]/mL (ref 0.35–5.50)

## 2022-09-05 NOTE — Assessment & Plan Note (Signed)
Chronic, stable. Continue current regimen. 

## 2022-09-05 NOTE — Assessment & Plan Note (Signed)
Chronic, update FLP today on crestor and zetia. The ASCVD Risk score (Arnett DK, et al., 2019) failed to calculate for the following reasons:   The valid total cholesterol range is 130 to 320 mg/dL

## 2022-09-05 NOTE — Assessment & Plan Note (Signed)
Preventative protocols reviewed and updated unless pt declined. Discussed healthy diet and lifestyle.  

## 2022-09-05 NOTE — Assessment & Plan Note (Signed)
Longstanding, ongoing. Manages with pepcid  daily with benefit. No red flags.

## 2022-09-05 NOTE — Assessment & Plan Note (Signed)
Suspicious for plantar fasciitis vs heel spur.  Supportive measures reviewed.  Update if not improving to consider foot xray and/or podiatry eval

## 2022-09-05 NOTE — Progress Notes (Signed)
Ph: [phone]       Fax: [phone]   Patient ID: Martin Garza, male    DOB: 1950-08-28, 72 y.o.   MRN: [id number]  This visit was conducted in person.  BP 132/70   Pulse 71   Temp (!) 97.3 F (36.3 C) (Temporal)   Ht  (1.753 m)   Wt 206 lb 2 oz (93.5 kg)   SpO2 98%   BMI 30.44 kg/m    CC: CPE Subjective:   HPI: Martin Garza is a 72 y.o. male presenting on 09/05/2022 for Annual Exam (MCR prt 2 [AWV- 07/10/22]. Pt accompanied by wife, Aurther Loft.)   Saw health advisor 06/2022 for medicare wellness visit. Note reviewed.    No results found.  Flowsheet Row Clinical Support from 07/10/2022 in [facility] [place]  PHQ-2 Total Score 0          07/10/2022    3:16 PM 03/07/2021   10:54 AM 02/13/2018   10:08 AM 02/08/2017    8:24 AM 02/01/2016    8:40 AM  Fall Risk   Falls in the past year? 0 0 No No No  Number falls in past yr: 0      Injury with Fall? 0      Risk for fall due to : No Fall Risks      Follow up Falls prevention discussed;Falls evaluation completed       Continues working full time.   Indigestion - manages with pepcid  daily in am, notes symptoms recur if he misses a dose.   Notes ongoing GI upset with significant gassiness/belching, pepcid helps. Pepto bismol causes constipation. Has tried limiting dairy with some benefit. Describes burping gassiness. He stopped acid foods, tomatoes, spicy foods. Drinks 2 cups coffee in am. Seldom alcohol. Minimal NSAID use. No dysphagia, early satiety, weight loss or vomiting. Probiotic didn't help.  No tick bite hx.  No h/o lactose intolerance.   CAD - on aspirin, crestor, zetia followed by cardiology.    Having trouble with left foot sole pain at heel for 3-4 months.   Preventative: COLONOSCOPY 04/2018 - TA, SSP, diverticulosis, rpt 3 yrs ([facility]). Discussed overdue - he requests establishing locally - will refer to  GI.  Prostate cancer screening - abnormal imaging  study with normal DRE and PSA - rec yearly screen to ensure no changes. Denies prostate symptoms. Nocturia x2. CT scan 2022 with irregularity to superior prostate however not enlarged.  Lung cancer screening - not eligible  Flu shot - declines  COVID vaccine - Pfizer 06/2019, 07/2019, booster 05/2020   Prevnar-13 92016, pneumovax 01/2016 Tdap 2005, 08/2011  Shingrix - discussed, declines  Advanced directives - would want wife Aurther Loft to be HCPOA. Has this set up at home - asked to bring [place] copy.  Seat belt use discussed Sunscreen use discussed, no suspicious moles Non smoker  Alcohol - seldom  Dentist Q6 mo Eye exam regularly  Bowel - no constipation  Bladder - no incontinence  Caffeine: 3 cups coffee/day, some unsweet tea  Lives with wife, 1 dog. one grown daughter  Occupation: Geophysical data processor  Activity: no regular exercise - walks 3 mi/day at work  Diet: some water, fruits/vegetables daily, avoids fried foods and simple carbs       Relevant past medical, surgical, family and social history reviewed and updated as indicated. Interim medical history since our last visit reviewed. Allergies and medications reviewed and updated. Outpatient Medications Prior to  Visit  Medication Sig Dispense Refill   aspirin EC 81 MG EC tablet Take 1 tablet (81 mg total) by mouth daily. 30 tablet 1   Cyanocobalamin (B-12 PO) Take 500 mcg by mouth daily.      ezetimibe (ZETIA) 10 MG tablet TAKE 1 TABLET (10 MG TOTAL) BY MOUTH DAILY. PLEASE SCHEDULE AN APPOINTMENT FOR FURTHER REFILLS 90 tablet 1   famotidine (PEPCID) 20 MG tablet Take 1 tablet (20 mg total) by mouth 2 (two) times daily as needed for heartburn or indigestion. 180 tablet 0   nitroGLYCERIN (NITROSTAT) 0.4 MG SL tablet Place 1 tablet (0.4 mg total) under the tongue every 5 (five) minutes x 3 doses as needed for chest pain. 20 tablet 2   rosuvastatin (CRESTOR) 40 MG tablet TAKE 1 TABLET BY MOUTH EVERY DAY 90 tablet 3   valsartan (DIOVAN) 160 MG  tablet TAKE 1 TABLET BY MOUTH EVERY DAY 90 tablet 1   vitamin C (ASCORBIC ACID) 500 MG tablet Take 1 tablet (500 mg total) by mouth every Monday, Wednesday, and Friday.     benzonatate (TESSALON PERLES) 100 MG capsule 1-2 capsules up to twice daily as needed for cough 30 capsule 0   No facility-administered medications prior to visit.     Per HPI unless specifically indicated in ROS section below Review of Systems  Constitutional:  Negative for activity change, appetite change, chills, fatigue, fever and unexpected weight change.  HENT:  Negative for hearing loss.   Eyes:  Negative for visual disturbance.  Respiratory:  Negative for cough, chest tightness, shortness of breath and wheezing.   Cardiovascular:  Negative for chest pain, palpitations and leg swelling.  Gastrointestinal:  Positive for abdominal pain (see HPI - indigestion). Negative for abdominal distention, blood in stool, constipation, diarrhea, nausea and vomiting.  Genitourinary:  Negative for difficulty urinating and hematuria.  Musculoskeletal:  Positive for neck pain (with limited ROM). Negative for arthralgias and myalgias.       Left plantar fasciitis  Skin:  Negative for rash.  Neurological:  Negative for dizziness, seizures, syncope and headaches.  Hematological:  Negative for adenopathy. Bruises/bleeds easily.  Psychiatric/Behavioral:  Negative for dysphoric mood. The patient is not nervous/anxious.     Objective:  BP 132/70   Pulse 71   Temp (!) 97.3 F (36.3 C) (Temporal)   Ht 5\' 9"  (1.753 m)   Wt 206 lb 2 oz (93.5 kg)   SpO2 98%   BMI 30.44 kg/m   Wt Readings from Last 3 Encounters:  09/05/22 206 lb 2 oz (93.5 kg)  07/10/22 204 lb (92.5 kg)  02/01/22 204 lb (92.5 kg)      Physical Exam Vitals and nursing note reviewed.  Constitutional:      General: He is not in acute distress.    Appearance: Normal appearance. He is well-developed. He is not ill-appearing.  HENT:     Head: Normocephalic and  atraumatic.     Right Ear: Hearing, tympanic membrane, ear canal and external ear normal.     Left Ear: Hearing, tympanic membrane, ear canal and external ear normal.     Nose: Nose normal.     Mouth/Throat:     Mouth: Mucous membranes are moist.     Pharynx: Oropharynx is clear. No oropharyngeal exudate or posterior oropharyngeal erythema.  Eyes:     General: No scleral icterus.    Extraocular Movements: Extraocular movements intact.     Conjunctiva/sclera: Conjunctivae normal.     Pupils: Pupils are  equal, round, and reactive to light.  Neck:     Thyroid: No thyroid mass or thyromegaly.     Vascular: No carotid bruit.  Cardiovascular:     Rate and Rhythm: Normal rate and regular rhythm.     Pulses: Normal pulses.          Radial pulses are 2+ on the right side and 2+ on the left side.     Heart sounds: Normal heart sounds. No murmur heard. Pulmonary:     Effort: Pulmonary effort is normal. No respiratory distress.     Breath sounds: Normal breath sounds. No wheezing, rhonchi or rales.  Abdominal:     General: Bowel sounds are normal. There is no distension.     Palpations: Abdomen is soft. There is no mass.     Tenderness: There is no abdominal tenderness. There is no guarding or rebound.     Hernia: No hernia is present.  Genitourinary:    Prostate: Normal. Not enlarged (20gm), not tender and no nodules present.     Rectum: Normal. No mass, tenderness, anal fissure, external hemorrhoid or internal hemorrhoid. Normal anal tone.  Musculoskeletal:        General: Normal range of motion.     Cervical back: Normal range of motion and neck supple.     Right lower leg: No edema.     Left lower leg: No edema.  Lymphadenopathy:     Cervical: No cervical adenopathy.  Skin:    General: Skin is warm and dry.     Findings: No rash.  Neurological:     General: No focal deficit present.     Mental Status: He is alert and oriented to person, place, and time.  Psychiatric:        Mood  and Affect: Mood normal.        Behavior: Behavior normal.        Thought Content: Thought content normal.        Judgment: Judgment normal.       Results for orders placed or performed during the hospital encounter of 12/06/21  Lipid Profile  Result Value Ref Range   Cholesterol 126 0 - 200 mg/dL   Triglycerides 086 <578 mg/dL   HDL 47 >46 mg/dL   Total CHOL/HDL Ratio 2.7 RATIO   VLDL 29 0 - 40 mg/dL   LDL Cholesterol 50 0 - 99 mg/dL  Comp Met (CMET)  Result Value Ref Range   Sodium 141 135 - 145 mmol/L   Potassium 3.7 3.5 - 5.1 mmol/L   Chloride 110 98 - 111 mmol/L   CO2 25 22 - 32 mmol/L   Glucose, Bld 119 (H) 70 - 99 mg/dL   BUN 19 8 - 23 mg/dL   Creatinine, Ser 9.62 (H) 0.61 - 1.24 mg/dL   Calcium 9.2 8.9 - 95.2 mg/dL   Total Protein 7.3 6.5 - 8.1 g/dL   Albumin 4.3 3.5 - 5.0 g/dL   AST 26 15 - 41 U/L   ALT 28 0 - 44 U/L   Alkaline Phosphatase 40 38 - 126 U/L   Total Bilirubin 0.8 0.3 - 1.2 mg/dL   GFR, Estimated 58 (L) >60 mL/min   Anion gap 6 5 - 15  CBC  Result Value Ref Range   WBC 6.8 4.0 - 10.5 K/uL   RBC 4.37 4.22 - 5.81 MIL/uL   Hemoglobin 13.9 13.0 - 17.0 g/dL   HCT 84.1 32.4 - 40.1 %   MCV 93.1  80.0 - 100.0 fL   MCH 31.8 26.0 - 34.0 pg   MCHC 34.2 30.0 - 36.0 g/dL   RDW 29.5 62.1 - 30.8 %   Platelets 191 150 - 400 K/uL   nRBC 0.0 0.0 - 0.2 %    Assessment & Plan:   Problem List Items Addressed This Visit     Advanced care planning/counseling discussion (Chronic)    Advanced directives - would want wife Aurther Loft to be HCPOA. Has this set up at home - asked to bring [place] copy.       Health maintenance examination - Primary (Chronic)    Preventative protocols reviewed and updated unless pt declined. Discussed healthy diet and lifestyle.       Hyperlipidemia LDL goal <70    Chronic, update FLP today on crestor and zetia. The ASCVD Risk score (Arnett DK, et al., 2019) failed to calculate for the following reasons:   The valid total cholesterol  range is 130 to 320 mg/dL       Relevant Orders   Lipid panel   Comprehensive metabolic panel   TSH   CBC with Differential/Platelet   Coronary artery disease involving native coronary artery of native heart without angina pectoris    Followed by cardiology - continue statin, zetia, aspirin.       Ischemic cardiomyopathy   Essential hypertension    Chronic, stable. Continue current regimen.       Prostate irregularity    DRE reassuring. Check PSA today.      Relevant Orders   PSA   Atherosclerosis of aorta    Continue statin, zetia, aspirin.       Dyspepsia    Longstanding, ongoing. Manages with pepcid  daily with benefit. No red flags.       Left foot pain    Suspicious for plantar fasciitis vs heel spur.  Supportive measures reviewed.  Update if not improving to consider foot xray and/or podiatry eval       Other Visit Diagnoses     Special screening for malignant neoplasms, colon       Relevant Orders   Ambulatory referral to Gastroenterology        No orders of the defined types were placed in this encounter.   Orders Placed This Encounter  Procedures   Lipid panel   Comprehensive metabolic panel   TSH   PSA   CBC with Differential/Platelet   Ambulatory referral to Gastroenterology    Referral Priority:   Routine    Referral Type:   Consultation    Referral Reason:   Specialty Services Required    Number of Visits Requested:   1    Patient Instructions  Labs today  You may call [facility] at [phone] to schedule an appointment.  Bring [place] a copy of your advanced directive/living will.  For plantar fasciitis - may try heel lift, frozen water bottle massage, and gentle stretching. If not better let us know for either xray or podiatry referral.  Good to see you today Return as needed or in 1 year for next physical.   Follow up plan: Return in about 1 year (around 09/05/2023) for annual exam, prior fasting for blood work, medicare  wellness visit.  Eustaquio Boyden, MD

## 2022-09-05 NOTE — Assessment & Plan Note (Addendum)
DRE reassuring. Check PSA today.

## 2022-09-05 NOTE — Patient Instructions (Addendum)
Labs today  You may call [facility] at [phone] to schedule an appointment.  Bring [place] a copy of your advanced directive/living will.  For plantar fasciitis - may try heel lift, frozen water bottle massage, and gentle stretching. If not better let us know for either xray or podiatry referral.  Good to see you today Return as needed or in 1 year for next physical.

## 2022-09-05 NOTE — Assessment & Plan Note (Signed)
Followed by cardiology - continue statin, zetia, aspirin.

## 2022-09-05 NOTE — Assessment & Plan Note (Signed)
Continue statin, zetia, aspirin.

## 2022-09-05 NOTE — Assessment & Plan Note (Addendum)
Advanced directives - would want wife Aurther Loft to be HCPOA. Has this set up at home - asked to bring [place] copy.

## 2022-09-25 DIAGNOSIS — Z961 Presence of intraocular lens: Secondary | ICD-10-CM | POA: Diagnosis not present

## 2022-10-01 ENCOUNTER — Other Ambulatory Visit: Payer: Self-pay | Admitting: Internal Medicine

## 2022-10-01 NOTE — Telephone Encounter (Signed)
Please advise if ok to refill non cardiac medication. 

## 2022-10-03 NOTE — Telephone Encounter (Signed)
It is fine to refill for 90 days.  However, I previously referred him to GI and I do not see that this was ever completed.  If he continues to have GERD sx, he needs to f/u with his PCP and GI.  Yvonne Kendall, MD [facility]

## 2022-10-06 ENCOUNTER — Other Ambulatory Visit: Payer: Self-pay | Admitting: Internal Medicine

## 2022-10-06 DIAGNOSIS — I10 Essential (primary) hypertension: Secondary | ICD-10-CM

## 2022-12-29 ENCOUNTER — Other Ambulatory Visit: Payer: Self-pay | Admitting: Internal Medicine

## 2023-01-01 ENCOUNTER — Other Ambulatory Visit: Payer: Self-pay | Admitting: Internal Medicine

## 2023-01-01 ENCOUNTER — Other Ambulatory Visit: Payer: Self-pay | Admitting: Family Medicine

## 2023-01-01 DIAGNOSIS — E785 Hyperlipidemia, unspecified: Secondary | ICD-10-CM

## 2023-01-01 DIAGNOSIS — I25118 Atherosclerotic heart disease of native coronary artery with other forms of angina pectoris: Secondary | ICD-10-CM

## 2023-01-06 ENCOUNTER — Ambulatory Visit
Admission: EM | Admit: 2023-01-06 | Discharge: 2023-01-06 | Disposition: A | Discharge status: Home / Self Care | Payer: PPO | Attending: Emergency Medicine | Admitting: Emergency Medicine

## 2023-01-06 DIAGNOSIS — M5442 Lumbago with sciatica, left side: Secondary | ICD-10-CM | POA: Diagnosis not present

## 2023-01-06 MED ORDER — PREDNISONE 20 MG PO TABS: 40.0000 mg | ORAL_TABLET | Freq: Every day | ORAL | 0 refills | Status: DC

## 2023-01-06 MED ORDER — CYCLOBENZAPRINE HCL 10 MG PO TABS: 10.0000 mg | ORAL_TABLET | Freq: Two times a day (BID) | ORAL | 0 refills | Status: DC | PRN

## 2023-01-06 MED ORDER — KETOROLAC TROMETHAMINE 30 MG/ML IJ SOLN
30.0000 mg | Freq: Once | INTRAMUSCULAR | Status: AC
Start: 2023-01-06 — End: 2023-01-06
  Administered 2023-01-06: 30 mg via INTRAMUSCULAR

## 2023-01-06 NOTE — Discharge Instructions (Signed)
Your pain is most likely caused by irritation to the muscles which is compressing nerves causing pain to radiate down the leg  An injection of Toradol to help reduce inflammation, ideally should start to see improvement in 30 minutes to an hour  Starting tomorrow take prednisone every morning with food for 5 days to continue the above process, may take Tylenol 500 to 1000 mg every 6 hours as needed in addition to this  You may use muscle relaxants twice daily as needed for additional comfort, be mindful this will make you feel sleepy  You may use heating pad in 15 minute intervals as needed for additional comfort,or,  you may find comfort in using ice in 10-15 minutes over affected area  Begin stretching affected area daily for 10 minutes as tolerated to further loosen muscles   When sitting and lying down place pillow underneath and between knees for support   Practice good posture: head back, shoulders back, chest forward, pelvis back and weight distributed evenly on both legs  If pain persist after recommended treatment or reoccurs if may be beneficial to follow up with orthopedic specialist for evaluation, this doctor specializes in the bones and can manage your symptoms long-term with options such as but not limited to imaging, medications or physical therapy

## 2023-01-06 NOTE — ED Provider Notes (Signed)
Martin Garza    CSN: [id number] Arrival date & time: 01/06/23  1238      History   Chief Complaint No chief complaint on file.   HPI Martin Garza is a 72 y.o. male.   Patient presents for evaluation of left-sided hip pain radiating down the left leg beginning 3 days ago.  Symptoms started abruptly while walking around [place], denies precipitating event, injury or trauma.  History of a pinched nerve to the lower back but endorses this feels different.  Pain has been constant described as a throbbing sensation with intermittent numbness.  Exacerbated by long periods of walking and standing.  Difficulty getting comfortable when lying, interfering with sleep.  Has attempted use of Advil and topical diclofenac gel which has been ineffective.  Denies urinary or bowel incontinence.  Past Medical History:  Diagnosis Date   Allergy    CAD (coronary artery disease)    a. 10/2018 NSTEMI/PCI: LM nl, LAD 95p (3.0x12 Resolute Onyx DES), 92m, 85d (2.0x18 Resolute Onyx DES), LCX nl, RCA large, 20ost, 30d.   Cataract    had surg in both eyes   Complication of anesthesia    Blood pressure goes up after waking up past sedation/ gets dizzy too per pt!   Essential hypertension    Hyperlipidemia LDL goal <70    Ischemic cardiomyopathy    a. 10/2018 Echo: EF 55-60%, sev apical septal, apical, and ant HK. DD. Nl RV fxn.   Medical history non-contributory    Retinal detachment 2014   left   Umbilical hernia     Patient Active Problem List   Diagnosis Date Noted   Left foot pain 09/05/2022   Dyspepsia 05/31/2021   Prostate irregularity 10/26/2020   Atherosclerosis of aorta (HCC) 10/26/2020   Diverticulosis 10/26/2020   Hepatic steatosis 10/26/2020   Spinal stenosis at L4-L5 level 10/26/2020   DOE (dyspnea on exertion) 09/16/2020   Fatigue 09/16/2020   Coronary artery disease involving native coronary artery of native heart without angina pectoris 12/31/2018   Ischemic  cardiomyopathy 12/31/2018   Essential hypertension 12/31/2018   Health maintenance examination 02/11/2017   Umbilical hernia 02/09/2016   Advanced care planning/counseling discussion 01/31/2015   Medicare annual wellness visit, subsequent 01/29/2014   Retinal detachment    Family history of early CAD 02/19/2012   Hyperlipidemia LDL goal <70 04/27/2009    Past Surgical History:  Procedure Laterality Date   CARDIAC CATHETERIZATION     CATARACT EXTRACTION  2018   Bil   COLONOSCOPY  04/2018   TA, SSP, diverticulosis, rpt 3 yrs ([facility]); with polypectom y   CORONARY STENT INTERVENTION N/A 11/10/2018   Procedure: CORONARY STENT INTERVENTION;  Surgeon: Yvonne Kendall, MD;  Location: [facility] INVASIVE CV [facility];  Service: Cardiovascular;  Laterality: N/A;   LEFT HEART CATH AND CORONARY ANGIOGRAPHY N/A 11/10/2018   Procedure: LEFT HEART CATH AND CORONARY ANGIOGRAPHY;  Surgeon: Yvonne Kendall, MD;  Location: [facility] INVASIVE CV LAB;  Service: Cardiovascular;  Laterality: N/A;   Left inguinal repair  in High School   RETINAL DETACHMENT SURGERY Left    x 2 in 2014   UMBILICAL HERNIA REPAIR N/A 05/30/2018   Procedure: LAPAROSCOPIC ASSISTED  UMBILICAL HERNIA REPAIR WITH MESH,  ERAS PATHWAY;  Surgeon: Luretha Murphy, MD;  Location: [facility];  Service: General;  Laterality: N/A;       Home Medications    Prior to Admission medications   Medication Sig Start Date End Date Taking? Authorizing Provider  cyclobenzaprine (FLEXERIL)  10 MG tablet Take 1 tablet (10 mg total) by mouth 2 (two) times daily as needed for muscle spasms. 01/06/23  Yes Flemon Kelty R, NP  predniSONE (DELTASONE) 20 MG tablet Take 2 tablets (40 mg total) by mouth daily. 01/06/23  Yes Valinda Hoar, NP  aspirin EC 81 MG EC tablet Take 1 tablet (81 mg total) by mouth daily. 11/12/18   Enedina Finner, MD  Cyanocobalamin (B-12 PO) Take 500 mcg by mouth daily.     [provider]  ezetimibe (ZETIA) 10 MG tablet Take 1 tablet (10 mg  total) by mouth daily. 01/01/23   End, Cristal Deer, MD  famotidine (PEPCID) 20 MG tablet TAKE 1 TABLET (20 MG TOTAL) BY MOUTH 2 (TWO) TIMES DAILY AS NEEDED FOR HEARTBURN OR INDIGESTION. 12/31/22 03/31/23  End, Cristal Deer, MD  nitroGLYCERIN (NITROSTAT) 0.4 MG SL tablet Place 1 tablet (0.4 mg total) under the tongue every 5 (five) minutes x 3 doses as needed for chest pain. 03/07/21   Eustaquio Boyden, MD  rosuvastatin (CRESTOR) 40 MG tablet TAKE 1 TABLET BY MOUTH EVERY DAY 02/02/22   End, Cristal Deer, MD  valsartan (DIOVAN) 160 MG tablet TAKE 1 TABLET BY MOUTH EVERY DAY 10/08/22   End, Cristal Deer, MD  vitamin C (ASCORBIC ACID) 500 MG tablet Take 1 tablet (500 mg total) by mouth every Monday, Wednesday, and Friday. 03/08/21   Eustaquio Boyden, MD    Family History Family History  Problem Relation Age of Onset   Cancer Mother 27       metastatic cancer, smoker   Coronary artery disease Father 31       MI with CABG, smoker   Stroke Father        after catheterization   Alcohol abuse Father    Alcohol abuse Brother    Coronary artery disease Brother 5       CABG, smoker   Depression Brother    Drug abuse Brother    Hearing loss Daughter    Cancer Maternal Aunt        breast   Breast cancer Maternal Aunt    Diabetes Neg Hx    Colon cancer Neg Hx    Rectal cancer Neg Hx    Stomach cancer Neg Hx     Social History Social History   Tobacco Use   Smoking status: Never   Smokeless tobacco: Former    Types: Chew    Quit date: 05/21/1986  Vaping Use   Vaping status: Never Used  Substance Use Topics   Alcohol use: Not Currently    Alcohol/week: 1.0 standard drink of alcohol    Types: 1 Glasses of wine per week    Comment: rare   Drug use: No     Allergies   Lisinopril   Review of Systems Review of Systems  Constitutional: Negative.   Respiratory: Negative.    Cardiovascular: Negative.   Gastrointestinal: Negative.   Genitourinary: Negative.   Musculoskeletal:  Positive  for myalgias. Negative for arthralgias, back pain, gait problem, joint swelling, neck pain and neck stiffness.  Skin: Negative.   Neurological: Negative.      Physical Exam Triage Vital Signs ED Triage Vitals [01/06/23 1352]  Encounter Vitals Group     BP 124/79     Systolic BP Percentile      Diastolic BP Percentile      Pulse Rate 72     Resp 16     Temp 98.3 F (36.8 C)     Temp  Source Oral     SpO2 97 %     Weight      Height      Head Circumference      Peak Flow      Pain Score      Pain Loc      Pain Education      Exclude from Growth Chart    No data found.  Updated Vital Signs BP 124/79 (BP Location: Right Arm)   Pulse 72   Temp 98.3 F (36.8 C) (Oral)   Resp 16   SpO2 97%   Visual Acuity Right Eye Distance:   Left Eye Distance:   Bilateral Distance:    Right Eye Near:   Left Eye Near:    Bilateral Near:     Physical Exam Constitutional:      Appearance: Normal appearance.  Eyes:     Extraocular Movements: Extraocular movements intact.  Pulmonary:     Effort: Pulmonary effort is normal.  Musculoskeletal:     Comments: Tenderness is present to the left latissimus dorsi and along the left upper buttocks without ecchymosis swelling or deformity, positive left straight leg test, able to sit erect without complication, limitations on range of motion as pain is elicited, no tenderness along the hip joint, pain with completing range of motion, able to bear weight to the lower extremity  Neurological:     Mental Status: He is alert and oriented to person, place, and time. Mental status is at baseline.      UC Treatments / Results  Labs (all labs ordered are listed, but only abnormal results are displayed) Labs Reviewed - No data to display  EKG   Radiology No results found.  Procedures Procedures (including critical care time)  Medications Ordered in UC Medications  ketorolac (TORADOL) 30 MG/ML injection 30 mg (30 mg Intramuscular Given  01/06/23 1352)    Initial Impression / Assessment and Plan / UC Course  I have reviewed the triage vital signs and the nursing notes.  Pertinent labs & imaging results that were available during my care of the patient were reviewed by me and considered in my medical decision making (see chart for details).  Acute left-sided low back pain with left-sided sciatica  Due to lack of injury, will defer imaging etiology most likely muscular with pain elicited from the back, no tenderness noted from the hip joint on exam, discussed this with patient, Toradol injection given in office and prescribed prednisone and Flexeril for outpatient use, recommended RICE heat massage stretching and activity as tolerated, walking referral given to orthopedics if symptoms persist or worsen Final Clinical Impressions(s) / UC Diagnoses   Final diagnoses:  Acute left-sided low back pain with left-sided sciatica     Discharge Instructions      Your pain is most likely caused by irritation to the muscles which is compressing nerves causing pain to radiate down the leg  An injection of Toradol to help reduce inflammation, ideally should start to see improvement in 30 minutes to an hour  Starting tomorrow take prednisone every morning with food for 5 days to continue the above process, may take Tylenol 500 to 1000 mg every 6 hours as needed in addition to this  You may use muscle relaxants twice daily as needed for additional comfort, be mindful this will make you feel sleepy  You may use heating pad in 15 minute intervals as needed for additional comfort,or,  you may find comfort in using ice  in 10-15 minutes over affected area  Begin stretching affected area daily for 10 minutes as tolerated to further loosen muscles   When sitting and lying down place pillow underneath and between knees for support   Practice good posture: head back, shoulders back, chest forward, pelvis back and weight distributed evenly on  both legs  If pain persist after recommended treatment or reoccurs if may be beneficial to follow up with orthopedic specialist for evaluation, this doctor specializes in the bones and can manage your symptoms long-term with options such as but not limited to imaging, medications or physical therapy      ED Prescriptions     Medication Sig Dispense Auth. Provider   predniSONE (DELTASONE) 20 MG tablet Take 2 tablets (40 mg total) by mouth daily. 10 tablet Jessie Cowher, Hansel Starling R, NP   cyclobenzaprine (FLEXERIL) 10 MG tablet Take 1 tablet (10 mg total) by mouth 2 (two) times daily as needed for muscle spasms. 20 tablet Valinda Hoar, NP      PDMP not reviewed this encounter.   Valinda Hoar, NP 01/06/23 1357

## 2023-01-06 NOTE — ED Triage Notes (Signed)
Triaged by provider  

## 2023-01-07 ENCOUNTER — Ambulatory Visit: Payer: PPO

## 2023-01-16 ENCOUNTER — Ambulatory Visit: Payer: PPO | Attending: Internal Medicine | Admitting: Internal Medicine

## 2023-01-16 ENCOUNTER — Encounter: Payer: Self-pay | Admitting: Internal Medicine

## 2023-01-16 VITALS — BP 112/82 | HR 80 | Ht 71.0 in | Wt 208.2 lb

## 2023-01-16 DIAGNOSIS — I10 Essential (primary) hypertension: Secondary | ICD-10-CM | POA: Diagnosis not present

## 2023-01-16 DIAGNOSIS — E785 Hyperlipidemia, unspecified: Secondary | ICD-10-CM

## 2023-01-16 DIAGNOSIS — R0609 Other forms of dyspnea: Secondary | ICD-10-CM

## 2023-01-16 DIAGNOSIS — R202 Paresthesia of skin: Secondary | ICD-10-CM | POA: Diagnosis not present

## 2023-01-16 DIAGNOSIS — I2510 Atherosclerotic heart disease of native coronary artery without angina pectoris: Secondary | ICD-10-CM

## 2023-01-16 NOTE — Progress Notes (Unsigned)
Cardiology Office Note:  .   Date:  01/17/2023  ID:  Martin Garza, DOB 10/28/1950, MRN [id number] PCP: Eustaquio Boyden, MD  [facility] Providers Cardiologist:  Yvonne Kendall, MD     History of Present Illness: .   Martin Garza is a 72 y.o. male with history of coronary artery disease status post PCI to proximal LAD in the setting of NSTEMI (complicated by loss of flow in the distal LAD), hypertension, hyperlipidemia, and ischemic cardiomyopathy who presents for follow-up of coronary artery disease.  I last saw him in 11/2021, at which time he was feeling well other than continued indigestion responsive to famotidine.  We did not make any medication changes or pursue additional testing.  Today comes to Martin Garza reports that he has been feeling fairly well without any chest pain.  He recently developed severe low back/hip pain while [place] the beach and subsequently received a joint injection as well as a course of prednisone.  Pain resolved.  He is concerned about burning in his feet at night and wonders if any of his medications could be contributing.  He also notes long history of restless legs.  He tries to remain active at work and at home.  He denies chest pain, palpitations, lightheadedness, and edema.  He notes some continued exertional dyspnea ever since his MI in 01/22/2019, which is most pronounced when he bends over.  Overall, this is stable.  ROS: See HPI  Studies Reviewed: Marland Kitchen   EKG Interpretation Date/Time:  Wednesday January 16 2023 15:59:43 EDT Ventricular Rate:  80 PR Interval:  168 QRS Duration:  90 QT Interval:  368 QTC Calculation: 424 R Axis:   -19  Text Interpretation: Normal sinus rhythm Normal ECG When compared with ECG of 06-Dec-2021 QRS axis has changed Criteria for Septal infarct are no longer Present Confirmed by Aneesa Romey, Cristal Deer [phone]) on 01/17/2023 7:24:47 AM    Exercise MPI (11/04/2020): Low risk study with small fixed apical defect consistent with scar.   No ischemia observed.  LVEF 60%.  TTE (10/18/2020): Normal LV size with borderline LVH.  LVEF 55-60% without wall motion abnormalities.  Indeterminate diastolic function.  Mildly dilated RV with normal systolic function.  Normal biatrial size.  No pericardial effusion.  Trivial mitral and tricuspid regurgitation.  Risk Assessment/Calculations:             Physical Exam:   VS:  BP 112/82 (BP Location: Left Arm, Patient Position: Sitting, Cuff Size: Normal)   Pulse 80   Ht 5\' 11"  (1.803 m)   Wt 208 lb 3.2 oz (94.4 kg)   SpO2 98%   BMI 29.04 kg/m    Wt Readings from Last 3 Encounters:  01/16/23 208 lb 3.2 oz (94.4 kg)  09/05/22 206 lb 2 oz (93.5 kg)  07/10/22 204 lb (92.5 kg)    General:  NAD. Neck: No JVD or HJR. Lungs: Clear to auscultation bilaterally without wheezes or crackles. Heart: Regular rate and rhythm without murmurs, rubs, or gallops. Abdomen: Soft, nontender, nondistended. Extremities: No lower extremity edema.  ASSESSMENT AND PLAN: .    Coronary artery disease: Martin Garza is doing well from a heart standpoint without recurrent angina.  He notes some chronic exertional dyspnea, most pronounced when bending over, which has been stable for at least 4 years.  His EKG today is normal.  We have agreed to defer additional testing.  Continue current medications for secondary prevention.  Hypertension: Blood pressure reasonably well-controlled today.  Continue current dose  of valsartan.  Hyperlipidemia and paresthesias: Most recent lipid panel in 08/2022 notable for acceptable LDL (67) and mild hypertriglyceridemia (albeit in the setting of a nonfasting sample).  Given concern for paresthesias, I have recommended a 2-week statin holiday.  If discomfort in Martin Garza feet resolves, we will need to consider trial of an alternative statin versus a PCSK9 inhibitor.  If symptoms do not abate, Martin Garza will need to resume rosuvastatin 40 mg daily.  Continue ezetimibe for the time  being.  Dyspnea on exertion: This has been a longstanding issue dating back to the time of Martin Garza MI in 2020.  Echo in 2022 showed preserved LVEF with mild RV dilation but normal function.  I suspect Martin Garza has an element of diastolic dysfunction.  He appears euvolemic on exam today.  We have agreed to defer additional testing today, though I asked him to contact us if his symptoms progress.     Dispo: Return to clinic in 1 year.  Signed, Yvonne Kendall, MD

## 2023-01-16 NOTE — Patient Instructions (Signed)
Medication Instructions:   Hold Rosuvastatin for 2 weeks to see if symptoms resolve. Please contact our office to provide an update.   *If you need a refill on your cardiac medications before your next appointment, please call your pharmacy*   Lab Work: No labs ordered today    Testing/Procedures: No test ordered today    Follow-Up: At [facility], you and your health needs are our priority.  As part of our continuing mission to provide you with exceptional heart care, we have created designated Provider Care Teams.  These Care Teams include your primary Cardiologist (physician) and Advanced Practice Providers (APPs -  Physician Assistants and Nurse Practitioners) who all work together to provide you with the care you need, when you need it.  We recommend signing up for the patient portal called "MyChart".  Sign up information is provided on this After Visit Summary.  MyChart is used to connect with patients for Virtual Visits (Telemedicine).  Patients are able to view lab/test results, encounter notes, upcoming appointments, etc.  Non-urgent messages can be sent to your provider as well.   To learn more about what you can do with MyChart, go to [email or web address].    Your next appointment:   1 year(s)  Provider:   You may see Yvonne Kendall, MD or one of the following Advanced Practice Providers on your designated Care Team:   Nicolasa Ducking, NP Eula Listen, PA-C Cadence Fransico Michael, PA-C Charlsie Quest, NP

## 2023-01-17 ENCOUNTER — Encounter: Payer: Self-pay | Admitting: Internal Medicine

## 2023-01-17 DIAGNOSIS — R202 Paresthesia of skin: Secondary | ICD-10-CM | POA: Insufficient documentation

## 2023-01-23 DIAGNOSIS — M5416 Radiculopathy, lumbar region: Secondary | ICD-10-CM | POA: Diagnosis not present

## 2023-03-03 ENCOUNTER — Other Ambulatory Visit: Payer: Self-pay | Admitting: Internal Medicine

## 2023-03-05 NOTE — Telephone Encounter (Signed)
Please advise if ok to refill medication. Thank you so much.

## 2023-04-01 ENCOUNTER — Other Ambulatory Visit: Payer: Self-pay | Admitting: Internal Medicine

## 2023-04-01 DIAGNOSIS — I25118 Atherosclerotic heart disease of native coronary artery with other forms of angina pectoris: Secondary | ICD-10-CM

## 2023-04-01 DIAGNOSIS — E785 Hyperlipidemia, unspecified: Secondary | ICD-10-CM

## 2023-04-01 DIAGNOSIS — I10 Essential (primary) hypertension: Secondary | ICD-10-CM

## 2023-04-02 ENCOUNTER — Other Ambulatory Visit: Payer: Self-pay | Admitting: Internal Medicine

## 2023-04-02 DIAGNOSIS — E785 Hyperlipidemia, unspecified: Secondary | ICD-10-CM

## 2023-04-02 DIAGNOSIS — I25118 Atherosclerotic heart disease of native coronary artery with other forms of angina pectoris: Secondary | ICD-10-CM

## 2023-05-03 ENCOUNTER — Ambulatory Visit
Admission: RE | Admit: 2023-05-03 | Discharge: 2023-05-03 | Disposition: A | Discharge status: Home / Self Care | Payer: PPO | Source: Ambulatory Visit | Attending: Emergency Medicine

## 2023-05-03 VITALS — BP 104/73 | HR 92 | Temp 98.6°F | Resp 18

## 2023-05-03 DIAGNOSIS — M5442 Lumbago with sciatica, left side: Secondary | ICD-10-CM | POA: Diagnosis not present

## 2023-05-03 MED ORDER — PREDNISONE 10 MG PO TABS: 30.0000 mg | ORAL_TABLET | Freq: Every day | ORAL | 0 refills | Status: AC

## 2023-05-03 MED ORDER — DEXAMETHASONE SODIUM PHOSPHATE 10 MG/ML IJ SOLN
10.0000 mg | Freq: Once | INTRAMUSCULAR | Status: AC
  Administered 2023-05-03: 10 mg via INTRAMUSCULAR

## 2023-05-03 NOTE — ED Triage Notes (Addendum)
Patient to Urgent Care with complaints of lower back pain with radiation into his left leg. Hx of sciatica. Denies any known injury.   Symptoms started three days ago. Pain worse w/ standing and with movement. Able to get some relief when sitting.  Meds: no otc meds today. Advil last night.

## 2023-05-03 NOTE — Discharge Instructions (Addendum)
You were given an injection of dexamethasone.  Start the prednisone tomorrow.  Follow-up with your primary care provider or orthopedist if you are not improving.

## 2023-05-03 NOTE — ED Provider Notes (Signed)
Renaldo Fiddler    CSN: [id number] Arrival date & time: 05/03/23  1623      History   Chief Complaint Chief Complaint  Patient presents with   Back Pain    Entered by patient    HPI Martin Garza is a 72 y.o. male.  Patient presents with 3-day history of left lower back pain with radiation to his left leg.  No injury.  His pain is worse with standing and walking, improves with rest.  He took ibuprofen last night but no OTC medications today.  No numbness, weakness, saddle anesthesia, loss of bowel/bladder control, dysuria, hematuria, or other symptoms.  Patient was seen at this urgent care on 01/06/2023; diagnosed with acute left-sided low back pain with left-sided sciatica; treated with cyclobenzaprine and prednisone.  He was seen at [facility] on 01/23/2023; diagnosed with left-sided radiculopathy; treated with gabapentin, NSAID, muscle relaxer, therapy.  Patient's medical history includes hypertension, ischemic cardiomyopathy, hyperlipidemia, CAD.  The history is provided by the patient and medical records.    Past Medical History:  Diagnosis Date   Allergy    CAD (coronary artery disease)    a. 10/2018 NSTEMI/PCI: LM nl, LAD 95p (3.0x12 Resolute Onyx DES), 34m, 85d (2.0x18 Resolute Onyx DES), LCX nl, RCA large, 20ost, 30d.   Cataract    had surg in both eyes   Complication of anesthesia    Blood pressure goes up after waking up past sedation/ gets dizzy too per pt!   Essential hypertension    Hyperlipidemia LDL goal <70    Ischemic cardiomyopathy    a. 10/2018 Echo: EF 55-60%, sev apical septal, apical, and ant HK. DD. Nl RV fxn.   Medical history non-contributory    Retinal detachment 2014   left   Umbilical hernia     Patient Active Problem List   Diagnosis Date Noted   Paresthesia of both feet 01/17/2023   Left foot pain 09/05/2022   Dyspepsia 05/31/2021   Prostate irregularity 10/26/2020   Atherosclerosis of aorta (HCC) 10/26/2020   Diverticulosis  10/26/2020   Hepatic steatosis 10/26/2020   Spinal stenosis at L4-L5 level 10/26/2020   Dyspnea on exertion 09/16/2020   Fatigue 09/16/2020   Coronary artery disease involving native coronary artery of native heart without angina pectoris 12/31/2018   Ischemic cardiomyopathy 12/31/2018   Essential hypertension 12/31/2018   Health maintenance examination 02/11/2017   Umbilical hernia 02/09/2016   Advanced care planning/counseling discussion 01/31/2015   Medicare annual wellness visit, subsequent 01/29/2014   Retinal detachment    Family history of early CAD 02/19/2012   Hyperlipidemia LDL goal <70 04/27/2009    Past Surgical History:  Procedure Laterality Date   CARDIAC CATHETERIZATION     CATARACT EXTRACTION  2018   Bil   COLONOSCOPY  04/2018   TA, SSP, diverticulosis, rpt 3 yrs ([facility]); with polypectom y   CORONARY STENT INTERVENTION N/A 11/10/2018   Procedure: CORONARY STENT INTERVENTION;  Surgeon: Yvonne Kendall, MD;  Location: [facility] INVASIVE CV LAB;  Service: Cardiovascular;  Laterality: N/A;   LEFT HEART CATH AND CORONARY ANGIOGRAPHY N/A 11/10/2018   Procedure: LEFT HEART CATH AND CORONARY ANGIOGRAPHY;  Surgeon: Yvonne Kendall, MD;  Location: [facility] INVASIVE CV LAB;  Service: Cardiovascular;  Laterality: N/A;   Left inguinal repair  in High School   RETINAL DETACHMENT SURGERY Left    x 2 in 2014   UMBILICAL HERNIA REPAIR N/A 05/30/2018   Procedure: LAPAROSCOPIC ASSISTED  UMBILICAL HERNIA REPAIR WITH MESH,  ERAS PATHWAY;  Surgeon: Luretha Murphy, MD;  Location: [facility] ORS;  Service: General;  Laterality: N/A;       Home Medications    Prior to Admission medications   Medication Sig Start Date End Date Taking? Authorizing Provider  predniSONE (DELTASONE) 10 MG tablet Take 3 tablets (30 mg total) by mouth daily for 3 days. 05/04/23 05/07/23 Yes Mickie Bail, NP  ascorbic Acid (VITAMIN C) 500 MG CPCR Take 500 mg by mouth daily.    [provider]  aspirin EC 81 MG EC  tablet Take 1 tablet (81 mg total) by mouth daily. 11/12/18   Enedina Finner, MD  Cyanocobalamin (B-12 PO) Take 500 mcg by mouth daily.     [provider]  cyclobenzaprine (FLEXERIL) 10 MG tablet Take 1 tablet (10 mg total) by mouth 2 (two) times daily as needed for muscle spasms. 01/06/23   White, Elita Boone, NP  ezetimibe (ZETIA) 10 MG tablet TAKE 1 TABLET BY MOUTH EVERY DAY 04/02/23   End, Cristal Deer, MD  famotidine (PEPCID) 20 MG tablet TAKE 1 TABLET (20 MG TOTAL) BY MOUTH 2 (TWO) TIMES DAILY AS NEEDED FOR HEARTBURN OR INDIGESTION. 03/06/23 06/04/23  End, Cristal Deer, MD  nitroGLYCERIN (NITROSTAT) 0.4 MG SL tablet Place 1 tablet (0.4 mg total) under the tongue every 5 (five) minutes x 3 doses as needed for chest pain. 03/07/21   Eustaquio Boyden, MD  rosuvastatin (CRESTOR) 40 MG tablet TAKE 1 TABLET BY MOUTH EVERY DAY 04/01/23   End, Cristal Deer, MD  valsartan (DIOVAN) 160 MG tablet TAKE 1 TABLET BY MOUTH EVERY DAY 04/01/23   End, Cristal Deer, MD    Family History Family History  Problem Relation Age of Onset   Cancer Mother 52       metastatic cancer, smoker   Coronary artery disease Father 66       MI with CABG, smoker   Stroke Father        after catheterization   Alcohol abuse Father    Alcohol abuse Brother    Coronary artery disease Brother 63       CABG, smoker   Depression Brother    Drug abuse Brother    Hearing loss Daughter    Cancer Maternal Aunt        breast   Breast cancer Maternal Aunt    Diabetes Neg Hx    Colon cancer Neg Hx    Rectal cancer Neg Hx    Stomach cancer Neg Hx     Social History Social History   Tobacco Use   Smoking status: Never   Smokeless tobacco: Former    Types: Chew    Quit date: 05/21/1986  Vaping Use   Vaping status: Never Used  Substance Use Topics   Alcohol use: Not Currently    Alcohol/week: 1.0 standard drink of alcohol    Types: 1 Glasses of wine per week    Comment: rare   Drug use: No     Allergies    Lisinopril   Review of Systems Review of Systems  Constitutional:  Negative for chills and fever.  Genitourinary:  Negative for dysuria and hematuria.  Musculoskeletal:  Positive for back pain. Negative for gait problem.  Skin:  Negative for color change, rash and wound.  Neurological:  Negative for weakness and numbness.     Physical Exam Triage Vital Signs ED Triage Vitals  Encounter Vitals Group     BP 05/03/23 1646 104/73     Systolic BP Percentile --  Diastolic BP Percentile --      Pulse Rate 05/03/23 1643 92     Resp 05/03/23 1643 18     Temp 05/03/23 1643 98.6 F (37 C)     Temp src --      SpO2 05/03/23 1643 95 %     Weight --      Height --      Head Circumference --      Peak Flow --      Pain Score 05/03/23 1643 8     Pain Loc --      Pain Education --      Exclude from Growth Chart --    No data found.  Updated Vital Signs BP 104/73   Pulse 92   Temp 98.6 F (37 C)   Resp 18   SpO2 95%   Visual Acuity Right Eye Distance:   Left Eye Distance:   Bilateral Distance:    Right Eye Near:   Left Eye Near:    Bilateral Near:     Physical Exam Constitutional:      General: He is not in acute distress. HENT:     Mouth/Throat:     Mouth: Mucous membranes are moist.  Cardiovascular:     Rate and Rhythm: Normal rate and regular rhythm.  Pulmonary:     Effort: Pulmonary effort is normal. No respiratory distress.  Musculoskeletal:        General: No swelling, tenderness or deformity. Normal range of motion.  Skin:    General: Skin is warm and dry.     Capillary Refill: Capillary refill takes less than 2 seconds.     Findings: No bruising, erythema, lesion or rash.  Neurological:     General: No focal deficit present.     Mental Status: He is alert and oriented to person, place, and time.     Sensory: No sensory deficit.     Motor: No weakness.     Gait: Gait normal.      UC Treatments / Results  Labs (all labs ordered are listed,  but only abnormal results are displayed) Labs Reviewed - No data to display  EKG   Radiology No results found.  Procedures Procedures (including critical care time)  Medications Ordered in UC Medications  dexamethasone (DECADRON) injection 10 mg (10 mg Intramuscular Given 05/03/23 1708)    Initial Impression / Assessment and Plan / UC Course  I have reviewed the triage vital signs and the nursing notes.  Pertinent labs & imaging results that were available during my care of the patient were reviewed by me and considered in my medical decision making (see chart for details).    Acute left lower back pain with left-sided sciatica.  Afebrile and vital signs are stable.  Injection of dexamethasone given here.  Starting 3-day course of prednisone tomorrow.  Instructed patient to follow-up with his PCP or an orthopedist if he is not improving.  Education provided on sciatica.  ED precautions given.  Patient agrees to plan of care.  Final Clinical Impressions(s) / UC Diagnoses   Final diagnoses:  Acute left-sided low back pain with left-sided sciatica     Discharge Instructions      You were given an injection of dexamethasone.  Start the prednisone tomorrow.  Follow-up with your primary care provider or orthopedist if you are not improving.     ED Prescriptions     Medication Sig Dispense Auth. Provider   predniSONE (DELTASONE) 10 MG  tablet Take 3 tablets (30 mg total) by mouth daily for 3 days. 9 tablet Mickie Bail, NP      PDMP not reviewed this encounter.   Mickie Bail, NP 05/03/23 [phone]

## 2023-08-24 ENCOUNTER — Other Ambulatory Visit: Payer: Self-pay | Admitting: Family Medicine

## 2023-08-24 DIAGNOSIS — N429 Disorder of prostate, unspecified: Secondary | ICD-10-CM

## 2023-08-24 DIAGNOSIS — E785 Hyperlipidemia, unspecified: Secondary | ICD-10-CM

## 2023-08-30 ENCOUNTER — Other Ambulatory Visit (INDEPENDENT_AMBULATORY_CARE_PROVIDER_SITE_OTHER): Payer: PPO

## 2023-08-30 DIAGNOSIS — N429 Disorder of prostate, unspecified: Secondary | ICD-10-CM

## 2023-08-30 DIAGNOSIS — E785 Hyperlipidemia, unspecified: Secondary | ICD-10-CM

## 2023-08-30 LAB — COMPREHENSIVE METABOLIC PANEL WITH GFR
ALT: 29 U/L (ref 0–53)
AST: 27 U/L (ref 0–37)
Albumin: 4.4 g/dL (ref 3.5–5.2)
Alkaline Phosphatase: 41 U/L (ref 39–117)
BUN: 19 mg/dL (ref 6–23)
CO2: 28 meq/L (ref 19–32)
Calcium: 8.9 mg/dL (ref 8.4–10.5)
Chloride: 104 meq/L (ref 96–112)
Creatinine, Ser: 1.13 mg/dL (ref 0.40–1.50)
GFR: 63.96 mL/min (ref >60.00)
Glucose, Bld: 113 mg/dL — ABNORMAL HIGH (ref 70–99)
Potassium: 4.5 meq/L (ref 3.5–5.1)
Sodium: 138 meq/L (ref 135–145)
Total Bilirubin: 0.7 mg/dL (ref 0.2–1.2)
Total Protein: 6.4 g/dL (ref 6.0–8.3)

## 2023-08-30 LAB — LIPID PANEL
Cholesterol: 124 mg/dL (ref 0–200)
HDL: 49.4 mg/dL (ref >39.00)
LDL Cholesterol: 52 mg/dL (ref 0–99)
NonHDL: 74.41
Total CHOL/HDL Ratio: 3
Triglycerides: 114 mg/dL (ref 0.0–149.0)
VLDL: 22.8 mg/dL (ref 0.0–40.0)

## 2023-08-30 LAB — PSA: PSA: 1.28 ng/mL (ref 0.10–4.00)

## 2023-09-06 ENCOUNTER — Encounter: Payer: PPO | Admitting: Family Medicine

## 2023-09-09 ENCOUNTER — Encounter: Payer: Self-pay | Admitting: Family Medicine

## 2023-09-09 ENCOUNTER — Ambulatory Visit (INDEPENDENT_AMBULATORY_CARE_PROVIDER_SITE_OTHER): Payer: PPO | Admitting: Family Medicine

## 2023-09-09 VITALS — BP 130/74 | HR 65 | Temp 98.2°F | Ht 68.75 in | Wt 203.4 lb

## 2023-09-09 DIAGNOSIS — R202 Paresthesia of skin: Secondary | ICD-10-CM

## 2023-09-09 DIAGNOSIS — R351 Nocturia: Secondary | ICD-10-CM

## 2023-09-09 DIAGNOSIS — N401 Enlarged prostate with lower urinary tract symptoms: Secondary | ICD-10-CM | POA: Diagnosis not present

## 2023-09-09 DIAGNOSIS — I10 Essential (primary) hypertension: Secondary | ICD-10-CM | POA: Diagnosis not present

## 2023-09-09 DIAGNOSIS — K219 Gastro-esophageal reflux disease without esophagitis: Secondary | ICD-10-CM

## 2023-09-09 DIAGNOSIS — Z0000 Encounter for general adult medical examination without abnormal findings: Secondary | ICD-10-CM

## 2023-09-09 DIAGNOSIS — D72821 Monocytosis (symptomatic): Secondary | ICD-10-CM

## 2023-09-09 DIAGNOSIS — K760 Fatty (change of) liver, not elsewhere classified: Secondary | ICD-10-CM | POA: Diagnosis not present

## 2023-09-09 DIAGNOSIS — E785 Hyperlipidemia, unspecified: Secondary | ICD-10-CM

## 2023-09-09 DIAGNOSIS — N429 Disorder of prostate, unspecified: Secondary | ICD-10-CM

## 2023-09-09 DIAGNOSIS — R5382 Chronic fatigue, unspecified: Secondary | ICD-10-CM | POA: Diagnosis not present

## 2023-09-09 DIAGNOSIS — Z7189 Other specified counseling: Secondary | ICD-10-CM

## 2023-09-09 DIAGNOSIS — I700 Atherosclerosis of aorta: Secondary | ICD-10-CM

## 2023-09-09 DIAGNOSIS — I2510 Atherosclerotic heart disease of native coronary artery without angina pectoris: Secondary | ICD-10-CM

## 2023-09-09 DIAGNOSIS — Z860101 Personal history of adenomatous and serrated colon polyps: Secondary | ICD-10-CM

## 2023-09-09 MED ORDER — VITAMIN B-12 1000 MCG PO TABS: 1000.0000 ug | ORAL_TABLET | Freq: Every day | ORAL | Status: AC

## 2023-09-09 NOTE — Progress Notes (Signed)
 Ph: [phone] Fax: [phone]   Patient ID: Martin Garza, male    DOB: Jul 28, 1950, 73 y.o.   MRN: [id number]  This visit was conducted in person.  BP 130/74   Pulse 65   Temp 98.2 F (36.8 C) (Oral)   Ht 5' 8.75" (1.746 m)   Wt 203 lb 6 oz (92.3 kg)   SpO2 94%   BMI 30.25 kg/m    CC: AMW/CPE Subjective:   HPI: Martin Garza is a 73 y.o. male presenting on 09/09/2023 for Medicare Wellness   Did not see health advisor   Hearing Screening   500Hz  1000Hz  2000Hz  4000Hz   Right ear 40 0 20 0  Left ear 40 40 20 0  Vision Screening - Comments:: Last eye exam, 10/2022.  Flowsheet Row Clinical Support from 07/10/2022 in [facility]  PHQ-2 Total Score 0          07/10/2022    3:16 PM 03/07/2021   10:54 AM 02/13/2018   10:08 AM 02/08/2017    8:24 AM 02/01/2016    8:40 AM  Fall Risk   Falls in the past year? 0 0 No No No  Number falls in past yr: 0      Injury with Fall? 0      Risk for fall due to : No Fall Risks      Follow up Falls prevention discussed;Falls evaluation completed       Continues working full time.    Notes chronic fatigue present for the past 6-8 months. This is despite taking b12 orally regularly.  Sleeps 8 hours/night, broken by nocturia.  + snoring, no witnessed apnea or PNDyspnea. He does not feel rested. + daytime somnolence. No morning headache. Notes wife mentions he has restless sleep.   Bilateral foot burning at night. No paresthesias numbness or weakness of feet. Consider gabapentin.   CAD - on aspirin , crestor , zetia  followed by cardiology. H/o NSTEMI 2020 s/p PCI to prox LAD  Continues nexium 20mg  daily OTC with benefit PRN GERD.   Preventative: COLONOSCOPY 04/2018 - TA, SSP, diverticulosis, rpt 3 yrs ([facility]). Last visit didn't establish with [facility].  Prostate cancer screening - abnormal imaging study with normal DRE and PSA - rec yearly screen to ensure no changes. Denies prostate symptoms.  Nocturia x2-4. CT scan 2022 with irregularity to superior prostate however not enlarged.  Lung cancer screening - not eligible  Flu shot - declines  COVID vaccine - Pfizer 06/2019, 07/2019, booster 05/2020   Prevnar-13 92016, pneumovax 01/2016 Tdap 2005, 08/2011  Shingrix - discussed, declines  Advanced directives - would want wife Blaise Bumps to be HCPOA. Has this set up at home - asked to bring us  copy.  Seat belt use discussed Sunscreen use discussed, no suspicious moles Non smoker  Alcohol - seldom  Dentist Q6 mo Eye exam regularly  Bowel - no constipation  Bladder - no incontinence    Caffeine: 3 cups coffee/day, some unsweet tea  Lives with wife, 1 dog. one grown daughter  Occupation: Geophysical data processor  Activity: no regular exercise - walks 3 mi/day at work  Diet: some water, fruits/vegetables daily, avoids fried foods and simple carbs       Relevant past medical, surgical, family and social history reviewed and updated as indicated. Interim medical history since our last visit reviewed. Allergies and medications reviewed and updated. Outpatient Medications Prior to Visit  Medication Sig Dispense Refill   ascorbic Acid  (VITAMIN C )  500 MG CPCR Take 500 mg by mouth daily.     aspirin  EC 81 MG EC tablet Take 1 tablet (81 mg total) by mouth daily. 30 tablet 1   esomeprazole (NEXIUM) 20 MG capsule Take 20 mg by mouth daily as needed. OTC     ezetimibe  (ZETIA ) 10 MG tablet TAKE 1 TABLET BY MOUTH EVERY DAY 90 tablet 3   nitroGLYCERIN  (NITROSTAT ) 0.4 MG SL tablet Place 1 tablet (0.4 mg total) under the tongue every 5 (five) minutes x 3 doses as needed for chest pain. 20 tablet 2   rosuvastatin  (CRESTOR ) 40 MG tablet TAKE 1 TABLET BY MOUTH EVERY DAY 90 tablet 3   valsartan  (DIOVAN ) 160 MG tablet TAKE 1 TABLET BY MOUTH EVERY DAY 90 tablet 3   Cyanocobalamin  (B-12 PO) Take 500 mcg by mouth daily.      cyclobenzaprine  (FLEXERIL ) 10 MG tablet Take 1 tablet (10 mg total) by mouth 2 (two) times  daily as needed for muscle spasms. 20 tablet 0   famotidine  (PEPCID ) 20 MG tablet TAKE 1 TABLET (20 MG TOTAL) BY MOUTH 2 (TWO) TIMES DAILY AS NEEDED FOR HEARTBURN OR INDIGESTION. 60 tablet 0   No facility-administered medications prior to visit.     Per HPI unless specifically indicated in ROS section below Review of Systems  Constitutional:  Negative for activity change, appetite change, chills, fatigue, fever and unexpected weight change.  HENT:  Negative for hearing loss.   Eyes:  Negative for visual disturbance.  Respiratory:  Negative for cough, chest tightness, shortness of breath and wheezing.   Cardiovascular:  Negative for chest pain, palpitations and leg swelling.  Gastrointestinal:  Negative for abdominal distention, abdominal pain, blood in stool, constipation, diarrhea, nausea and vomiting.  Genitourinary:  Negative for difficulty urinating and hematuria.  Musculoskeletal:  Negative for arthralgias, myalgias and neck pain.  Skin:  Negative for rash.  Neurological:  Negative for dizziness, seizures, syncope and headaches.  Hematological:  Negative for adenopathy. Does not bruise/bleed easily.  Psychiatric/Behavioral:  Negative for dysphoric mood. The patient is not nervous/anxious.     Objective:  BP 130/74   Pulse 65   Temp 98.2 F (36.8 C) (Oral)   Ht 5' 8.75" (1.746 m)   Wt 203 lb 6 oz (92.3 kg)   SpO2 94%   BMI 30.25 kg/m   Wt Readings from Last 3 Encounters:  09/09/23 203 lb 6 oz (92.3 kg)  01/16/23 208 lb 3.2 oz (94.4 kg)  09/05/22 206 lb 2 oz (93.5 kg)      Physical Exam Vitals and nursing note reviewed.  Constitutional:      General: He is not in acute distress.    Appearance: Normal appearance. He is well-developed. He is not ill-appearing.  HENT:     Head: Normocephalic and atraumatic.     Right Ear: Hearing, tympanic membrane, ear canal and external ear normal.     Left Ear: Hearing, tympanic membrane, ear canal and external ear normal.      Mouth/Throat:     Mouth: Mucous membranes are moist.     Pharynx: Oropharynx is clear. No oropharyngeal exudate or posterior oropharyngeal erythema.  Eyes:     General: No scleral icterus.    Extraocular Movements: Extraocular movements intact.     Conjunctiva/sclera: Conjunctivae normal.     Pupils: Pupils are equal, round, and reactive to light.  Neck:     Thyroid : No thyroid  mass or thyromegaly.     Vascular: No carotid bruit.  Cardiovascular:     Rate and Rhythm: Normal rate and regular rhythm.     Pulses: Normal pulses.          Radial pulses are 2+ on the right side and 2+ on the left side.     Heart sounds: Normal heart sounds. No murmur heard. Pulmonary:     Effort: Pulmonary effort is normal. No respiratory distress.     Breath sounds: Normal breath sounds. No wheezing, rhonchi or rales.  Abdominal:     General: Bowel sounds are normal. There is no distension.     Palpations: Abdomen is soft. There is no mass.     Tenderness: There is no abdominal tenderness. There is no guarding or rebound.     Hernia: No hernia is present.  Genitourinary:    Prostate: Enlarged (mild-mod). Not tender and no nodules present.     Rectum: Normal. No mass, tenderness, anal fissure, external hemorrhoid or internal hemorrhoid. Normal anal tone.  Musculoskeletal:        General: Normal range of motion.     Cervical back: Normal range of motion and neck supple.     Right lower leg: No edema.     Left lower leg: No edema.  Lymphadenopathy:     Cervical: No cervical adenopathy.  Skin:    General: Skin is warm and dry.     Findings: No rash.  Neurological:     General: No focal deficit present.     Mental Status: He is alert and oriented to person, place, and time.     Comments:  Recall 3/3 Calculation 5/5 DLROW  Psychiatric:        Mood and Affect: Mood normal.        Behavior: Behavior normal.        Thought Content: Thought content normal.        Judgment: Judgment normal.        Results for orders placed or performed in visit on 09/09/23  TSH   Collection Time: 09/09/23  4:35 PM  Result Value Ref Range   TSH 1.26 0.35 - 5.50 uIU/mL  CBC with Differential/Platelet   Collection Time: 09/09/23  4:35 PM  Result Value Ref Range   WBC 5.9 4.0 - 10.5 K/uL   RBC 4.19 (L) 4.22 - 5.81 Mil/uL   Hemoglobin 13.7 13.0 - 17.0 g/dL   HCT 91.4 78.2 - 95.6 %   MCV 97.1 78.0 - 100.0 fl   MCHC 33.8 30.0 - 36.0 g/dL   RDW 21.3 08.6 - 57.8 %   Platelets 202.0 150.0 - 400.0 K/uL   Neutrophils Relative % 38.4 (L) 43.0 - 77.0 %   Lymphocytes Relative 33.4 12.0 - 46.0 %   Monocytes Relative 16.3 (H) 3.0 - 12.0 %   Eosinophils Relative 10.9 (H) 0.0 - 5.0 %   Basophils Relative 1.0 0.0 - 3.0 %   Neutro Abs 2.3 1.4 - 7.7 K/uL   Lymphs Abs 2.0 0.7 - 4.0 K/uL   Monocytes Absolute 1.0 0.1 - 1.0 K/uL   Eosinophils Absolute 0.6 0.0 - 0.7 K/uL   Basophils Absolute 0.1 0.0 - 0.1 K/uL  Vitamin B12   Collection Time: 09/09/23  4:35 PM  Result Value Ref Range   Vitamin B-12 540 211 - 911 pg/mL  VITAMIN D  25 Hydroxy (Vit-D Deficiency, Fractures)   Collection Time: 09/09/23  4:35 PM  Result Value Ref Range   VITD 34.74 30.00 - 100.00 ng/mL   Lab Results  Component  Value Date   CHOL 124 08/30/2023   HDL 49.40 08/30/2023   LDLCALC 52 08/30/2023   LDLDIRECT 67.0 09/05/2022   TRIG 114.0 08/30/2023   CHOLHDL 3 08/30/2023    Lab Results  Component Value Date   NA 138 08/30/2023   CL 104 08/30/2023   K 4.5 08/30/2023   CO2 28 08/30/2023   BUN 19 08/30/2023   CREATININE 1.13 08/30/2023   GFR 63.96 08/30/2023   CALCIUM  8.9 08/30/2023   ALBUMIN 4.4 08/30/2023   GLUCOSE 113 (H) 08/30/2023    Lab Results  Component Value Date   ALT 29 08/30/2023   AST 27 08/30/2023   ALKPHOS 41 08/30/2023   BILITOT 0.7 08/30/2023    Lab Results  Component Value Date   PSA 1.28 08/30/2023   PSA 1.09 09/05/2022   PSA 1.15 03/07/2021   Assessment & Plan:   Problem List Items Addressed This  Visit     Medicare annual wellness visit, subsequent - Primary (Chronic)   I have personally reviewed the Medicare Annual Wellness questionnaire and have noted 1. The patient's medical and social history 2. Their use of alcohol, tobacco or illicit drugs 3. Their current medications and supplements 4. The patient's functional ability including ADL's, fall risks, home safety risks and hearing or visual impairment. Cognitive function has been assessed and addressed as indicated.  5. Diet and physical activity 6. Evidence for depression or mood disorders The patients weight, height, BMI have been recorded in the chart. I have made referrals, counseling and provided education to the patient based on review of the above and I have provided the pt with a written personalized care plan for preventive services. Provider list updated.. See scanned questionairre as needed for further documentation. Reviewed preventative protocols and updated unless pt declined.       Advanced care planning/counseling discussion (Chronic)   Advanced directives - would want wife Blaise Bumps to be HCPOA. Has this set up at home - asked to bring us  copy.       Health maintenance examination (Chronic)   Preventative protocols reviewed and updated unless pt declined. Discussed healthy diet and lifestyle.       Hyperlipidemia LDL goal <70   Chronic, stable on statin, zetia  - continue The ASCVD Risk score (Arnett DK, et al., 2019) failed to calculate for the following reasons:   Risk score cannot be calculated because patient has a medical history suggesting prior/existing ASCVD       BPH (benign prostatic hyperplasia)   Reassuring digital rectal exam and PSA. Notes ongoing nocturia - discussed trial flomax - he will let me know if desired.       Coronary artery disease involving native coronary artery of native heart without angina pectoris   Continue aspirin , zetia , statin. Appreciate cardiology care.        Essential hypertension   Chronic, stable. Continue valsartan .      Fatigue   Update fatigue labs today  ESS = 10. If labs unrevealing, consider HST for OSA evaluation given endorsed daytime somnolence, snoring, restless non-restorative sleep.       Relevant Orders   TSH (Completed)   CBC with Differential/Platelet (Completed)   Vitamin B12 (Completed)   VITAMIN D  25 Hydroxy (Vit-D Deficiency, Fractures) (Completed)   Prostate irregularity   Reassuring DRE/PSA      Atherosclerosis of aorta (HCC)   Continue zetia , statin, aspirin       Hepatic steatosis   LFTs remain normal.  GERD (gastroesophageal reflux disease)   Chronic, stable with nexium 20mg  daily OTC      Relevant Medications   esomeprazole (NEXIUM) 20 MG capsule   Paresthesia of both feet   Ongoing - check labs (b12 ,thryoid).       Monocytosis   Chronic, present since 2022, of unclear etiology.  No other constitutional symptoms (weight loss, night sweats, lymphadenopathy) With new fatigue, consider heme eval.       Personal history of adenomatous and serrated colon polyps   Discussed overdue for colon cancer screening in h/o TA and SSP 2019 - he declines. He will let me know if he changes his mind.         Meds ordered this encounter  Medications   cyanocobalamin  (VITAMIN B12) 1000 MCG tablet    Sig: Take 1 tablet (1,000 mcg total) by mouth daily.    Orders Placed This Encounter  Procedures   TSH   CBC with Differential/Platelet   Vitamin B12   VITAMIN D  25 Hydroxy (Vit-D Deficiency, Fractures)    Patient Instructions  You are overdue for colonoscopy (in history of colon polyps) as last colonoscopy was 2019, recommended repeat 3 years at that time. Let me know if agree for new referral to establish locally, or desire to return to [place].  Consider flomax prostate medicine may decrease night time awakenings Sleep questionairre today  Bring us  a copy of your living will.  Good to see you  today Return as needed or in 1 year for next physical/wellness visit   Follow up plan: Return in about 1 year (around 09/08/2024) for annual exam, prior fasting for blood work, medicare wellness visit.  Claire Crick, MD

## 2023-09-09 NOTE — Patient Instructions (Addendum)
 You are overdue for colonoscopy (in history of colon polyps) as last colonoscopy was 2019, recommended repeat 3 years at that time. Let me know if agree for new referral to establish locally, or desire to return to [place].  Consider flomax prostate medicine may decrease night time awakenings Sleep questionairre today  Bring us  a copy of your living will.  Good to see you today Return as needed or in 1 year for next physical/wellness visit

## 2023-09-10 LAB — CBC WITH DIFFERENTIAL/PLATELET
Basophils Absolute: 0.1 10*3/uL (ref 0.0–0.1)
Basophils Relative: 1 % (ref 0.0–3.0)
Eosinophils Absolute: 0.6 10*3/uL (ref 0.0–0.7)
Eosinophils Relative: 10.9 % — ABNORMAL HIGH (ref 0.0–5.0)
HCT: 40.7 % (ref 39.0–52.0)
Hemoglobin: 13.7 g/dL (ref 13.0–17.0)
Lymphocytes Relative: 33.4 % (ref 12.0–46.0)
Lymphs Abs: 2 10*3/uL (ref 0.7–4.0)
MCHC: 33.8 g/dL (ref 30.0–36.0)
MCV: 97.1 fl (ref 78.0–100.0)
Monocytes Absolute: 1 10*3/uL (ref 0.1–1.0)
Monocytes Relative: 16.3 % — ABNORMAL HIGH (ref 3.0–12.0)
Neutro Abs: 2.3 10*3/uL (ref 1.4–7.7)
Neutrophils Relative %: 38.4 % — ABNORMAL LOW (ref 43.0–77.0)
Platelets: 202 10*3/uL (ref 150.0–400.0)
RBC: 4.19 Mil/uL — ABNORMAL LOW (ref 4.22–5.81)
RDW: 12.9 % (ref 11.5–15.5)
WBC: 5.9 10*3/uL (ref 4.0–10.5)

## 2023-09-10 LAB — VITAMIN D 25 HYDROXY (VIT D DEFICIENCY, FRACTURES): VITD: 34.74 ng/mL (ref 30.00–100.00)

## 2023-09-10 LAB — TSH: TSH: 1.26 u[IU]/mL (ref 0.35–5.50)

## 2023-09-10 LAB — VITAMIN B12: Vitamin B-12: 540 pg/mL (ref 211–911)

## 2023-09-11 ENCOUNTER — Encounter: Payer: Self-pay | Admitting: Family Medicine

## 2023-09-11 DIAGNOSIS — Z860101 Personal history of adenomatous and serrated colon polyps: Secondary | ICD-10-CM | POA: Insufficient documentation

## 2023-09-11 DIAGNOSIS — D72821 Monocytosis (symptomatic): Secondary | ICD-10-CM | POA: Insufficient documentation

## 2023-09-11 NOTE — Assessment & Plan Note (Signed)
 Preventative protocols reviewed and updated unless pt declined. Discussed healthy diet and lifestyle.

## 2023-09-11 NOTE — Assessment & Plan Note (Signed)
 Reassuring DRE/PSA

## 2023-09-11 NOTE — Assessment & Plan Note (Addendum)
 Chronic, present since 2022, of unclear etiology.  No other constitutional symptoms (weight loss, night sweats, lymphadenopathy) With new fatigue, consider heme eval.

## 2023-09-11 NOTE — Assessment & Plan Note (Signed)
 Chronic, stable on statin, zetia  - continue The ASCVD Risk score (Arnett DK, et al., 2019) failed to calculate for the following reasons:   Risk score cannot be calculated because patient has a medical history suggesting prior/existing ASCVD

## 2023-09-11 NOTE — Assessment & Plan Note (Signed)
LFTs remain normal.

## 2023-09-11 NOTE — Assessment & Plan Note (Signed)
 Ongoing - check labs (b12 ,thryoid).

## 2023-09-11 NOTE — Assessment & Plan Note (Signed)
 Continue zetia , statin, aspirin 

## 2023-09-11 NOTE — Assessment & Plan Note (Signed)
 Reassuring digital rectal exam and PSA. Notes ongoing nocturia - discussed trial flomax - he will let me know if desired.

## 2023-09-11 NOTE — Assessment & Plan Note (Signed)
 Chronic, stable. Continue valsartan .

## 2023-09-11 NOTE — Assessment & Plan Note (Addendum)
 Continue aspirin , zetia , statin. Appreciate cardiology care.

## 2023-09-11 NOTE — Assessment & Plan Note (Addendum)
 Discussed overdue for colon cancer screening in h/o TA and SSP 2019 - he declines. He will let me know if he changes his mind.

## 2023-09-11 NOTE — Assessment & Plan Note (Signed)
Advanced directives - would want wife Aurther Loft to be HCPOA. Has this set up at home - asked to bring [place] copy.

## 2023-09-11 NOTE — Assessment & Plan Note (Addendum)
 Update fatigue labs today  ESS = 10. If labs unrevealing, consider HST for OSA evaluation given endorsed daytime somnolence, snoring, restless non-restorative sleep.

## 2023-09-11 NOTE — Assessment & Plan Note (Signed)

## 2023-09-11 NOTE — Assessment & Plan Note (Signed)
 Chronic, stable with nexium 20mg  daily OTC

## 2023-09-30 DIAGNOSIS — Z961 Presence of intraocular lens: Secondary | ICD-10-CM | POA: Diagnosis not present

## 2023-10-25 ENCOUNTER — Telehealth: Payer: Self-pay | Admitting: Family Medicine

## 2023-10-25 DIAGNOSIS — Z1211 Encounter for screening for malignant neoplasm of colon: Secondary | ICD-10-CM

## 2023-10-25 NOTE — Telephone Encounter (Signed)
 Spoke with pt at wife's appt. He requests new referral for colonoscopy to Dr Ole Berkeley at [facility] - he's spoken with Dr Ole Berkeley who was ok with referral for this.

## 2023-10-30 ENCOUNTER — Other Ambulatory Visit: Payer: Self-pay

## 2023-10-30 ENCOUNTER — Telehealth: Payer: Self-pay

## 2023-10-30 DIAGNOSIS — Z860101 Personal history of adenomatous and serrated colon polyps: Secondary | ICD-10-CM

## 2023-10-30 MED ORDER — NA SULFATE-K SULFATE-MG SULF 17.5-3.13-1.6 GM/177ML PO SOLN: 1.0000 | Freq: Once | ORAL | 0 refills | Status: AC

## 2023-10-30 NOTE — Telephone Encounter (Signed)
 Gastroenterology Pre-Procedure Review  Request Date: 12/10/23 Requesting Physician: Dr. Ole Berkeley  PATIENT REVIEW QUESTIONS: The patient responded to the following health history questions as indicated:    1. Are you having any GI issues? no 2. Do you have a personal history of Polyps? yes (last colonoscopy performed by at [facility] 04/11/18 recommended repeat iin 3 years) 3. Do you have a family history of Colon Cancer or Polyps? no 4. Diabetes Mellitus? no 5. Joint replacements in the past 12 months?no 6. Major health problems in the past 3 months?no 7. Any artificial heart valves, MVP, or defibrillator?no 8. Cardiac health? Yes-CAD and cardiomyopathy.  Clearance sent to CV Preop Team    MEDICATIONS & ALLERGIES:    Patient reports the following regarding taking any anticoagulation/antiplatelet therapy:   Plavix, Coumadin, Eliquis, Xarelto, Lovenox, Pradaxa, Brilinta, or Effient ? no Aspirin ? yes (81 mg daily)  Patient confirms/reports the following medications:  Current Outpatient Medications  Medication Sig Dispense Refill   Na Sulfate-K Sulfate-Mg Sulfate concentrate (SUPREP) 17.5-3.13-1.6 GM/177ML SOLN Take 1 kit (354 mLs total) by mouth once for 1 dose. 354 mL 0   ascorbic Acid  (VITAMIN C ) 500 MG CPCR Take 500 mg by mouth daily.     aspirin  EC 81 MG EC tablet Take 1 tablet (81 mg total) by mouth daily. 30 tablet 1   cyanocobalamin  (VITAMIN B12) 1000 MCG tablet Take 1 tablet (1,000 mcg total) by mouth daily.     esomeprazole (NEXIUM) 20 MG capsule Take 20 mg by mouth daily as needed. OTC     ezetimibe  (ZETIA ) 10 MG tablet TAKE 1 TABLET BY MOUTH EVERY DAY 90 tablet 3   nitroGLYCERIN  (NITROSTAT ) 0.4 MG SL tablet Place 1 tablet (0.4 mg total) under the tongue every 5 (five) minutes x 3 doses as needed for chest pain. 20 tablet 2   rosuvastatin  (CRESTOR ) 40 MG tablet TAKE 1 TABLET BY MOUTH EVERY DAY 90 tablet 3   valsartan  (DIOVAN ) 160 MG tablet TAKE 1 TABLET BY MOUTH EVERY DAY 90 tablet  3   No current facility-administered medications for this visit.    Patient confirms/reports the following allergies:  Allergies  Allergen Reactions   Lisinopril  Cough    No orders of the defined types were placed in this encounter.   AUTHORIZATION INFORMATION Primary Insurance: 1D#: Group #:  Secondary Insurance: 1D#: Group #:  SCHEDULE INFORMATION: Date: 12/10/23 Time: Location: [facility]

## 2023-10-31 ENCOUNTER — Telehealth: Payer: Self-pay | Admitting: Internal Medicine

## 2023-10-31 ENCOUNTER — Telehealth: Payer: Self-pay

## 2023-10-31 NOTE — Telephone Encounter (Signed)
   Pre-operative Risk Assessment    Patient Name: Martin Garza  DOB: 22-Mar-1951 MRN: [id number]   Date of last office visit: 01/16/23 [facility] END, MD Date of next office visit: NONE   Request for Surgical Clearance    Procedure:  COLONOSCOPY  Date of Surgery:  Clearance 12/10/23                                Surgeon:  DR Marnee Sink Surgeon's Group or Practice Name:  [facility] Phone number:  [phone] Fax number:  [phone]  ATTN: MICHELLE   Type of Clearance Requested:   - Medical  - Pharmacy:  Hold Aspirin      Type of Anesthesia:  General    Additional requests/questions:    SignedCollin Deal   10/31/2023, 1:20 PM

## 2023-10-31 NOTE — Telephone Encounter (Signed)
 Pt is requesting a callback regarding him wanting to speak about his wife. Please advise

## 2023-10-31 NOTE — Telephone Encounter (Signed)
 This call was in reference to his wife so encounter opened for her with all information discussed.

## 2023-10-31 NOTE — Telephone Encounter (Signed)
 Primary Cardiologist:Christopher End, MD   Preoperative team, please contact this patient and set up a phone call appointment for further preoperative risk assessment. Please obtain consent and complete medication review. Thank you for your help.   I confirm that guidance regarding antiplatelet and oral anticoagulation therapy has been completed and, if necessary, noted below.  Ideally aspirin  should be continued without interruption, however if the bleeding risk is too great, aspirin  may be held for 5-7 days prior to surgery. Please resume aspirin  post operatively when it is felt to be safe from a bleeding standpoint.   I also confirmed the patient resides in the state of [place] . As per [facility] Medical Board telemedicine laws, the patient must reside in the state in which the provider is licensed.   Gerldine Koch, NP-C  10/31/2023, 4:43 PM [address] [place], [place] [address] Office [phone] Fax [phone]

## 2023-10-31 NOTE — Telephone Encounter (Signed)
 Follow up on cardiac clearance.  Colonoscopy 12/10/23 with Dr. Ole Berkeley at [facility].

## 2023-11-01 ENCOUNTER — Telehealth: Payer: Self-pay | Admitting: *Deleted

## 2023-11-01 NOTE — Telephone Encounter (Signed)
 Pt has been scheduled tele preop appt 11/29/23. Med rec and consent are done.      Patient Consent for Virtual Visit        Martin Garza has provided verbal consent on 11/01/2023 for a virtual visit (video or telephone).   CONSENT FOR VIRTUAL VISIT FOR:  [name]  By participating in this virtual visit I agree to the following:  I hereby voluntarily request, consent and authorize [facility] and its employed or contracted physicians, physician assistants, nurse practitioners or other licensed health care professionals (the Practitioner), to provide me with telemedicine health care services (the "Services) as deemed necessary by the treating Practitioner. I acknowledge and consent to receive the Services by the Practitioner via telemedicine. I understand that the telemedicine visit will involve communicating with the Practitioner through live audiovisual communication technology and the disclosure of certain medical information by electronic transmission. I acknowledge that I have been given the opportunity to request an in-person assessment or other available alternative prior to the telemedicine visit and am voluntarily participating in the telemedicine visit.  I understand that I have the right to withhold or withdraw my consent to the use of telemedicine in the course of my care at any time, without affecting my right to future care or treatment, and that the Practitioner or I may terminate the telemedicine visit at any time. I understand that I have the right to inspect all information obtained and/or recorded in the course of the telemedicine visit and may receive copies of available information for a reasonable fee.  I understand that some of the potential risks of receiving the Services via telemedicine include:  Delay or interruption in medical evaluation due to technological equipment failure or disruption; Information transmitted may not be sufficient (e.g. poor  resolution of images) to allow for appropriate medical decision making by the Practitioner; and/or  In rare instances, security protocols could fail, causing a breach of personal health information.  Furthermore, I acknowledge that it is my responsibility to provide information about my medical history, conditions and care that is complete and accurate to the best of my ability. I acknowledge that Practitioner's advice, recommendations, and/or decision may be based on factors not within their control, such as incomplete or inaccurate data provided by me or distortions of diagnostic images or specimens that may result from electronic transmissions. I understand that the practice of medicine is not an exact science and that Practitioner makes no warranties or guarantees regarding treatment outcomes. I acknowledge that a copy of this consent can be made available to me via my patient portal [facility] MyChart), or I can request a printed copy by calling the office of [facility].    I understand that my insurance will be billed for this visit.   I have read or had this consent read to me. I understand the contents of this consent, which adequately explains the benefits and risks of the Services being provided via telemedicine.  I have been provided ample opportunity to ask questions regarding this consent and the Services and have had my questions answered to my satisfaction. I give my informed consent for the services to be provided through the use of telemedicine in my medical care

## 2023-11-01 NOTE — Telephone Encounter (Signed)
 Pt has been scheduled tele preop appt 11/29/23. Med rec and consent are done.

## 2023-11-29 ENCOUNTER — Ambulatory Visit: Attending: Internal Medicine

## 2023-11-29 DIAGNOSIS — Z01810 Encounter for preprocedural cardiovascular examination: Secondary | ICD-10-CM | POA: Diagnosis not present

## 2023-11-29 NOTE — Progress Notes (Signed)
 Virtual Visit via Telephone Note   Because of Martin Garza co-morbid illnesses, he is at least at moderate risk for complications without adequate follow up.  This format is felt to be most appropriate for this patient at this time.  Due to technical limitations with video connection (technology), today's appointment will be conducted as an audio only telehealth visit, and Martin Garza verbally agreed to proceed in this manner.   All issues noted in this document were discussed and addressed.  No physical exam could be performed with this format.  Evaluation Performed:  Preoperative cardiovascular risk assessment _____________   Date:  11/29/2023   Patient ID:  Martin, Garza 1950/12/13, MRN [id number] Patient Location:  Home Provider location:   Office  Primary Care Provider:  Rilla Baller, MD Primary Cardiologist:  Lonni Hanson, MD  Chief Complaint / Patient Profile  73 y.o. y/o male with a h/o CAD status post PCI to proximal LAD in setting of NSTEMI, hypertension, hyperlipidemia and ischemic cardiomyopathy who is pending colonoscopy and presents today for telephonic preoperative cardiovascular risk assessment. History of Present Illness  Martin Garza is a 73 y.o. male who presents via audio/video conferencing for a telehealth visit today.  Pt was last seen in cardiology clinic on 01/12/2023 by Dr. Hanson.  At that time Martin Garza was doing well, patient was noted to have chronic dyspnea on exertion that was unchanged.  The patient is now pending procedure as outlined above. Since his last visit, he has remained stable from a cardiac standpoint. Today he denies chest pain, shortness of breath, lower extremity edema, fatigue, palpitations, melena, hematuria, hemoptysis, diaphoresis, weakness, presyncope, syncope, orthopnea, and PND. He is able to achieve greater than 4 METs of activity.  Past Medical History    Past Medical History:  Diagnosis Date   Allergy     CAD (coronary artery disease)    a. 10/2018 NSTEMI/PCI: LM nl, LAD 95p (3.0x12 Resolute Onyx DES), 68m, 85d (2.0x18 Resolute Onyx DES), LCX nl, RCA large, 20ost, 30d.   Cataract    had surg in both eyes   Complication of anesthesia    Blood pressure goes up after waking up past sedation/ gets dizzy too per pt!   Essential hypertension    Hyperlipidemia LDL goal <70    Ischemic cardiomyopathy    a. 10/2018 Echo: EF 55-60%, sev apical septal, apical, and ant HK. DD. Nl RV fxn.   Medical history non-contributory    Retinal detachment 2014   left   Umbilical hernia    Past Surgical History:  Procedure Laterality Date   CARDIAC CATHETERIZATION     CATARACT EXTRACTION  2018   Bil   COLONOSCOPY  04/2018   TA, SSP, diverticulosis, rpt 3 yrs (Danis); with polypectom y   CORONARY STENT INTERVENTION N/A 11/10/2018   Procedure: CORONARY STENT INTERVENTION;  Surgeon: Hanson Lonni, MD;  Location: [facility] INVASIVE CV LAB;  Service: Cardiovascular;  Laterality: N/A;   LEFT HEART CATH AND CORONARY ANGIOGRAPHY N/A 11/10/2018   Procedure: LEFT HEART CATH AND CORONARY ANGIOGRAPHY;  Surgeon: Hanson Lonni, MD;  Location: [facility] INVASIVE CV LAB;  Service: Cardiovascular;  Laterality: N/A;   Left inguinal repair  in [employer]   RETINAL DETACHMENT SURGERY Left    x 2 in 2014   UMBILICAL HERNIA REPAIR N/A 05/30/2018   Procedure: LAPAROSCOPIC ASSISTED  UMBILICAL HERNIA REPAIR WITH MESH,  ERAS PATHWAY;  Surgeon: Lunger Cough, MD;  Location: [facility] ORS;  Service:  General;  Laterality: N/A;    Allergies  Allergies  Allergen Reactions   Lisinopril  Cough    Home Medications    Prior to Admission medications   Medication Sig Start Date End Date Taking? Authorizing Provider  ascorbic Acid  (VITAMIN C ) 500 MG CPCR Take 500 mg by mouth daily.    [provider]  aspirin  EC 81 MG EC tablet Take 1 tablet (81 mg total) by mouth daily. 11/12/18   Patel, Sona, MD  cyanocobalamin  (VITAMIN B12) 1000 MCG  tablet Take 1 tablet (1,000 mcg total) by mouth daily. 09/09/23   Rilla Baller, MD  esomeprazole (NEXIUM) 20 MG capsule Take 20 mg by mouth daily as needed. OTC    [provider]  ezetimibe  (ZETIA ) 10 MG tablet TAKE 1 TABLET BY MOUTH EVERY DAY 04/02/23   End, Lonni, MD  nitroGLYCERIN  (NITROSTAT ) 0.4 MG SL tablet Place 1 tablet (0.4 mg total) under the tongue every 5 (five) minutes x 3 doses as needed for chest pain. 03/07/21   Rilla Baller, MD  rosuvastatin  (CRESTOR ) 40 MG tablet TAKE 1 TABLET BY MOUTH EVERY DAY 04/01/23   End, Lonni, MD  valsartan  (DIOVAN ) 160 MG tablet TAKE 1 TABLET BY MOUTH EVERY DAY 04/01/23   End, Lonni, MD    Physical Exam   Vital Signs:  Martin Garza does not have vital signs available for review today. Given telephonic nature of communication, physical exam is limited. AAOx3. NAD. Normal affect.  Speech and respirations are unlabored. Accessory Clinical Findings  None Assessment & Plan    1.  Preoperative Cardiovascular Risk Assessment: Martin Garza perioperative risk of a major cardiac event is 6.6% according to the Revised Cardiac Risk Index (RCRI). His functional capacity is excellent at 8.97 METs according to the Duke Activity Status Index (DASI). Recommendations: According to ACC/AHA guidelines, no further cardiovascular testing needed.  The patient may proceed to surgery at acceptable risk.   Antiplatelet and/or Anticoagulation Recommendations: Ideally aspirin  should be continued without interruption, however if the bleeding risk is too great, aspirin  may be held for 5-7 days prior to surgery. Please resume aspirin  post operatively when it is felt to be safe from a bleeding standpoint.    The patient was advised that if he develops new symptoms prior to surgery to contact our office to arrange for a follow-up visit, and he verbalized understanding.  A copy of this note will be routed to requesting surgeon.  Time:    Today, I have spent 10 minutes with the patient with telehealth technology discussing medical history, symptoms, and management plan.    Audrena Talaga D Manas Hickling, NP  11/29/2023, 4:04 PM

## 2023-12-02 NOTE — Telephone Encounter (Signed)
 Preoperative Cardiovascular Risk Assessment: Martin Garza perioperative risk of a major cardiac event is 6.6% according to the Revised Cardiac Risk Index (RCRI). His functional capacity is excellent at 8.97 METs according to the Duke Activity Status Index (DASI). Recommendations: According to ACC/AHA guidelines, no further cardiovascular testing needed.  The patient may proceed to surgery at acceptable risk.   Antiplatelet and/or Anticoagulation Recommendations: Ideally aspirin  should be continued without interruption, however if the bleeding risk is too great, aspirin  may be held for 5-7 days prior to surgery. Please resume aspirin  post operatively when it is felt to be safe from a bleeding standpoint.    The patient was advised that if he develops new symptoms prior to surgery to contact our office to arrange for a follow-up visit, and he verbalized understanding.   A copy of this note will be routed to requesting surgeon.

## 2023-12-10 ENCOUNTER — Encounter
Admission: RE | Disposition: A | Discharge status: Home / Self Care | Payer: Self-pay | Source: Home / Self Care | Attending: Gastroenterology

## 2023-12-10 ENCOUNTER — Ambulatory Visit: Admitting: Anesthesiology

## 2023-12-10 ENCOUNTER — Other Ambulatory Visit: Payer: Self-pay

## 2023-12-10 ENCOUNTER — Ambulatory Visit
Admission: RE | Admit: 2023-12-10 | Discharge: 2023-12-10 | Disposition: A | Discharge status: Home / Self Care | Attending: Gastroenterology | Admitting: Gastroenterology

## 2023-12-10 ENCOUNTER — Encounter: Payer: Self-pay | Admitting: Gastroenterology

## 2023-12-10 DIAGNOSIS — I2510 Atherosclerotic heart disease of native coronary artery without angina pectoris: Secondary | ICD-10-CM | POA: Insufficient documentation

## 2023-12-10 DIAGNOSIS — Z860101 Personal history of adenomatous and serrated colon polyps: Secondary | ICD-10-CM

## 2023-12-10 DIAGNOSIS — D122 Benign neoplasm of ascending colon: Secondary | ICD-10-CM | POA: Insufficient documentation

## 2023-12-10 DIAGNOSIS — K219 Gastro-esophageal reflux disease without esophagitis: Secondary | ICD-10-CM | POA: Diagnosis not present

## 2023-12-10 DIAGNOSIS — D123 Benign neoplasm of transverse colon: Secondary | ICD-10-CM | POA: Diagnosis not present

## 2023-12-10 DIAGNOSIS — K635 Polyp of colon: Secondary | ICD-10-CM | POA: Diagnosis not present

## 2023-12-10 DIAGNOSIS — Z79899 Other long term (current) drug therapy: Secondary | ICD-10-CM | POA: Insufficient documentation

## 2023-12-10 DIAGNOSIS — K5730 Diverticulosis of large intestine without perforation or abscess without bleeding: Secondary | ICD-10-CM | POA: Insufficient documentation

## 2023-12-10 DIAGNOSIS — Z1211 Encounter for screening for malignant neoplasm of colon: Secondary | ICD-10-CM | POA: Insufficient documentation

## 2023-12-10 DIAGNOSIS — K641 Second degree hemorrhoids: Secondary | ICD-10-CM | POA: Diagnosis not present

## 2023-12-10 DIAGNOSIS — I10 Essential (primary) hypertension: Secondary | ICD-10-CM | POA: Diagnosis not present

## 2023-12-10 HISTORY — PX: POLYPECTOMY: SHX149

## 2023-12-10 HISTORY — PX: COLONOSCOPY: SHX5424

## 2023-12-10 SURGERY — COLONOSCOPY
Anesthesia: General

## 2023-12-10 MED ORDER — PROPOFOL 10 MG/ML IV BOLUS
INTRAVENOUS | Status: DC | PRN
  Administered 2023-12-10: 125 ug/kg/min via INTRAVENOUS
  Administered 2023-12-10: 80 mg via INTRAVENOUS

## 2023-12-10 MED ORDER — SODIUM CHLORIDE 0.9 % IV SOLN: INTRAVENOUS | Status: DC

## 2023-12-10 MED ORDER — LIDOCAINE HCL (CARDIAC) PF 100 MG/5ML IV SOSY
PREFILLED_SYRINGE | INTRAVENOUS | Status: DC | PRN
  Administered 2023-12-10: 50 mg via INTRAVENOUS

## 2023-12-10 NOTE — Anesthesia Preprocedure Evaluation (Signed)
 Anesthesia Evaluation  Patient identified by MRN, date of birth, ID band Patient awake    Reviewed: Allergy & Precautions, NPO status , Patient's Chart, lab work & pertinent test results  Airway Mallampati: II  TM Distance: >3 FB Neck ROM: full    Dental  (+) Partial Lower   Pulmonary neg pulmonary ROS   Pulmonary exam normal breath sounds clear to auscultation       Cardiovascular Exercise Tolerance: Good hypertension, Pt. on medications + CAD and + Cardiac Stents  negative cardio ROS Normal cardiovascular exam Rhythm:Regular Rate:Normal     Neuro/Psych negative neurological ROS  negative psych ROS   GI/Hepatic negative GI ROS, Neg liver ROS,GERD  Medicated,,  Endo/Other  negative endocrine ROS    Renal/GU negative Renal ROS  negative genitourinary   Musculoskeletal   Abdominal  (+) + obese  Peds negative pediatric ROS (+)  Hematology negative hematology ROS (+)   Anesthesia Other Findings Past Medical History: No date: Allergy No date: CAD (coronary artery disease)     Comment:  a. 10/2018 NSTEMI/PCI: LM nl, LAD 95p (3.0x12 Resolute               Onyx DES), 29m, 85d (2.0x18 Resolute Onyx DES), LCX nl,               RCA large, 20ost, 30d. No date: Cataract     Comment:  had surg in both eyes No date: Complication of anesthesia     Comment:  Blood pressure goes up after waking up past sedation/               gets dizzy too per pt! No date: Essential hypertension No date: Hyperlipidemia LDL goal <70 No date: Ischemic cardiomyopathy     Comment:  a. 10/2018 Echo: EF 55-60%, sev apical septal, apical,               and ant HK. DD. Nl RV fxn. No date: Medical history non-contributory 2014: Retinal detachment     Comment:  left No date: Umbilical hernia  Past Surgical History: No date: CARDIAC CATHETERIZATION 2018: CATARACT EXTRACTION     Comment:  Bil 04/2018: COLONOSCOPY     Comment:  TA, SSP,  diverticulosis, rpt 3 yrs (Danis); with               polypectom y 11/10/2018: CORONARY STENT INTERVENTION; N/A     Comment:  Procedure: CORONARY STENT INTERVENTION;  Surgeon: Mady Bruckner, MD;  Location: [facility] INVASIVE CV LAB;                Service: Cardiovascular;  Laterality: N/A; 11/10/2018: LEFT HEART CATH AND CORONARY ANGIOGRAPHY; N/A     Comment:  Procedure: LEFT HEART CATH AND CORONARY ANGIOGRAPHY;                Surgeon: Mady Bruckner, MD;  Location: [facility] INVASIVE               CV LAB;  Service: Cardiovascular;  Laterality: N/A; in High School: Left inguinal repair No date: RETINAL DETACHMENT SURGERY; Left     Comment:  x 2 in 2014 05/30/2018: UMBILICAL HERNIA REPAIR; N/A     Comment:  Procedure: LAPAROSCOPIC ASSISTED  UMBILICAL HERNIA               REPAIR WITH MESH,  ERAS PATHWAY;  Surgeon: Gladis,  Donnice, MD;  Location: [facility];  Service: General;                Laterality: N/A;     Reproductive/Obstetrics negative OB ROS                              Anesthesia Physical Anesthesia Plan  ASA: 3  Anesthesia Plan: General   Post-op Pain Management:    Induction: Intravenous  PONV Risk Score and Plan: Propofol  infusion and TIVA  Airway Management Planned: Natural Airway and Nasal Cannula  Additional Equipment:   Intra-op Plan:   Post-operative Plan:   Informed Consent: I have reviewed the patients History and Physical, chart, labs and discussed the procedure including the risks, benefits and alternatives for the proposed anesthesia with the patient or authorized representative who has indicated his/her understanding and acceptance.     Dental Advisory Given  Plan Discussed with: CRNA  Anesthesia Plan Comments:         Anesthesia Quick Evaluation

## 2023-12-10 NOTE — Anesthesia Procedure Notes (Signed)
 Date/Time: 12/10/2023 10:40 AM  Performed by: Dominica Krabbe, CRNAPre-anesthesia Checklist: Patient identified, Emergency Drugs available, Suction available, Patient being monitored and Timeout performed Patient Re-evaluated:Patient Re-evaluated prior to induction Oxygen Delivery Method: Nasal cannula Preoxygenation: Pre-oxygenation with 100% oxygen Induction Type: IV induction

## 2023-12-10 NOTE — Op Note (Signed)
 [facility] Gastroenterology Patient Name: Martin Garza Procedure Date: 12/10/2023 10:38 AM MRN: [id number] Account #: [id number] Date of Birth: Apr 19, 1951 Admit Type: Outpatient Age: 73 Room: [facility] ENDO ROOM 4 Gender: Male Note Status: Finalized Instrument Name: Arvis [id number] Procedure:             Colonoscopy Indications:           High risk colon cancer surveillance: Personal history                         of colonic polyps Providers:             [name] MD, MD Referring MD:          Anton Blas (Referring MD) Medicines:             Propofol  per Anesthesia Complications:         No immediate complications. Procedure:             Pre-Anesthesia Assessment:                        - Prior to the procedure, a History and Physical was                         performed, and patient medications and allergies were                         reviewed. The patient's tolerance of previous                         anesthesia was also reviewed. The risks and benefits                         of the procedure and the sedation options and risks                         were discussed with the patient. All questions were                         answered, and informed consent was obtained. Prior                         Anticoagulants: The patient has taken no anticoagulant                         or antiplatelet agents. ASA Grade Assessment: II - A                         patient with mild systemic disease. After reviewing                         the risks and benefits, the patient was deemed in                         satisfactory condition to undergo the procedure.                        After obtaining informed consent, the colonoscope was  passed under direct vision. Throughout the procedure,                         the patient's blood pressure, pulse, and oxygen                         saturations were monitored continuously. The                          Colonoscope was introduced through the anus and                         advanced to the the cecum, identified by appendiceal                         orifice and ileocecal valve. The colonoscopy was                         performed without difficulty. The patient tolerated                         the procedure well. The quality of the bowel                         preparation was excellent. Findings:      The perianal and digital rectal examinations were normal.      A 2 mm polyp was found in the ascending colon. The polyp was sessile.       The polyp was removed with a cold biopsy forceps. Resection and       retrieval were complete.      Three sessile polyps were found in the transverse colon. The polyps were       2 to 3 mm in size. These polyps were removed with a cold biopsy forceps.       Resection and retrieval were complete.      A few small-mouthed diverticula were found in the sigmoid colon.      Non-bleeding internal hemorrhoids were found during retroflexion. The       hemorrhoids were Grade II (internal hemorrhoids that prolapse but reduce       spontaneously). Impression:            - One 2 mm polyp in the ascending colon, removed with                         a cold biopsy forceps. Resected and retrieved.                        - Three 2 to 3 mm polyps in the transverse colon,                         removed with a cold biopsy forceps. Resected and                         retrieved.                        - Diverticulosis in the sigmoid colon.                        -  Non-bleeding internal hemorrhoids. Recommendation:        - Discharge patient to home.                        - Resume previous diet.                        - Continue present medications.                        - Await pathology results. Procedure Code(s):     --- Professional ---                        [phone], Colonoscopy, flexible; with biopsy, single or                          multiple Diagnosis Code(s):     --- Professional ---                        Z86.010, Personal history of colonic polyps                        D12.3, Benign neoplasm of transverse colon (hepatic                         flexure or splenic flexure) CPT copyright 2022 American Medical Association. All rights reserved. The codes documented in this report are preliminary and upon coder review may  be revised to meet current compliance requirements. [name] MD, MD 12/10/2023 10:58:12 AM This report has been signed electronically. Number of Addenda: 0 Note Initiated On: 12/10/2023 10:38 AM Scope Withdrawal Time: 0 hours 8 minutes 43 seconds  Total Procedure Duration: 0 hours 11 minutes 19 seconds  Estimated Blood Loss:  Estimated blood loss: none. Estimated blood loss: none.      [facility]

## 2023-12-10 NOTE — Anesthesia Postprocedure Evaluation (Signed)
 Anesthesia Post Note  Patient: Martin Garza  Procedure(s) Performed: COLONOSCOPY POLYPECTOMY, INTESTINE  Patient location during evaluation: PACU Anesthesia Type: General Level of consciousness: awake and awake and alert Pain management: satisfactory to patient Vital Signs Assessment: post-procedure vital signs reviewed and stable Respiratory status: spontaneous breathing Cardiovascular status: stable Anesthetic complications: no   No notable events documented.   Last Vitals:  Vitals:   12/10/23 1101 12/10/23 1102  BP: 113/65 113/65  Pulse: (!) 56 (!) 56  Resp: 14 16  Temp: (!) 35.7 C   SpO2: 97% 97%    Last Pain:  Vitals:   12/10/23 1101  TempSrc: Temporal  PainSc: Asleep                 VAN STAVEREN,Lundy Cozart

## 2023-12-10 NOTE — Transfer of Care (Signed)
 Immediate Anesthesia Transfer of Care Note  Patient: Martin Garza  Procedure(s) Performed: COLONOSCOPY POLYPECTOMY, INTESTINE  Patient Location: Endoscopy Unit  Anesthesia Type:General  Level of Consciousness: sedated and drowsy  Airway & Oxygen Therapy: Patient Spontanous Breathing  Post-op Assessment: Report given to RN and Post -op Vital signs reviewed and stable  Post vital signs: Reviewed and stable  Last Vitals:  Vitals Value Taken Time  BP 113/65 12/10/23 11:02  Temp 35.7 C 12/10/23 11:01  Pulse 56 12/10/23 11:02  Resp 16 12/10/23 11:02  SpO2 97 % 12/10/23 11:02    Last Pain:  Vitals:   12/10/23 1101  TempSrc: Temporal  PainSc: Asleep         Complications: No notable events documented.

## 2023-12-10 NOTE — H&P (Signed)
 [name], MD [facility] [address], [address] [place], [place] [address] Phone:[phone] Fax : [phone]  Primary Care Physician:  Rilla Baller, MD Primary Gastroenterologist:  Dr. Copping  Pre-Procedure History & Physical: HPI:  Martin Garza is a 73 y.o. male is here for an colonoscopy.   Past Medical History:  Diagnosis Date   Allergy    CAD (coronary artery disease)    a. 10/2018 NSTEMI/PCI: LM nl, LAD 95p (3.0x12 Resolute Onyx DES), 100m, 85d (2.0x18 Resolute Onyx DES), LCX nl, RCA large, 20ost, 30d.   Cataract    had surg in both eyes   Complication of anesthesia    Blood pressure goes up after waking up past sedation/ gets dizzy too per pt!   Essential hypertension    Hyperlipidemia LDL goal <70    Ischemic cardiomyopathy    a. 10/2018 Echo: EF 55-60%, sev apical septal, apical, and ant HK. DD. Nl RV fxn.   Medical history non-contributory    Retinal detachment 2014   left   Umbilical hernia     Past Surgical History:  Procedure Laterality Date   CARDIAC CATHETERIZATION     CATARACT EXTRACTION  2018   Bil   COLONOSCOPY  04/2018   TA, SSP, diverticulosis, rpt 3 yrs (Danis); with polypectom y   CORONARY STENT INTERVENTION N/A 11/10/2018   Procedure: CORONARY STENT INTERVENTION;  Surgeon: Mady Bruckner, MD;  Location: [facility] INVASIVE CV LAB;  Service: Cardiovascular;  Laterality: N/A;   LEFT HEART CATH AND CORONARY ANGIOGRAPHY N/A 11/10/2018   Procedure: LEFT HEART CATH AND CORONARY ANGIOGRAPHY;  Surgeon: Mady Bruckner, MD;  Location: [facility] INVASIVE CV LAB;  Service: Cardiovascular;  Laterality: N/A;   Left inguinal repair  in High School   RETINAL DETACHMENT SURGERY Left    x 2 in 2014   UMBILICAL HERNIA REPAIR N/A 05/30/2018   Procedure: LAPAROSCOPIC ASSISTED  UMBILICAL HERNIA REPAIR WITH MESH,  ERAS PATHWAY;  Surgeon: Lunger Cough, MD;  Location: [facility] ORS;  Service: General;  Laterality: N/A;    Prior to Admission medications   Medication Sig Start  Date End Date Taking? Authorizing Provider  ascorbic Acid  (VITAMIN C ) 500 MG CPCR Take 500 mg by mouth daily.   Yes [provider]  aspirin  EC 81 MG EC tablet Take 1 tablet (81 mg total) by mouth daily. 11/12/18  Yes Patel, Sona, MD  cyanocobalamin  (VITAMIN B12) 1000 MCG tablet Take 1 tablet (1,000 mcg total) by mouth daily. 09/09/23  Yes Rilla Baller, MD  rosuvastatin  (CRESTOR ) 40 MG tablet TAKE 1 TABLET BY MOUTH EVERY DAY 04/01/23  Yes End, Bruckner, MD  valsartan  (DIOVAN ) 160 MG tablet TAKE 1 TABLET BY MOUTH EVERY DAY 04/01/23  Yes End, Bruckner, MD  esomeprazole (NEXIUM) 20 MG capsule Take 20 mg by mouth daily as needed. OTC    [provider]  ezetimibe  (ZETIA ) 10 MG tablet TAKE 1 TABLET BY MOUTH EVERY DAY 04/02/23   End, Bruckner, MD  nitroGLYCERIN  (NITROSTAT ) 0.4 MG SL tablet Place 1 tablet (0.4 mg total) under the tongue every 5 (five) minutes x 3 doses as needed for chest pain. 03/07/21   Rilla Baller, MD    Allergies as of 10/30/2023 - Review Complete 09/11/2023  Allergen Reaction Noted   Lisinopril  Cough 11/25/2019    Family History  Problem Relation Age of Onset   Cancer Mother 41       metastatic cancer, smoker   Coronary artery disease Father 75  MI with CABG, smoker   Stroke Father        after catheterization   Alcohol abuse Father    Alcohol abuse Brother    Coronary artery disease Brother 72       CABG, smoker   Depression Brother    Drug abuse Brother    Hearing loss Daughter    Cancer Maternal Aunt        breast   Breast cancer Maternal Aunt    Diabetes Neg Hx    Colon cancer Neg Hx    Rectal cancer Neg Hx    Stomach cancer Neg Hx     Social History   Socioeconomic History   Marital status: Married    Spouse name: Not on file   Number of children: 1   Years of education: Not on file   Highest education level: Not on file  Occupational History   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  one grown daughterOccupation: Dispensing optician: no regular exercise.  yardwork.Diet: some water, fruits/vegetables daily, avoids fried foods   Social Drivers of Corporate investment banker Strain: Low Risk  (07/10/2022)   Overall Financial Resource Strain (CARDIA)    Difficulty of Paying Living Expenses: Not hard at all  Food Insecurity: No Food Insecurity (07/10/2022)   Hunger Vital Sign    Worried About Running Out of Food in the Last Year: Never true    Ran Out of Food in the Last Year: Never true  Transportation Needs: No Transportation Needs (07/10/2022)   PRAPARE - Administrator, Civil Service (Medical): No    Lack of Transportation (Non-Medical): No  Physical Activity: Inactive (07/10/2022)   Exercise Vital Sign    Days of Exercise per Week: 0 days    Minutes of Exercise per Session: 0 min  Stress: No Stress Concern Present (07/10/2022)   [employer] of Occupational Health - Occupational Stress Questionnaire    Feeling of Stress : Not at all  Social Connections: Not on file  Intimate Partner Violence: Not At Risk (07/10/2022)   Humiliation, Afraid, Rape, and Kick questionnaire    Fear of Current or Ex-Partner: No    Emotionally Abused: No    Physically Abused: No    Sexually Abused: No    Review of Systems: See HPI, otherwise negative ROS  Physical Exam: There were no vitals taken for this visit. General:   Alert,  pleasant and cooperative in  NAD Head:  Normocephalic and atraumatic. Neck:  Supple; no masses or thyromegaly. Lungs:  Clear throughout to auscultation.    Heart:  Regular rate and rhythm. Abdomen:  Soft, nontender and nondistended. Normal bowel sounds, without guarding, and without rebound.   Neurologic:  Alert and  oriented x4;  grossly normal neurologically.  Impression/Plan: JOSHAU CODE is here for an colonoscopy  to be performed for a history of adenomatous polyps on 2019   Risks, benefits, limitations, and alternatives regarding  colonoscopy have been reviewed with the patient.  Questions have been answered.  All parties agreeable.   [name], MD  12/10/2023, 9:51 AM

## 2023-12-11 ENCOUNTER — Encounter: Payer: Self-pay | Admitting: Gastroenterology

## 2023-12-12 ENCOUNTER — Ambulatory Visit: Payer: Self-pay | Admitting: Gastroenterology

## 2023-12-12 LAB — SURGICAL PATHOLOGY

## 2023-12-13 ENCOUNTER — Encounter: Payer: Self-pay | Admitting: Family Medicine

## 2024-01-22 ENCOUNTER — Ambulatory Visit: Attending: Internal Medicine | Admitting: Internal Medicine

## 2024-01-22 ENCOUNTER — Encounter: Payer: Self-pay | Admitting: Internal Medicine

## 2024-01-22 VITALS — BP 116/60 | HR 76 | Ht 71.0 in | Wt 203.8 lb

## 2024-01-22 DIAGNOSIS — R0989 Other specified symptoms and signs involving the circulatory and respiratory systems: Secondary | ICD-10-CM

## 2024-01-22 DIAGNOSIS — M79606 Pain in leg, unspecified: Secondary | ICD-10-CM

## 2024-01-22 DIAGNOSIS — Z79899 Other long term (current) drug therapy: Secondary | ICD-10-CM | POA: Diagnosis not present

## 2024-01-22 DIAGNOSIS — R202 Paresthesia of skin: Secondary | ICD-10-CM

## 2024-01-22 DIAGNOSIS — I2510 Atherosclerotic heart disease of native coronary artery without angina pectoris: Secondary | ICD-10-CM

## 2024-01-22 DIAGNOSIS — I25118 Atherosclerotic heart disease of native coronary artery with other forms of angina pectoris: Secondary | ICD-10-CM

## 2024-01-22 DIAGNOSIS — R0609 Other forms of dyspnea: Secondary | ICD-10-CM

## 2024-01-22 NOTE — Patient Instructions (Signed)
 Medication Instructions:  Your physician recommends that you continue on your current medications as directed. Please refer to the Current Medication list given to you today.    *If you need a refill on your cardiac medications before your next appointment, please call your pharmacy*  Lab Work: Your provider would like for you to have following labs drawn today A1C.     Testing/Procedures: Your physician has requested that you have an ankle brachial index (ABI). During this test an ultrasound and blood pressure cuff are used to evaluate the arteries that supply the arms and legs with blood.  Allow thirty minutes for this exam.  There are no restrictions or special instructions.  This will take place at [address] [facility] [address], [place] [address]  Please note: We ask at that you not bring children with you during ultrasound (echo/ vascular) testing. Due to room size and safety concerns, children are not allowed in the ultrasound rooms during exams. Our front office staff cannot provide observation of children in our lobby area while testing is being conducted. An adult accompanying a patient to their appointment will only be allowed in the ultrasound room at the discretion of the ultrasound technician under special circumstances. We apologize for any inconvenience.   Follow-Up: At [facility], you and your health needs are our priority.  As part of our continuing mission to provide you with exceptional heart care, our providers are all part of one team.  This team includes your primary Cardiologist (physician) and Advanced Practice Providers or APPs (Physician Assistants and Nurse Practitioners) who all work together to provide you with the care you need, when you need it.  Your next appointment:   1 year(s)  Provider:   You may see Lonni Hanson, MD or one of the following Advanced Practice Providers on your designated Care Team:   Lonni Meager,  NP Lesley Maffucci, PA-C Bernardino Bring, PA-C Cadence Gordon, PA-C Tylene Lunch, NP Barnie Hila, NP

## 2024-01-22 NOTE — Progress Notes (Unsigned)
  Cardiology Office Note:  .   Date:  01/23/2024  ID:  Martin Garza, DOB 07-25-1950, MRN [id number] PCP: Rilla Baller, MD  [facility] Providers Cardiologist:  Lonni Hanson, MD     History of Present Illness: .   Martin Garza is a 73 y.o. male with history of coronary artery disease status post PCI to proximal LAD in the setting of NSTEMI (complicated by loss of flow in the distal LAD), hypertension, hyperlipidemia, and ischemic cardiomyopathy, who presents for follow-up of coronary artery disease.  I last saw him a year ago, which time he was feeling well from a heart standpoint.  Chronic exertional dyspnea was stable.  He was concerned about low back pain and paresthesias, prompting us  to undertake a statin holiday.  Today, Martin Garza reports that he has been doing fairly well.  He has lost some weight and notes that his breathing has improved.  He denies chest pain, palpitations, lightheadedness, and edema.  His low back and leg pain attributed to sciatica has improved.  He complains of some paresthesias in his feet, usually when he lies down at night.  Home blood pressures are typically well-controlled in the 120s/70s.  ROS: See HPI  Studies Reviewed: SABRA   EKG Interpretation Date/Time:  Wednesday January 22 2024 14:47:36 EDT Ventricular Rate:  76 PR Interval:  188 QRS Duration:  76 QT Interval:  380 QTC Calculation: 427 R Axis:   -35  Text Interpretation: Normal sinus rhythm Left axis deviation Abnormal ECG When compared with ECG of 16-Jan-2023 15:59, QRS axis has changed Confirmed by Donnavin Vandenbrink [phone]) on 01/23/2024 4:15:51 PM    Risk Assessment/Calculations:             Physical Exam:   VS:  BP 116/60   Pulse 76   Ht 5' 11 (1.803 m)   Wt 203 lb 12.8 oz (92.4 kg)   SpO2 94%   BMI 28.42 kg/m    Wt Readings from Last 3 Encounters:  01/22/24 203 lb 12.8 oz (92.4 kg)  12/10/23 200 lb (90.7 kg)  09/09/23 203 lb 6 oz (92.3 kg)    General:   NAD. Neck: No JVD or HJR. Lungs: Clear to auscultation bilaterally without wheezes or crackles. Heart: Regular rate and rhythm without murmurs, rubs, or gallops. Abdomen: Soft, nontender, nondistended. Extremities: No lower extremity edema.  2+ left posterior tibial and dorsalis pedis pulses; 1+ right dorsalis pedis pulse.  2+ right posterior tibial pulse.  ASSESSMENT AND PLAN: .    Coronary artery disease and hyperlipidemia: No angina reported.  Continue secondary prevention with aspirin , ezetimibe , and rosuvastatin .  LDL well-controlled on last check at 52 in 08/2023.  Paresthesias and decreased pedal pulses: Martin Garza notes some numbness in his feet, most noticeable at night.  I will check hemoglobin A1c to evaluate for evidence of diabetes/prediabetes.  Given that pedal pulses are mildly diminished in the right foot, we will also obtain ABIs.  Dyspnea on exertion: Breathing is actually gotten better with weight loss.  No evidence of heart failure by exam.  No further testing recommended at this time.  I encouraged Martin Garza to remain active and to keep working on weight loss.    Dispo: Return to clinic in 1 year.  Signed, Lonni Hanson, MD

## 2024-01-23 ENCOUNTER — Encounter: Payer: Self-pay | Admitting: Internal Medicine

## 2024-01-23 DIAGNOSIS — R0989 Other specified symptoms and signs involving the circulatory and respiratory systems: Secondary | ICD-10-CM | POA: Insufficient documentation

## 2024-01-23 LAB — HEMOGLOBIN A1C
Est. average glucose Bld gHb Est-mCnc: 120 mg/dL
Hgb A1c MFr Bld: 5.8 % — ABNORMAL HIGH (ref 4.8–5.6)

## 2024-01-24 ENCOUNTER — Ambulatory Visit: Payer: Self-pay | Admitting: Internal Medicine

## 2024-03-05 ENCOUNTER — Ambulatory Visit: Attending: Internal Medicine

## 2024-03-05 DIAGNOSIS — R0989 Other specified symptoms and signs involving the circulatory and respiratory systems: Secondary | ICD-10-CM | POA: Diagnosis not present

## 2024-03-05 DIAGNOSIS — R202 Paresthesia of skin: Secondary | ICD-10-CM

## 2024-03-05 LAB — VAS US ABI WITH/WO TBI
Left ABI: 1.2
Right ABI: 1.25

## 2024-03-27 ENCOUNTER — Other Ambulatory Visit: Payer: Self-pay | Admitting: Internal Medicine

## 2024-03-27 DIAGNOSIS — E785 Hyperlipidemia, unspecified: Secondary | ICD-10-CM

## 2024-03-27 DIAGNOSIS — I25118 Atherosclerotic heart disease of native coronary artery with other forms of angina pectoris: Secondary | ICD-10-CM

## 2024-03-27 DIAGNOSIS — I10 Essential (primary) hypertension: Secondary | ICD-10-CM

## 2024-09-10 ENCOUNTER — Other Ambulatory Visit

## 2024-09-14 ENCOUNTER — Encounter: Admitting: Family Medicine
